# Patient Record
Sex: Female | Born: 1965 | Race: Black or African American | Hispanic: No | Smoking: Former smoker
Health system: Southern US, Community
[De-identification: ages and names within clinical notes are randomized; demographics above are authoritative.]

## PROBLEM LIST (undated history)

## (undated) DIAGNOSIS — T7840XA Allergy, unspecified, initial encounter: Secondary | ICD-10-CM

## (undated) DIAGNOSIS — D219 Benign neoplasm of connective and other soft tissue, unspecified: Secondary | ICD-10-CM

## (undated) DIAGNOSIS — F329 Major depressive disorder, single episode, unspecified: Secondary | ICD-10-CM

## (undated) DIAGNOSIS — R87619 Unspecified abnormal cytological findings in specimens from cervix uteri: Secondary | ICD-10-CM

## (undated) DIAGNOSIS — G43909 Migraine, unspecified, not intractable, without status migrainosus: Secondary | ICD-10-CM

## (undated) DIAGNOSIS — R569 Unspecified convulsions: Secondary | ICD-10-CM

## (undated) DIAGNOSIS — R011 Cardiac murmur, unspecified: Secondary | ICD-10-CM

## (undated) DIAGNOSIS — F32A Depression, unspecified: Secondary | ICD-10-CM

## (undated) DIAGNOSIS — IMO0002 Reserved for concepts with insufficient information to code with codable children: Secondary | ICD-10-CM

## (undated) DIAGNOSIS — F410 Panic disorder [episodic paroxysmal anxiety] without agoraphobia: Secondary | ICD-10-CM

## (undated) HISTORY — DX: Unspecified abnormal cytological findings in specimens from cervix uteri: R87.619

## (undated) HISTORY — DX: Cardiac murmur, unspecified: R01.1

## (undated) HISTORY — PX: APPENDECTOMY: SHX54

## (undated) HISTORY — DX: Panic disorder (episodic paroxysmal anxiety): F41.0

## (undated) HISTORY — DX: Reserved for concepts with insufficient information to code with codable children: IMO0002

## (undated) HISTORY — DX: Benign neoplasm of connective and other soft tissue, unspecified: D21.9

## (undated) HISTORY — PX: BREAST BIOPSY: SHX20

## (undated) HISTORY — DX: Major depressive disorder, single episode, unspecified: F32.9

## (undated) HISTORY — DX: Unspecified convulsions: R56.9

## (undated) HISTORY — DX: Allergy, unspecified, initial encounter: T78.40XA

## (undated) HISTORY — DX: Depression, unspecified: F32.A

## (undated) HISTORY — PX: BREAST CYST ASPIRATION: SHX578

---

## 1997-05-16 ENCOUNTER — Ambulatory Visit (HOSPITAL_BASED_OUTPATIENT_CLINIC_OR_DEPARTMENT_OTHER): Admission: RE | Admit: 1997-05-16 | Discharge: 1997-05-16 | Payer: Self-pay | Admitting: Ophthalmology

## 1999-02-20 ENCOUNTER — Encounter (INDEPENDENT_AMBULATORY_CARE_PROVIDER_SITE_OTHER): Payer: Self-pay | Admitting: Specialist

## 1999-02-20 ENCOUNTER — Other Ambulatory Visit: Admission: RE | Admit: 1999-02-20 | Discharge: 1999-02-20 | Payer: Self-pay | Admitting: Obstetrics and Gynecology

## 2000-07-07 ENCOUNTER — Other Ambulatory Visit: Admission: RE | Admit: 2000-07-07 | Discharge: 2000-07-07 | Payer: Self-pay | Admitting: Obstetrics and Gynecology

## 2002-06-04 ENCOUNTER — Other Ambulatory Visit: Admission: RE | Admit: 2002-06-04 | Discharge: 2002-06-04 | Payer: Self-pay | Admitting: Obstetrics and Gynecology

## 2003-10-18 ENCOUNTER — Other Ambulatory Visit: Admission: RE | Admit: 2003-10-18 | Discharge: 2003-10-18 | Payer: Self-pay | Admitting: Obstetrics and Gynecology

## 2003-10-21 ENCOUNTER — Encounter: Admission: RE | Admit: 2003-10-21 | Discharge: 2003-10-21 | Payer: Self-pay | Admitting: Obstetrics and Gynecology

## 2004-02-26 ENCOUNTER — Encounter: Admission: RE | Admit: 2004-02-26 | Discharge: 2004-02-26 | Payer: Self-pay | Admitting: Internal Medicine

## 2004-10-23 ENCOUNTER — Observation Stay (HOSPITAL_COMMUNITY): Admission: EM | Admit: 2004-10-23 | Discharge: 2004-10-24 | Payer: Self-pay | Admitting: Emergency Medicine

## 2004-10-23 ENCOUNTER — Ambulatory Visit: Payer: Self-pay | Admitting: Infectious Diseases

## 2004-11-06 ENCOUNTER — Other Ambulatory Visit: Admission: RE | Admit: 2004-11-06 | Discharge: 2004-11-06 | Payer: Self-pay | Admitting: Obstetrics and Gynecology

## 2005-01-12 ENCOUNTER — Encounter: Admission: RE | Admit: 2005-01-12 | Discharge: 2005-01-12 | Payer: Self-pay | Admitting: Obstetrics and Gynecology

## 2005-05-25 ENCOUNTER — Emergency Department (HOSPITAL_COMMUNITY): Admission: EM | Admit: 2005-05-25 | Discharge: 2005-05-25 | Payer: Self-pay | Admitting: Emergency Medicine

## 2005-08-24 ENCOUNTER — Emergency Department (HOSPITAL_COMMUNITY): Admission: EM | Admit: 2005-08-24 | Discharge: 2005-08-24 | Payer: Self-pay | Admitting: Emergency Medicine

## 2006-01-19 ENCOUNTER — Encounter: Admission: RE | Admit: 2006-01-19 | Discharge: 2006-01-19 | Payer: Self-pay | Admitting: Obstetrics and Gynecology

## 2006-06-20 ENCOUNTER — Emergency Department (HOSPITAL_COMMUNITY): Admission: EM | Admit: 2006-06-20 | Discharge: 2006-06-20 | Payer: Self-pay | Admitting: Emergency Medicine

## 2006-06-22 ENCOUNTER — Ambulatory Visit: Payer: Self-pay | Admitting: Family Medicine

## 2006-06-23 ENCOUNTER — Ambulatory Visit: Payer: Self-pay | Admitting: *Deleted

## 2006-08-10 ENCOUNTER — Ambulatory Visit: Payer: Self-pay | Admitting: Internal Medicine

## 2006-08-15 ENCOUNTER — Ambulatory Visit: Payer: Self-pay | Admitting: Internal Medicine

## 2006-08-23 ENCOUNTER — Encounter: Admission: RE | Admit: 2006-08-23 | Discharge: 2006-08-23 | Payer: Self-pay | Admitting: Obstetrics and Gynecology

## 2006-09-13 IMAGING — CR DG CHEST 2V
2 series · 2 of 2 positions shown · non-contrast
Comparison: none

CLINICAL DATA: Pain and tightness left shoulder with numbness. 
CHEST X-RAY:
Two views of the chest were obtained with nipple markers present.   nodular opacity wasquestioned on prior outside film at a lung base .No definite lung nodule is seen.  No active infiltrate or effusion is seen.  The heart is within normal limits in size.

[view not recorded (1 of 2)]
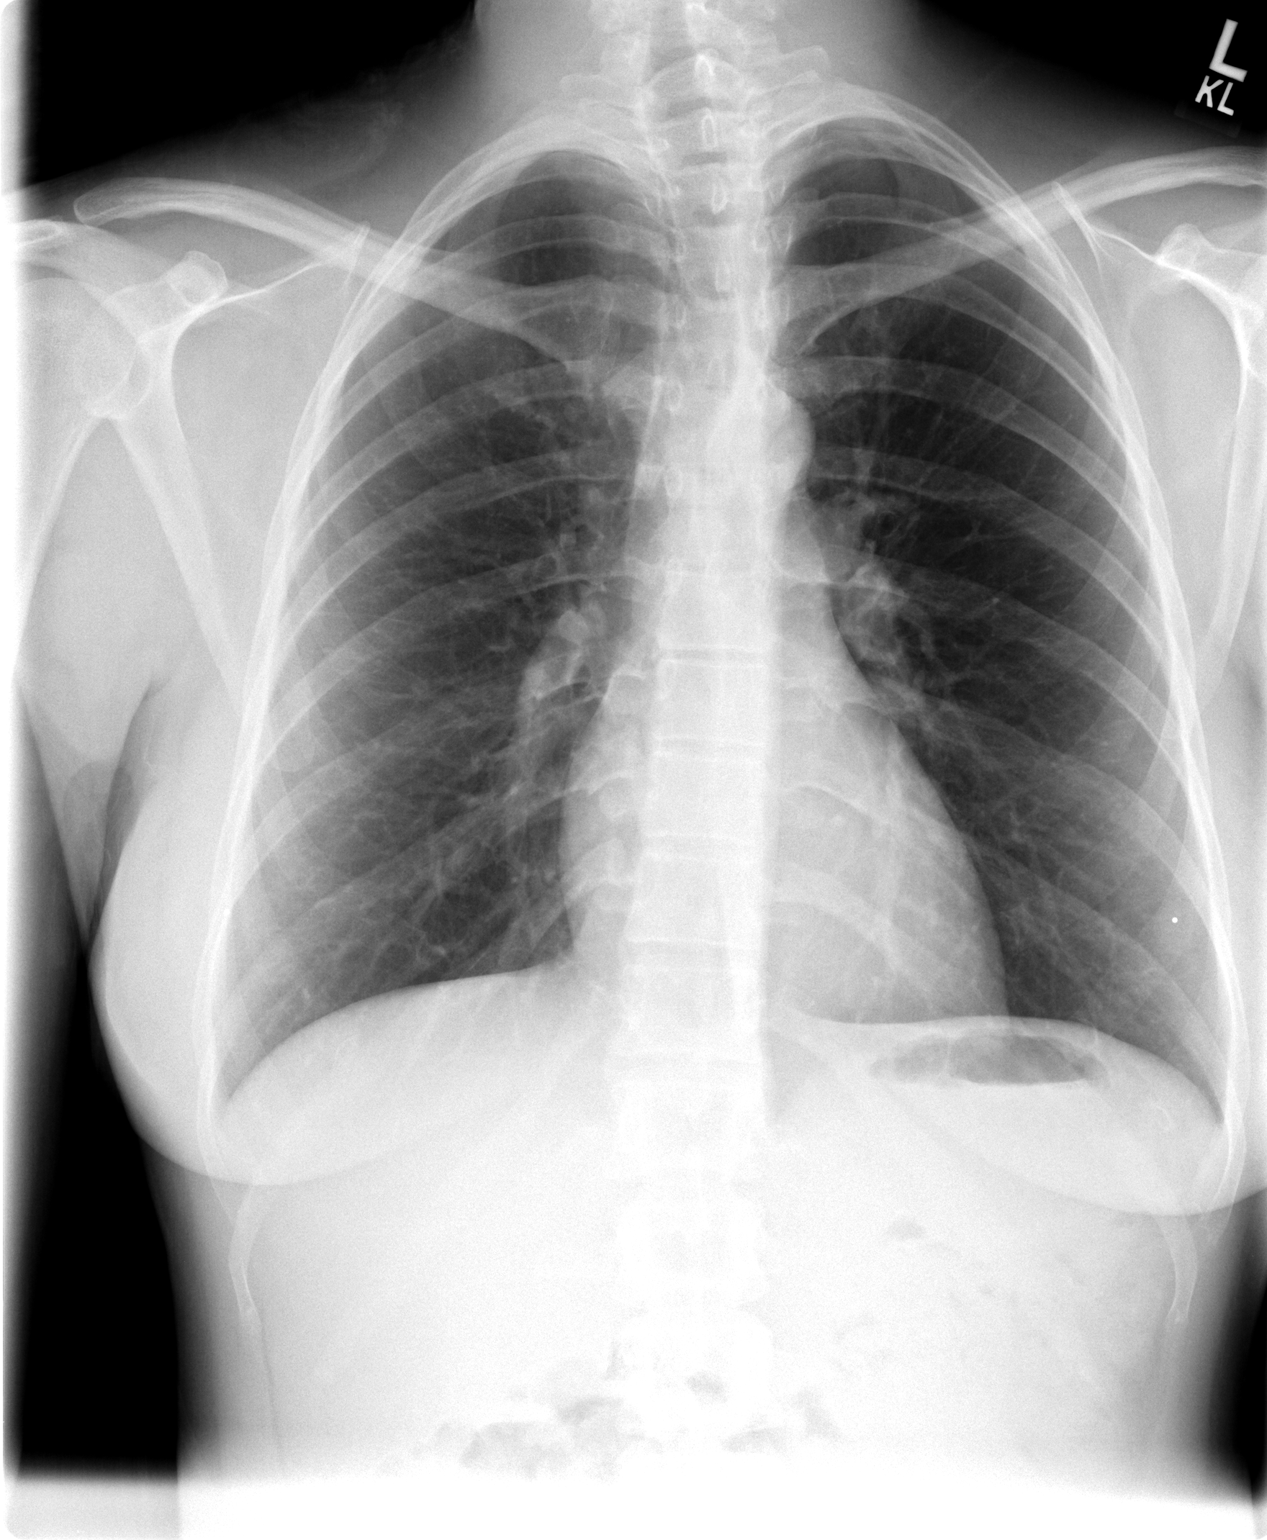

[view not recorded (2 of 2)]
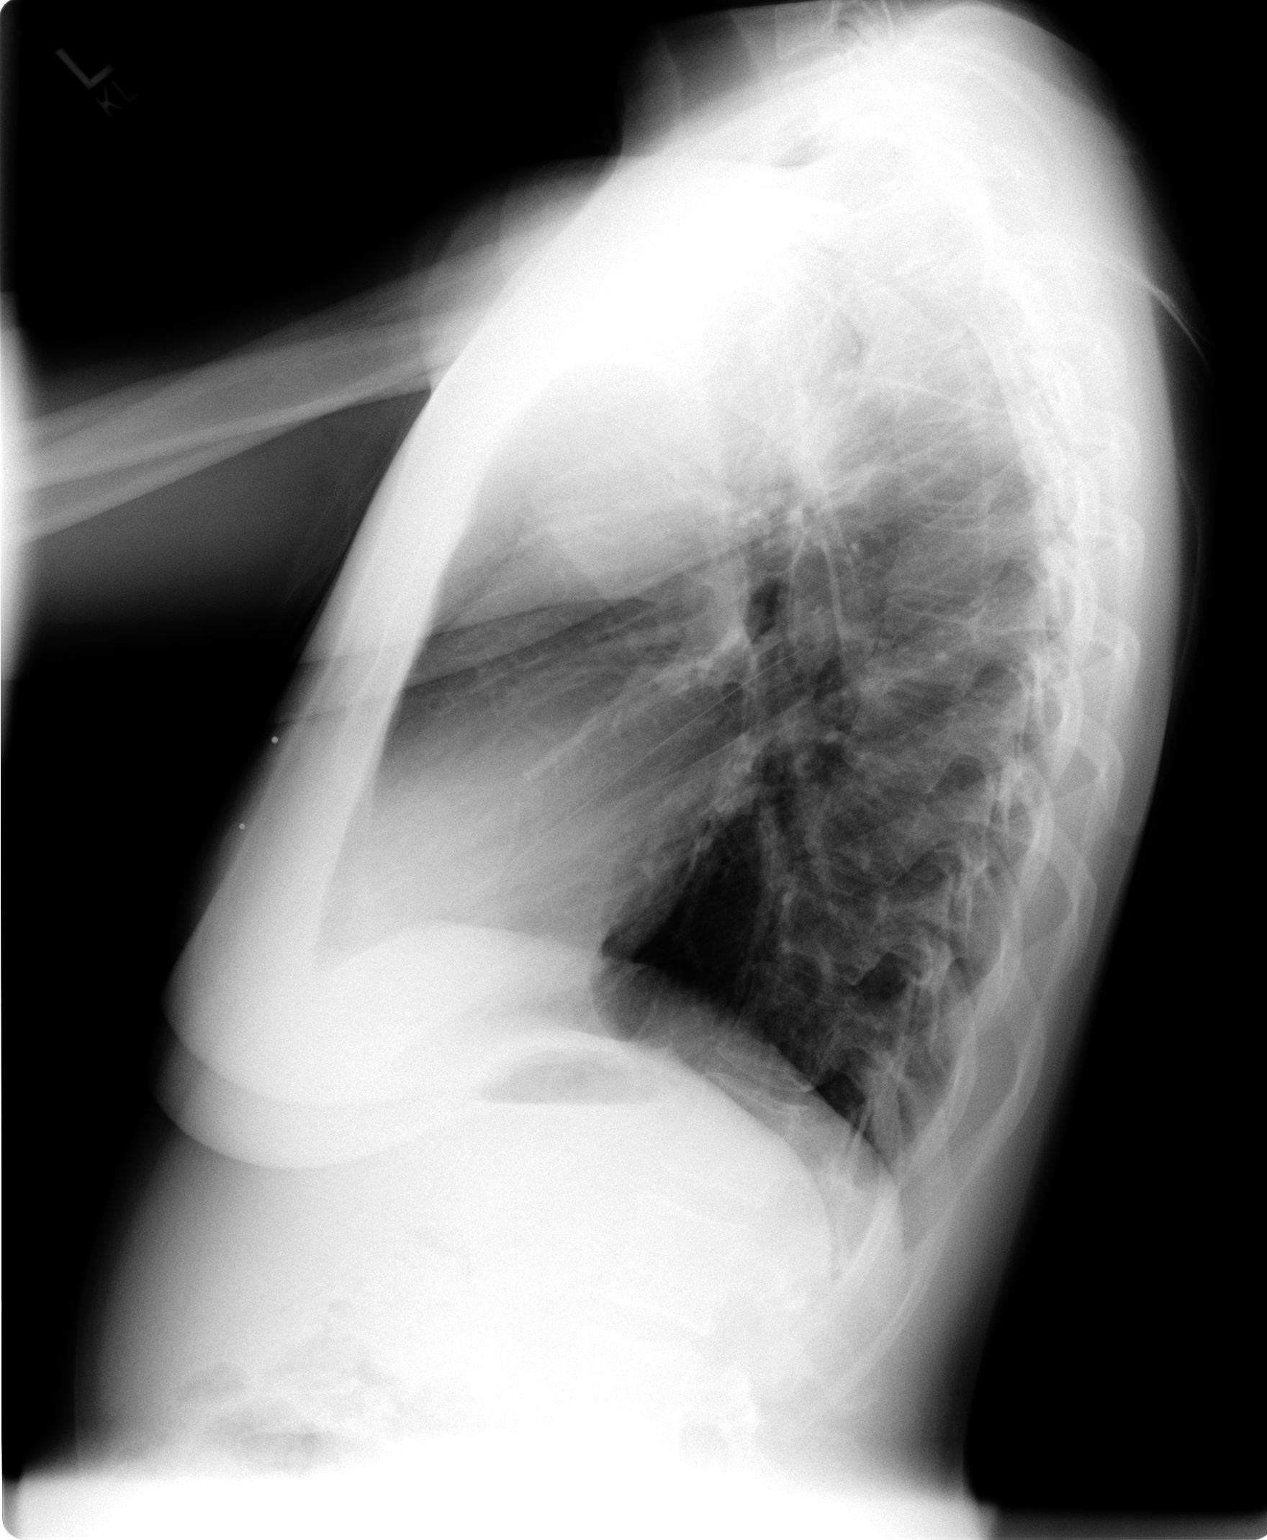

[2 of 2 positions shown; findings below may reference images not displayed]

IMPRESSION: No active lung disease.  .

## 2007-12-11 IMAGING — CT CT HEAD W/O CM
1 series · 16 of 30 positions shown, 20 images · non-contrast
Comparison: CT of the neck from 10/23/04.

CLINICAL DATA: Seizure, memory loss. 
 HEAD CT WITHOUT CONTRAST - 05/25/05:
TECHNIQUE: Contiguous axial images were obtained from the base of the skull through the vertex according to standard protocol without contrast.

[Series 2: head_seq 4.5 h45s st · axial · 0.43mm/px · z∈[-205,-79]mm · 16 of 32 slices shown, 20 images]
[im 2/32  brain]
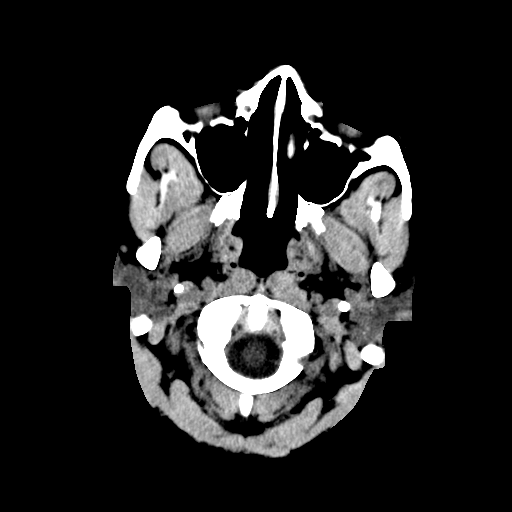
[im 2/32  bone]
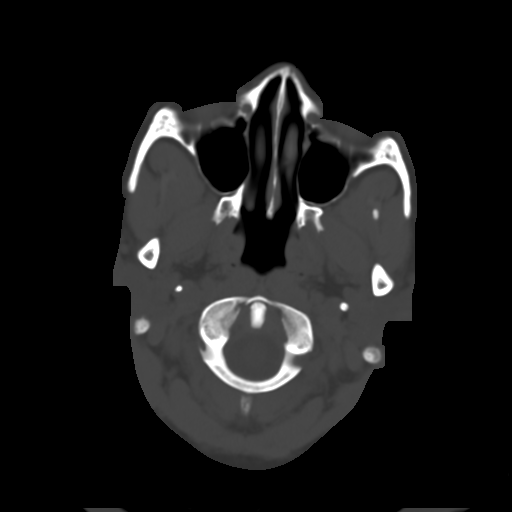
[im 4/32  brain]
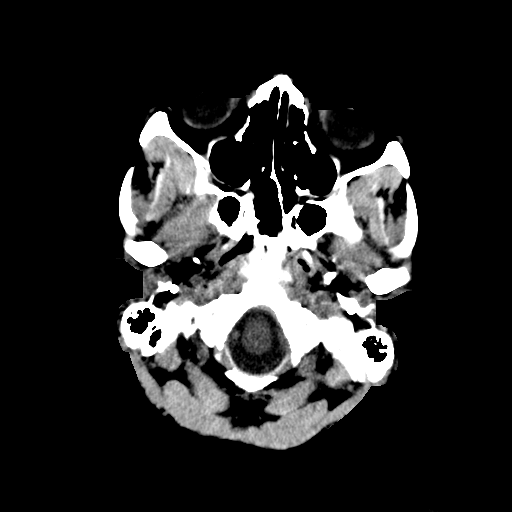
[im 6/32  brain]
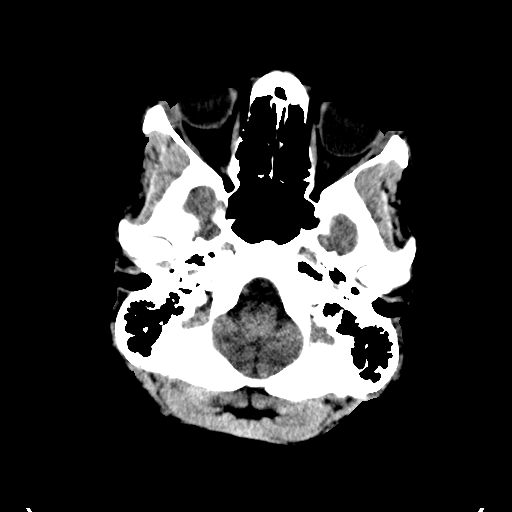
[im 8/32  brain]
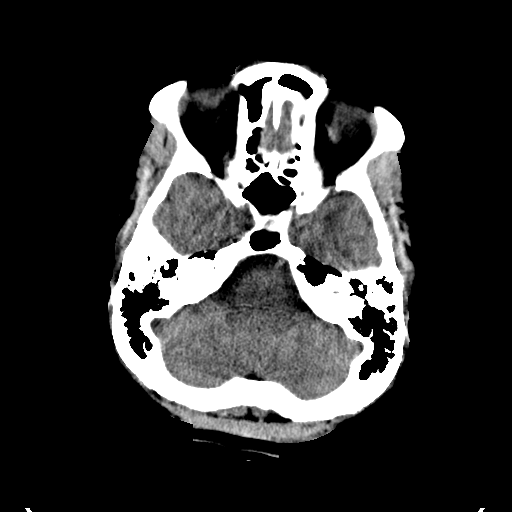
[im 9/32  brain]
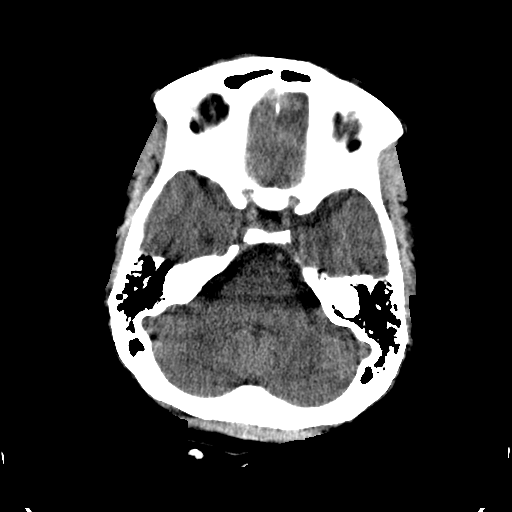
[im 9/32  bone]
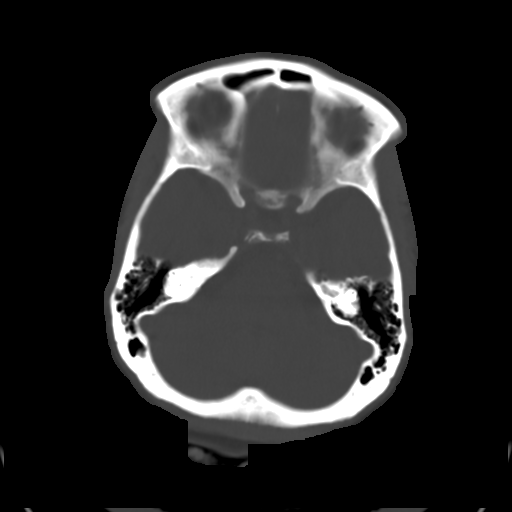
[im 11/32  brain]
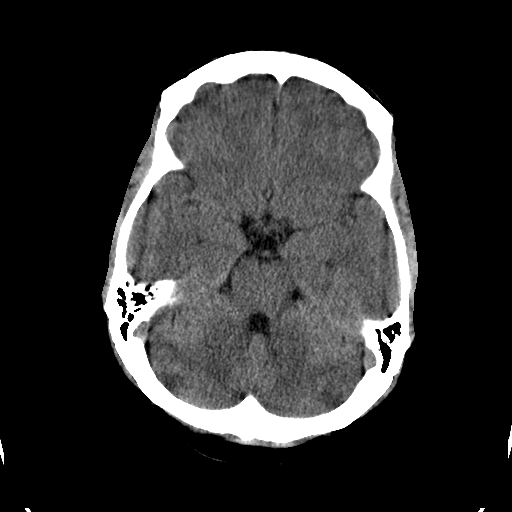
[im 13/32  brain]
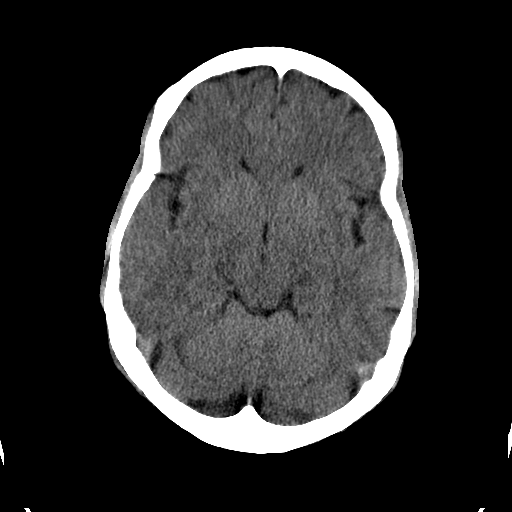
[im 15/32  brain]
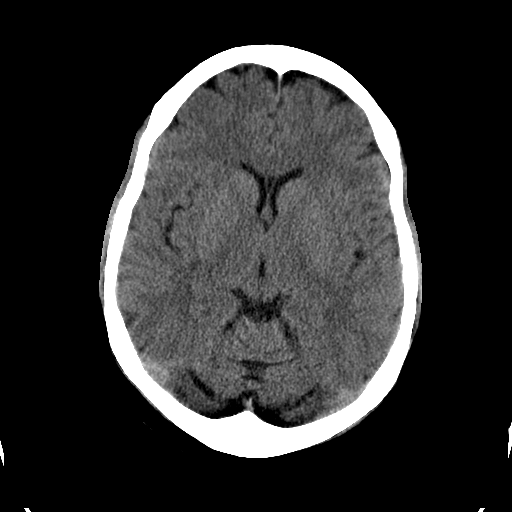
[im 17/32  brain]
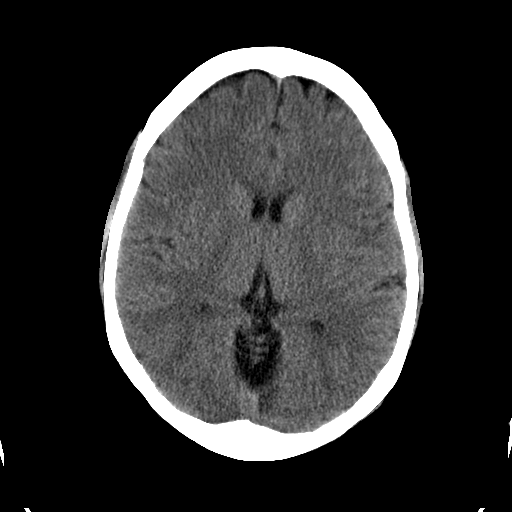
[im 17/32  bone]
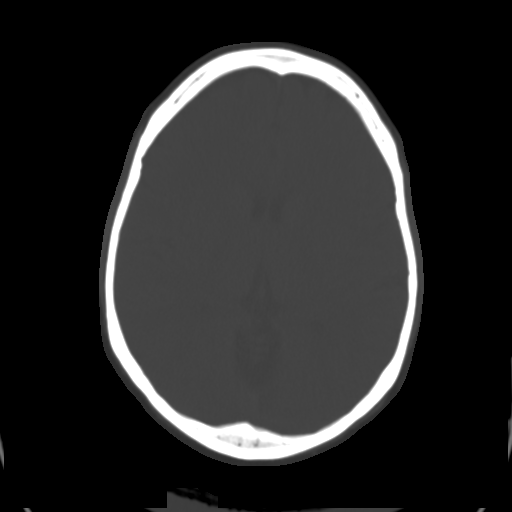
[im 19/32  brain]
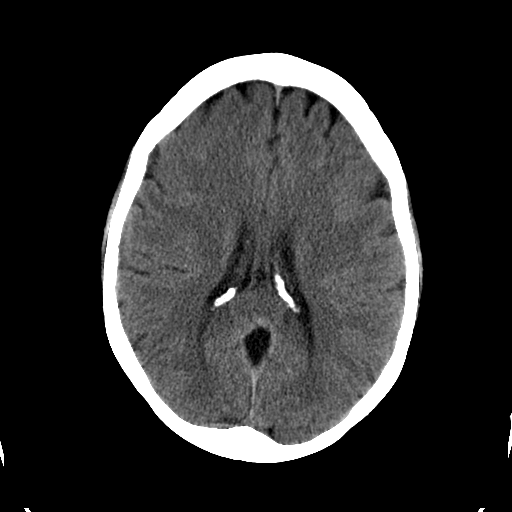
[im 21/32  brain]
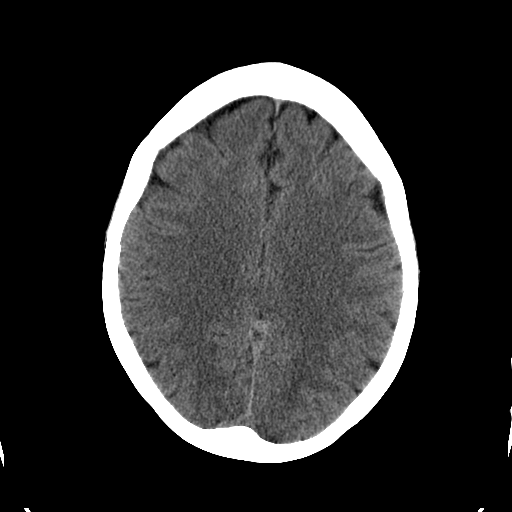
[im 23/32  brain]
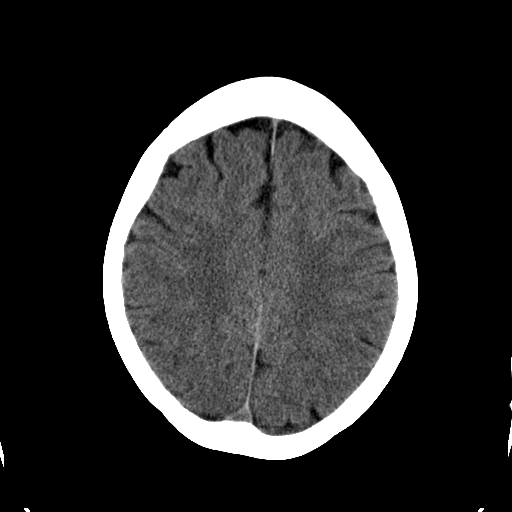
[im 24/32  brain]
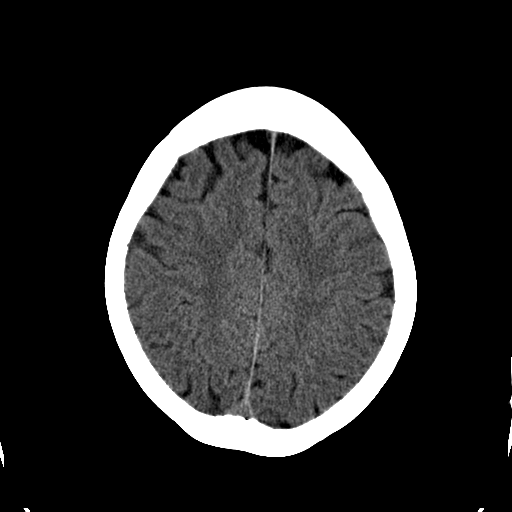
[im 24/32  bone]
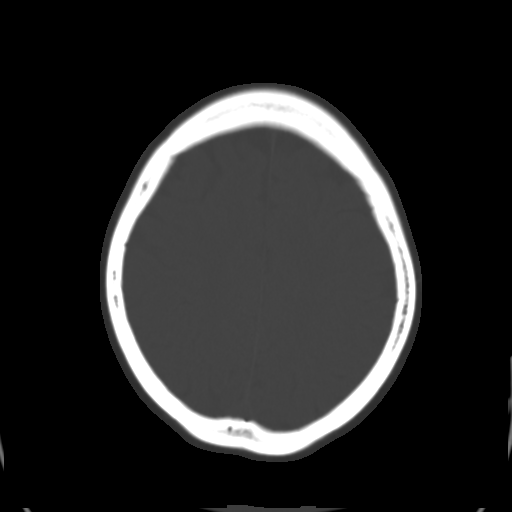
[im 26/32  brain]
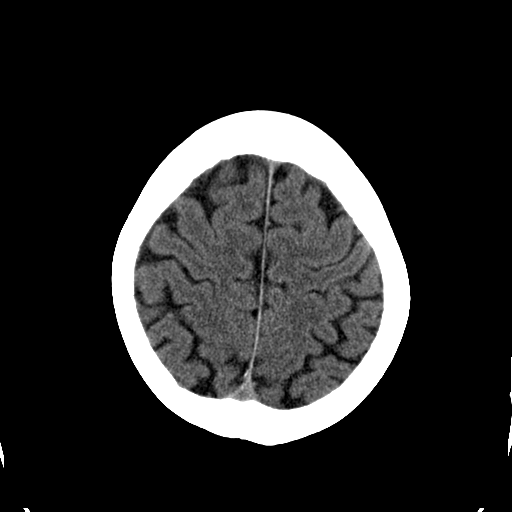
[im 28/32  brain]
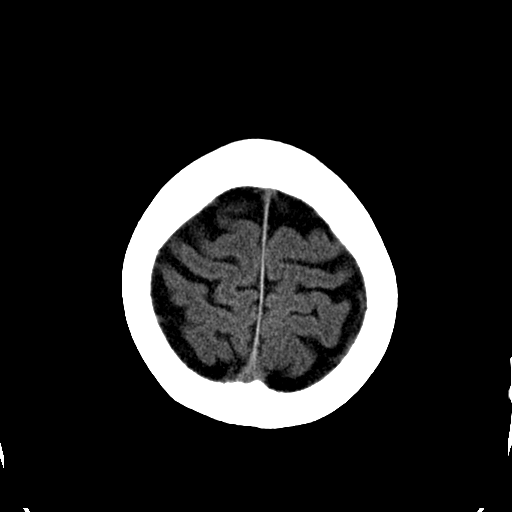
[im 30/32  brain]
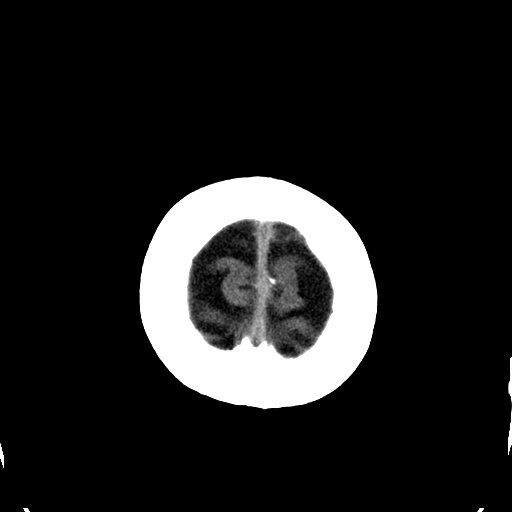

[16 of 30 positions shown; findings below may reference images not displayed]

FINDINGS: Intracranial structures appear unremarkable without visible mass or intracranial hemorrhage.  No acute intracranial findings.  There is an elongated AP diameter of the left globe, with the left globe measuring 2.7 cm in maximum AP dimension whereas the right globe measures 2.2 cm.  This is consistent with left buphophthalmos.
IMPRESSION: Left buphophthalmos.  No acute intracranial findings.

## 2009-02-02 ENCOUNTER — Emergency Department (HOSPITAL_COMMUNITY): Admission: EM | Admit: 2009-02-02 | Discharge: 2009-02-02 | Payer: Self-pay | Admitting: Emergency Medicine

## 2010-03-01 ENCOUNTER — Encounter: Payer: Self-pay | Admitting: Obstetrics and Gynecology

## 2010-05-11 LAB — CBC
HCT: 37.7 % (ref 36.0–46.0)
Hemoglobin: 12.9 g/dL (ref 12.0–15.0)
MCHC: 34.1 g/dL (ref 30.0–36.0)
MCV: 93.7 fL (ref 78.0–100.0)
Platelets: 299 10*3/uL (ref 150–400)
RBC: 4.02 MIL/uL (ref 3.87–5.11)
RDW: 13.9 % (ref 11.5–15.5)
WBC: 5.2 10*3/uL (ref 4.0–10.5)

## 2010-05-11 LAB — POCT I-STAT, CHEM 8
BUN: 12 mg/dL (ref 6–23)
Calcium, Ion: 1.2 mmol/L (ref 1.12–1.32)
Chloride: 105 mEq/L (ref 96–112)
Creatinine, Ser: 1.1 mg/dL (ref 0.4–1.2)
Glucose, Bld: 89 mg/dL (ref 70–99)
HCT: 40 % (ref 36.0–46.0)
Hemoglobin: 13.6 g/dL (ref 12.0–15.0)
Potassium: 4.1 mEq/L (ref 3.5–5.1)
Sodium: 138 mEq/L (ref 135–145)
TCO2: 28 mmol/L (ref 0–100)

## 2010-05-11 LAB — DIFFERENTIAL
Basophils Absolute: 0 10*3/uL (ref 0.0–0.1)
Basophils Relative: 1 % (ref 0–1)
Eosinophils Absolute: 0.4 10*3/uL (ref 0.0–0.7)
Eosinophils Relative: 8 % — ABNORMAL HIGH (ref 0–5)
Lymphocytes Relative: 33 % (ref 12–46)
Lymphs Abs: 1.7 10*3/uL (ref 0.7–4.0)
Monocytes Absolute: 0.6 10*3/uL (ref 0.1–1.0)
Monocytes Relative: 12 % (ref 3–12)
Neutro Abs: 2.4 10*3/uL (ref 1.7–7.7)
Neutrophils Relative %: 46 % (ref 43–77)

## 2010-05-11 LAB — D-DIMER, QUANTITATIVE: D-Dimer, Quant: 0.59 ug/mL-FEU — ABNORMAL HIGH (ref 0.00–0.48)

## 2010-05-11 LAB — POCT CARDIAC MARKERS
CKMB, poc: <1 ng/mL — ABNORMAL LOW (ref 1.0–8.0)
Myoglobin, poc: 60.3 ng/mL (ref 12–200)
Troponin i, poc: <0.05 ng/mL (ref 0.00–0.09)

## 2010-06-26 NOTE — Discharge Summary (Signed)
Claudia Elliott, Claudia Elliott                ACCOUNT NO.:  [id number]   MEDICAL RECORD NO.:  [id number]          PATIENT TYPE:  INP   LOCATION:  3020                         FACILITY:  [facility]   PHYSICIAN:  Theone Stanley, MD   DATE OF BIRTH:  [birth year]   DATE OF ADMISSION:  10/23/2004  DATE OF DISCHARGE:  10/24/2004                                 DISCHARGE SUMMARY   ADMITTING DIAGNOSES:  1.  Glossitis.  2.  Odynophagia.  3.  Pharyngitis.   DISCHARGE DIAGNOSES:  1.  Glossitis, unknown etiology.  2.  Pharyngitis.  3.  Odynophagia.   CONSULTATIONS:  Dr.  Ninetta Lights with Infectious Disease.   PROCEDURES DIAGNOSTIC TESTS:  Patient had a CT scan of the face and neck  which showed mild prominence of the right hemitongue without discrete masses  or evidence of abscess.  Slight prominent of left glossotonsillar sulcus may  reflect a palatine tonsil inflammation with minimal stranding in the left  parapharyngeal space, no pathological lymph nodes identified.   HOSPITAL COURSE:  Claudia Elliott is a very pleasant 45 year old African-American  female who presented to the ER secondary to glossitis, odynophagia and  pharyngitis.  According to the patient, she ate sushi the day before, went  to sleep and woke up with her problems.  At first, it was felt that this  might be an allergic reaction; however, her presentation, including  pharyngitis and odynophagia, was not typical of an allergic reaction.  Because of this, infectious disease was contacted.  There was a question of  scromboid; however, it was felt that this was unusual.  Also, a CT scan of  the neck was performed which did not show any abscess.  The patient was  started on Benadryl and Pepcid.  Her swelling markedly improved the next  day; however, she continued to have slight sore throat.  Patient remained  stable and she had no evidence of breathing difficulties.  Her white count  had come down to 11 and she remained afebrile.  An HIV test was  performed  and this will be followed up with Dr. Ninetta Lights. It was felt that she could be  discharged with followup with infectious disease.   DISCHARGE MEDICATIONS:  1.  Pepcid 20 mg one p.o. b.i.d. for an additional week.  2.  Benadryl 25 one p.o. q.8 hours x3 days.  3.  Magic Mouthwash 5 mL swish and swallow q.i.d. p.r.n. and she is to      resume all of her home medications.   Patient is to followup with Dr. Ninetta Lights.  The patient is to followup with  Dr. Marny Lowenstein as needed.      Theone Stanley, MD  Electronically Signed     AEJ/MEDQ  D:  10/24/2004  T:  10/25/2004  Job:  [id number]   cc:   Sharlet Salina, M.D.  [address]  [place]  [place] [address]  Fax: [phone]   Lacretia Leigh. Ninetta Lights, M.D.  [address]  [place]  [place] [address]  Fax: [phone]

## 2011-02-09 HISTORY — PX: BREAST BIOPSY: SHX20

## 2011-05-15 ENCOUNTER — Emergency Department (HOSPITAL_COMMUNITY)
Admission: EM | Admit: 2011-05-15 | Discharge: 2011-05-15 | Disposition: A | Discharge status: Home / Self Care | Payer: Self-pay | Attending: Emergency Medicine | Admitting: Emergency Medicine

## 2011-05-15 ENCOUNTER — Encounter (HOSPITAL_COMMUNITY): Payer: Self-pay | Admitting: Emergency Medicine

## 2011-05-15 DIAGNOSIS — R404 Transient alteration of awareness: Secondary | ICD-10-CM | POA: Insufficient documentation

## 2011-05-15 DIAGNOSIS — Z9119 Patient's noncompliance with other medical treatment and regimen: Secondary | ICD-10-CM

## 2011-05-15 DIAGNOSIS — R569 Unspecified convulsions: Secondary | ICD-10-CM

## 2011-05-15 DIAGNOSIS — Z91199 Patient's noncompliance with other medical treatment and regimen due to unspecified reason: Secondary | ICD-10-CM | POA: Insufficient documentation

## 2011-05-15 DIAGNOSIS — G40909 Epilepsy, unspecified, not intractable, without status epilepticus: Secondary | ICD-10-CM | POA: Insufficient documentation

## 2011-05-15 LAB — POCT I-STAT, CHEM 8
BUN: 5 mg/dL — ABNORMAL LOW (ref 6–23)
Calcium, Ion: 1.22 mmol/L (ref 1.12–1.32)
Chloride: 105 mEq/L (ref 96–112)
Creatinine, Ser: 0.8 mg/dL (ref 0.50–1.10)
Glucose, Bld: 112 mg/dL — ABNORMAL HIGH (ref 70–99)
HCT: 41 % (ref 36.0–46.0)
Hemoglobin: 13.9 g/dL (ref 12.0–15.0)
Potassium: 3.7 mEq/L (ref 3.5–5.1)
Sodium: 141 mEq/L (ref 135–145)
TCO2: 25 mmol/L (ref 0–100)

## 2011-05-15 LAB — DIFFERENTIAL
Basophils Relative: 2 % — ABNORMAL HIGH (ref 0–1)
Eosinophils Absolute: 0.2 10*3/uL (ref 0.0–0.7)
Eosinophils Relative: 4 % (ref 0–5)
Lymphocytes Relative: 26 % (ref 12–46)
Lymphs Abs: 1.2 10*3/uL (ref 0.7–4.0)
Monocytes Absolute: 0.4 10*3/uL (ref 0.1–1.0)
Monocytes Relative: 9 % (ref 3–12)
Neutro Abs: 2.8 10*3/uL (ref 1.7–7.7)

## 2011-05-15 LAB — URINALYSIS, ROUTINE W REFLEX MICROSCOPIC
Bilirubin Urine: NEGATIVE
Glucose, UA: NEGATIVE mg/dL
Hgb urine dipstick: NEGATIVE
Ketones, ur: NEGATIVE mg/dL
Leukocytes, UA: NEGATIVE
Nitrite: NEGATIVE
Protein, ur: NEGATIVE mg/dL
Specific Gravity, Urine: 1.017 (ref 1.005–1.030)
Urobilinogen, UA: 1 mg/dL (ref 0.0–1.0)
pH: 7 (ref 5.0–8.0)

## 2011-05-15 LAB — CBC
HCT: 38.5 % (ref 36.0–46.0)
Hemoglobin: 13.2 g/dL (ref 12.0–15.0)
MCH: 30.3 pg (ref 26.0–34.0)
MCV: 88.3 fL (ref 78.0–100.0)
Platelets: 390 10*3/uL (ref 150–400)
RBC: 4.36 MIL/uL (ref 3.87–5.11)
WBC: 4.7 10*3/uL (ref 4.0–10.5)

## 2011-05-15 MED ORDER — LEVETIRACETAM 750 MG PO TABS
750.0000 mg | ORAL_TABLET | Freq: Once | ORAL | Status: AC
  Administered 2011-05-15: 750 mg via ORAL
  Filled 2011-05-15: qty 1

## 2011-05-15 NOTE — ED Notes (Signed)
Pt brought by the EMS, pt, per family, had a seizure episode for about 11 min, pt at this time able to communicate and answer the questions, CBG 147, no PIV by EMS, noted dr.lood on the tongue. Pt taking Keppra for seizures, not taking as Rx,

## 2011-05-15 NOTE — ED Notes (Signed)
Pt presents to ED after having a seizure at home. Pt's son states seizure lasted "11 minutes". Pt states she has only been taking 250mg  despite being rx 1000mg  dose. Pt states she was feeling nauseous and later vomited while with EMS. Pt currently denies any discomfort.

## 2011-05-15 NOTE — ED Provider Notes (Addendum)
History     CSN: [id number]  Arrival date & time 05/15/11  0217   First MD Initiated Contact with Patient 05/15/11 [phone]      Chief Complaint  Patient presents with  . Seizures  . postictal     (Consider location/radiation/quality/duration/timing/severity/associated sxs/prior treatment) HPI Comments: Patient has a known seizure disorder.  She specifically thousand milligrams of To a day.  Due to financial constraints has only been taking 250 mg a day.  This is the first breakthrough, seizure.  She's had in quite a while.  She went to bed fine, woke up with her family, standing around her.  She is now back to her baseline.  She states that her seizures only occur at night while she is sleeping in her bed.  They do not occur during the day while she was napping.  She has had numerous sleep studies and the physicians have been unable to ascertain why she has nocturnal sleep induced seizures.   Patient is a 46 y.o. female presenting with seizures. The history is provided by the patient.  Seizures  This is a recurrent problem. The current episode started 1 to 2 hours ago. The problem has been resolved. Characteristics include rhythmic jerking, loss of consciousness and bit tongue.    History reviewed. No pertinent past medical history.  History reviewed. No pertinent past surgical history.  History reviewed. No pertinent family history.  History  Substance Use Topics  . Smoking status: Not on file  . Smokeless tobacco: Not on file  . Alcohol Use: Not on file    OB History    Grav Para Term Preterm Abortions TAB SAB Ect Mult Living                  Review of Systems  Neurological: Positive for seizures and loss of consciousness.    Allergies  Review of patient's allergies indicates no known allergies.  Home Medications   Current Outpatient Rx  Name Route Sig Dispense Refill  . LEVETIRACETAM 500 MG PO TABS Oral Take 250 mg by mouth daily.    Marland Kitchen PRESCRIPTION MEDICATION  Vaginal Place 500 mg vaginally. Boric Acid for yeast infections vaginal route    . RANITIDINE HCL 300 MG PO TABS Oral Take 300 mg by mouth at bedtime.      BP 135/80  Pulse 83  Temp(Src) 98.1 F (36.7 C) (Oral)  Resp 14  SpO2 100%  Physical Exam  Constitutional: She is oriented to person, place, and time. She appears well-developed and well-nourished.  HENT:  Head: Normocephalic.  Eyes: Pupils are equal, round, and reactive to light.  Neck: Normal range of motion.  Cardiovascular: Normal rate.   Pulmonary/Chest: Effort normal.  Musculoskeletal: Normal range of motion.  Neurological: She is alert and oriented to person, place, and time.  Skin: Skin is warm.    ED Course  Procedures (including critical care time)  Labs Reviewed  DIFFERENTIAL - Abnormal; Notable for the following:    Basophils Relative 2 (*)    All other components within normal limits  POCT I-STAT, CHEM 8 - Abnormal; Notable for the following:    BUN 5 (*)    Glucose, Bld 112 (*)    All other components within normal limits  CBC  URINALYSIS, ROUTINE W REFLEX MICROSCOPIC   No results found.   1. Medically noncompliant   2. Seizures     Patient is at her baseline mental status Patient states she has Route him does not  need a new prescription, but the cost has been prohibitive with taking thousand milligrams daily.  Costing over $300 a month, so she has been taking 250 mg to save money  MDM  Subtherapeutic Keppra levels as the most likely source of her seizures, plus increased stress.  Will supplement her 250 mg with an additional 750 mg to a total of 1000 mg of Keppra.  We've discussed this at length and patient feels that financially she can increase to perhaps 500 mg a day.        Arman Filter, NP 05/15/11 0516  Arman Filter, NP 05/15/11 1610  Arman Filter, NP 07/01/11 2014

## 2011-05-15 NOTE — ED Notes (Signed)
[id number]<AV> Expected date:<BR> Expected time:<BR> Means of arrival:<BR> Comments:<BR> Post ictal

## 2011-05-15 NOTE — ED Provider Notes (Signed)
Medical screening examination/treatment/procedure(s) were performed by non-physician practitioner and as supervising physician I was immediately available for consultation/collaboration.   Kaavya Puskarich, MD 05/15/11 2112 

## 2011-05-15 NOTE — Discharge Instructions (Signed)
Epilepsy People with epilepsy have times when they shake and jerk uncontrollably (seizures). This happens when there is a sudden change in brain function. Epilepsy may have many possible causes. Anything that disturbs the normal pattern of brain cell activity can lead to seizures. HOME CARE   Listen to your doctor about driving and safety during normal activities.   Only take medicine as told by your doctor.   Take blood tests as told by your doctor.   Tell the people you live and work with that you have seizures. Make sure they know how to help you. They should:   Cushion your head and body.   Turn you on your side.   Not restrain you.   Not place anything inside your mouth.   Call for local emergency medical help if there is any question about what has happened.   Write down when your seizures happen and what you remember about each seizure. Write down anything you think may have caused the seizure to happen (trigger).   Keep all follow-up visits with your doctor. This is very important.  GET HELP RIGHT AWAY IF:   You get an infection or start to feel sick. You may have more seizures when you are sick.   You are having seizures more often.   Your seizure pattern is changing.   A seizure does not stop after a few seconds or minutes.   A seizure causes you to have trouble breathing.   A seizure gives you a very bad headache.   A seizure makes you unable to speak or use a part of your body.  MAKE SURE YOU:   Understand these instructions.   Will watch your condition.   Will get help right away if you are not doing well or get worse.  Document Released: 11/22/2008 Document Revised: 01/14/2011 Document Reviewed: 11/22/2008 [facility] Patient Information 2012 [place], [place]. As discussed tried taking 500 mg of Keppra to see if this prevents breakthrough seizures

## 2011-05-20 ENCOUNTER — Other Ambulatory Visit (HOSPITAL_COMMUNITY): Payer: Self-pay | Admitting: Obstetrics and Gynecology

## 2011-06-15 ENCOUNTER — Ambulatory Visit (INDEPENDENT_AMBULATORY_CARE_PROVIDER_SITE_OTHER): Payer: Self-pay | Admitting: *Deleted

## 2011-06-15 ENCOUNTER — Other Ambulatory Visit: Payer: Self-pay | Admitting: Obstetrics and Gynecology

## 2011-06-15 VITALS — BP 131/90 | HR 63 | Temp 98.3°F | Ht 66.5 in | Wt 149.9 lb

## 2011-06-15 DIAGNOSIS — N6452 Nipple discharge: Secondary | ICD-10-CM

## 2011-06-15 DIAGNOSIS — N6459 Other signs and symptoms in breast: Secondary | ICD-10-CM

## 2011-06-15 DIAGNOSIS — Z01419 Encounter for gynecological examination (general) (routine) without abnormal findings: Secondary | ICD-10-CM

## 2011-06-15 NOTE — Patient Instructions (Signed)
Taught patient how to perform BSE. Patient referred to the [facility] for bilateral diagnostic mammogram. Appointment scheduled for Friday, Jun 18, 2011 at 0950.  Patient aware of appointment and will be there. Let patient know that follow up for today's Pap smear will depend on the result due to her history of abnormal Pap smears. Let patient know will follow up with her within the next couple weeks with results. Patient verbalized understanding.

## 2011-06-15 NOTE — Progress Notes (Signed)
Complaints of bilateral nipple discharge for past 8 years.  Pap Smear:    Pap smear completed today. Per patient last Pap smear was May 2011 and normal. Per patient has a history of 2-4 abnormal Pap smears that required repeat Pap smears. No Pap smear results in EPIC.  Physical exam: Breasts Breasts symmetrical. No skin abnormalities bilateral breasts. No nipple retraction bilateral breasts. Olive green colored bilateral nipple discharge per patient has had 8 years. Sample of discharge sent to cytology. No lymphadenopathy. No lumps palpated right breast. Palpated two lumps in left breast at 7 o'clock 2 cm from the nipple and at 4 o'clock 3 cm from the areola. No complaints of pain or tenderness on exam. Patient referred to the [facility] for bilateral diagnostic mammogram. Appointment scheduled for Friday, Jun 18, 2011 at 0950.         Pelvic/Bimanual   Ext Genitalia No lesions, no swelling and bloody discharge associated with menstruation observed on external genitalia.         Vagina Vagina pink and normal texture. No lesions and bloody discharge associated with menstruation observed in vagina.          Cervix Cervix is present. Cervix pink and of normal texture. Bloody discharge associated with menstruation observed on cervix.     Uterus Uterus is present and palpable. Uterus in normal position and normal size.        Adnexae Bilateral ovaries present and palpable. No tenderness on palpation.          Rectovaginal No rectal exam completed today since patient had no rectal complaints. No skin abnormalities observed on exam.

## 2011-06-18 ENCOUNTER — Other Ambulatory Visit: Payer: Self-pay

## 2011-06-22 ENCOUNTER — Other Ambulatory Visit: Payer: Self-pay | Admitting: Obstetrics and Gynecology

## 2011-06-22 ENCOUNTER — Ambulatory Visit
Admission: RE | Admit: 2011-06-22 | Discharge: 2011-06-22 | Disposition: A | Discharge status: Home / Self Care | Payer: No Typology Code available for payment source | Source: Ambulatory Visit | Attending: Obstetrics and Gynecology | Admitting: Obstetrics and Gynecology

## 2011-06-22 DIAGNOSIS — N6452 Nipple discharge: Secondary | ICD-10-CM

## 2011-06-24 ENCOUNTER — Encounter: Payer: Self-pay | Admitting: Obstetrics and Gynecology

## 2011-06-28 ENCOUNTER — Ambulatory Visit
Admission: RE | Admit: 2011-06-28 | Discharge: 2011-06-28 | Disposition: A | Discharge status: Home / Self Care | Payer: No Typology Code available for payment source | Source: Ambulatory Visit | Attending: Obstetrics and Gynecology | Admitting: Obstetrics and Gynecology

## 2011-06-28 ENCOUNTER — Other Ambulatory Visit: Payer: Self-pay | Admitting: Obstetrics and Gynecology

## 2011-06-28 DIAGNOSIS — N6452 Nipple discharge: Secondary | ICD-10-CM

## 2011-07-04 NOTE — ED Provider Notes (Signed)
Medical screening examination/treatment/procedure(s) were performed by non-physician practitioner and as supervising physician I was immediately available for consultation/collaboration.   Gwyneth Sprout, MD 07/04/11 [phone]

## 2011-07-09 ENCOUNTER — Telehealth: Payer: Self-pay | Admitting: *Deleted

## 2011-07-09 NOTE — Telephone Encounter (Signed)
Patient returned call to office and advised patient of normal pap smear results and benign mammogram. Patient states breast center advised her to follow up in one year.

## 2011-07-09 NOTE — Telephone Encounter (Signed)
Telephoned patient at home # left message to return call to [phone]

## 2011-09-28 ENCOUNTER — Emergency Department (HOSPITAL_COMMUNITY)
Admission: EM | Admit: 2011-09-28 | Discharge: 2011-09-28 | Disposition: A | Discharge status: Home / Self Care | Payer: Self-pay | Attending: Emergency Medicine | Admitting: Emergency Medicine

## 2011-09-28 ENCOUNTER — Emergency Department (HOSPITAL_COMMUNITY): Payer: Self-pay

## 2011-09-28 ENCOUNTER — Encounter (HOSPITAL_COMMUNITY): Payer: Self-pay | Admitting: *Deleted

## 2011-09-28 DIAGNOSIS — R569 Unspecified convulsions: Secondary | ICD-10-CM | POA: Insufficient documentation

## 2011-09-28 DIAGNOSIS — I10 Essential (primary) hypertension: Secondary | ICD-10-CM | POA: Insufficient documentation

## 2011-09-28 DIAGNOSIS — W06XXXA Fall from bed, initial encounter: Secondary | ICD-10-CM | POA: Insufficient documentation

## 2011-09-28 DIAGNOSIS — S0003XA Contusion of scalp, initial encounter: Secondary | ICD-10-CM | POA: Insufficient documentation

## 2011-09-28 DIAGNOSIS — R51 Headache: Secondary | ICD-10-CM | POA: Insufficient documentation

## 2011-09-28 DIAGNOSIS — Z79899 Other long term (current) drug therapy: Secondary | ICD-10-CM | POA: Insufficient documentation

## 2011-09-28 DIAGNOSIS — S0083XA Contusion of other part of head, initial encounter: Secondary | ICD-10-CM

## 2011-09-28 MED ORDER — SODIUM CHLORIDE 0.9 % IV SOLN
1000.0000 mg | Freq: Once | INTRAVENOUS | Status: AC
  Administered 2011-09-28: 1000 mg via INTRAVENOUS
  Filled 2011-09-28: qty 10

## 2011-09-28 MED ORDER — HYDROCODONE-ACETAMINOPHEN 5-325 MG PO TABS: 1.0000 | ORAL_TABLET | ORAL | Status: AC | PRN

## 2011-09-28 MED ORDER — OXYCODONE-ACETAMINOPHEN 5-325 MG PO TABS
1.0000 | ORAL_TABLET | Freq: Once | ORAL | Status: AC
  Administered 2011-09-28: 1 via ORAL
  Filled 2011-09-28: qty 1

## 2011-09-28 NOTE — ED Notes (Signed)
Per EMS:  Family st's pt had 2 seizures, both were far apart, both while she was asleep.  Pt was alert and oriented between the 2 seizures.  Pt rolled off the bed but didn't fall to the floor.  Very small lac present under R eye, family st's she must have hit her face on the dresser while seizing.  Pt is alert and oriented now.  Pt is on keppra and has been taking it as directed.  Resp even and unlabored.

## 2011-09-28 NOTE — ED Notes (Signed)
[id number]<WG> Expected date:<BR> Expected time:<BR> Means of arrival:<BR> Comments:<BR> Seizure x 2

## 2011-09-28 NOTE — ED Notes (Signed)
Claudia Elliott (Husband):  [phone]

## 2011-09-28 NOTE — ED Provider Notes (Signed)
History     CSN: [id number]  Arrival date & time 09/28/11  0312   First MD Initiated Contact with Patient 09/28/11 [phone]      Chief Complaint  Patient presents with  . Seizures    HPI Patient reports having 2 seizures this evening.  They both were while she was in bed.  The last one resulted in her falling out of bed and developing a contusion of her right paravertebral area.  She has no laceration at this time.  She reports mild headache.  She denies malocclusion or trismus.  She's had no fevers or chills.  She denies neck stiffness.  She has a history of seizures for which he takes Keppra 500 mg twice a day.  She reports she's been compliant with her medications.  She has no complaints at this time.  She denies weakness in her upper lower extremities.   Past Medical History  Diagnosis Date  . Seizures   . Abnormal Pap smear     repeat pap  . Heart murmur   . Hypertension   . Anemia     Past Surgical History  Procedure Date  . Appendectomy     Family History  Problem Relation Age of Onset  . Diabetes Maternal Grandmother   . Hypertension Maternal Grandmother   . Hypertension Mother     History  Substance Use Topics  . Smoking status: Never Smoker   . Smokeless tobacco: Never Used  . Alcohol Use: 1.2 oz/week    2 Glasses of wine per week    OB History    Grav Para Term Preterm Abortions TAB SAB Ect Mult Living   4 1 1  3 3    1       Review of Systems  All other systems reviewed and are negative.    Allergies  Review of patient's allergies indicates no known allergies.  Home Medications   Current Outpatient Rx  Name Route Sig Dispense Refill  . LEVETIRACETAM 500 MG PO TABS Oral Take 1,000 mg by mouth daily.     Marland Kitchen RANITIDINE HCL 300 MG PO TABS Oral Take 300 mg by mouth at bedtime.      BP 126/75  Pulse 86  Temp 98.7 F (37.1 C) (Oral)  Resp 16  SpO2 100%  LMP 09/20/2011  Physical Exam  Nursing note and vitals reviewed. Constitutional: She is  oriented to person, place, and time. She appears well-developed and well-nourished. No distress.  HENT:  Head: Normocephalic and atraumatic.       Periorbital swelling on right. EOM intact  Eyes: EOM are normal. Pupils are equal, round, and reactive to light.  Neck: Normal range of motion.  Cardiovascular: Normal rate, regular rhythm and normal heart sounds.   Pulmonary/Chest: Effort normal and breath sounds normal.  Abdominal: Soft. She exhibits no distension. There is no tenderness.  Musculoskeletal: Normal range of motion.  Neurological: She is alert and oriented to person, place, and time.       5/5 strength in major muscle groups of  bilateral upper and lower extremities. Speech normal. No facial asymetry.   Skin: Skin is warm and dry.  Psychiatric: She has a normal mood and affect. Judgment normal.    ED Course  Procedures (including critical care time)  Labs Reviewed - No data to display Ct Head Wo Contrast  09/28/2011  *RADIOLOGY REPORT*  Clinical Data: Two seizures and fall.  Laceration under the right eye.  CT HEAD WITHOUT CONTRAST CT  MAXILLOFACIAL WITHOUT CONTRAST  Technique:  Multidetector CT imaging of the head and maxillofacial structures were performed using the standard protocol without intravenous contrast. Multiplanar CT image reconstructions of the maxillofacial structures were also generated.  Comparison:   None.  CT HEAD  Findings: The ventricles and sulci are symmetrical without significant effacement, displacement, or dilatation. No mass effect or midline shift. No abnormal extra-axial fluid collections. The grey-white matter junction is distinct. Basal cisterns are not effaced. No acute intracranial hemorrhage. No depressed skull fractures.  Visualized mastoid air cells are not opacified.  No significant change since previous study.  IMPRESSION:  No acute intracranial abnormalities.  CT MAXILLOFACIAL  Findings:   Asymmetry in the globes with elongated the left globe,  stable since previous study.  Globes and extraocular muscles are otherwise intact and symmetrical.  There is retention cyst in the right maxillary antrum. Opacification of a left-sided ethmoid air cell.  No acute air-fluid levels in the paranasal sinuses.  Small right periorbital soft tissue swelling/hematoma.  No retrobulbar involvement.  The orbital rims, nasal bones, nasal septum, nasal spine, maxillary antral walls, pterygoid plates, zygomatic arches, mandibles, and temporomandibular joints appear intact and symmetrical.  No displaced fractures are appreciated.  IMPRESSION: No displaced orbital or facial fractures identified.  Mild inflammatory changes in the paranasal sinuses.  Chronic asymmetry of the left globe as discussed.   Original Report Authenticated By: Marlon Pel, M.D.    Ct Maxillofacial Wo Cm  09/28/2011  *RADIOLOGY REPORT*  Clinical Data: Two seizures and fall.  Laceration under the right eye.  CT HEAD WITHOUT CONTRAST CT MAXILLOFACIAL WITHOUT CONTRAST  Technique:  Multidetector CT imaging of the head and maxillofacial structures were performed using the standard protocol without intravenous contrast. Multiplanar CT image reconstructions of the maxillofacial structures were also generated.  Comparison:   None.  CT HEAD  Findings: The ventricles and sulci are symmetrical without significant effacement, displacement, or dilatation. No mass effect or midline shift. No abnormal extra-axial fluid collections. The grey-white matter junction is distinct. Basal cisterns are not effaced. No acute intracranial hemorrhage. No depressed skull fractures.  Visualized mastoid air cells are not opacified.  No significant change since previous study.  IMPRESSION:  No acute intracranial abnormalities.  CT MAXILLOFACIAL  Findings:   Asymmetry in the globes with elongated the left globe, stable since previous study.  Globes and extraocular muscles are otherwise intact and symmetrical.  There is retention  cyst in the right maxillary antrum. Opacification of a left-sided ethmoid air cell.  No acute air-fluid levels in the paranasal sinuses.  Small right periorbital soft tissue swelling/hematoma.  No retrobulbar involvement.  The orbital rims, nasal bones, nasal septum, nasal spine, maxillary antral walls, pterygoid plates, zygomatic arches, mandibles, and temporomandibular joints appear intact and symmetrical.  No displaced fractures are appreciated.  IMPRESSION: No displaced orbital or facial fractures identified.  Mild inflammatory changes in the paranasal sinuses.  Chronic asymmetry of the left globe as discussed.   Original Report Authenticated By: Marlon Pel, M.D.     I personally reviewed the imaging tests through PACS system  I reviewed available ER/hospitalization records thought the EMR   1. Seizure   2. Contusion of face       MDM  I suspect this a breakthrough seizure.  Patient loaded with IV Keppra. Dc home with neurology follow up        Lyanne Co, MD 09/28/11 [phone]

## 2012-05-24 ENCOUNTER — Telehealth: Payer: Self-pay

## 2012-05-24 MED ORDER — LEVETIRACETAM 500 MG PO TABS: 500.0000 mg | ORAL_TABLET | Freq: Two times a day (BID) | ORAL | Status: DC

## 2012-05-24 NOTE — Telephone Encounter (Signed)
Pt requesting follow up appt for med management. Sched appt for 06/02/12, requesting to have Keppra refilled until then.

## 2012-06-02 ENCOUNTER — Ambulatory Visit: Payer: Self-pay | Admitting: Diagnostic Neuroimaging

## 2012-06-28 ENCOUNTER — Emergency Department (HOSPITAL_COMMUNITY)
Admission: EM | Admit: 2012-06-28 | Discharge: 2012-06-28 | Disposition: A | Discharge status: Home / Self Care | Payer: No Typology Code available for payment source | Attending: Emergency Medicine | Admitting: Emergency Medicine

## 2012-06-28 ENCOUNTER — Emergency Department (HOSPITAL_COMMUNITY): Payer: No Typology Code available for payment source

## 2012-06-28 ENCOUNTER — Encounter (HOSPITAL_COMMUNITY): Payer: Self-pay | Admitting: Emergency Medicine

## 2012-06-28 DIAGNOSIS — S139XXA Sprain of joints and ligaments of unspecified parts of neck, initial encounter: Secondary | ICD-10-CM | POA: Insufficient documentation

## 2012-06-28 DIAGNOSIS — S0990XA Unspecified injury of head, initial encounter: Secondary | ICD-10-CM

## 2012-06-28 DIAGNOSIS — I10 Essential (primary) hypertension: Secondary | ICD-10-CM | POA: Insufficient documentation

## 2012-06-28 DIAGNOSIS — Y92410 Unspecified street and highway as the place of occurrence of the external cause: Secondary | ICD-10-CM | POA: Insufficient documentation

## 2012-06-28 DIAGNOSIS — G40909 Epilepsy, unspecified, not intractable, without status epilepticus: Secondary | ICD-10-CM | POA: Insufficient documentation

## 2012-06-28 DIAGNOSIS — S161XXA Strain of muscle, fascia and tendon at neck level, initial encounter: Secondary | ICD-10-CM

## 2012-06-28 DIAGNOSIS — R011 Cardiac murmur, unspecified: Secondary | ICD-10-CM | POA: Insufficient documentation

## 2012-06-28 DIAGNOSIS — Z862 Personal history of diseases of the blood and blood-forming organs and certain disorders involving the immune mechanism: Secondary | ICD-10-CM | POA: Insufficient documentation

## 2012-06-28 DIAGNOSIS — Y9389 Activity, other specified: Secondary | ICD-10-CM | POA: Insufficient documentation

## 2012-06-28 DIAGNOSIS — S298XXA Other specified injuries of thorax, initial encounter: Secondary | ICD-10-CM | POA: Insufficient documentation

## 2012-06-28 LAB — CBC
MCH: 30.3 pg (ref 26.0–34.0)
MCHC: 34.4 g/dL (ref 30.0–36.0)
Platelets: 359 10*3/uL (ref 150–400)

## 2012-06-28 LAB — POCT I-STAT TROPONIN I: Troponin i, poc: 0 ng/mL (ref 0.00–0.08)

## 2012-06-28 LAB — BASIC METABOLIC PANEL
Calcium: 9.3 mg/dL (ref 8.4–10.5)
GFR calc non Af Amer: 84 mL/min — ABNORMAL LOW (ref >90)
Glucose, Bld: 83 mg/dL (ref 70–99)
Sodium: 137 mEq/L (ref 135–145)

## 2012-06-28 MED ORDER — DIPHENHYDRAMINE HCL 25 MG PO CAPS
25.0000 mg | ORAL_CAPSULE | Freq: Once | ORAL | Status: AC
  Administered 2012-06-28: 25 mg via ORAL
  Filled 2012-06-28: qty 1

## 2012-06-28 MED ORDER — HYDROCODONE-ACETAMINOPHEN 5-325 MG PO TABS: 2.0000 | ORAL_TABLET | ORAL | Status: DC | PRN

## 2012-06-28 MED ORDER — OXYCODONE-ACETAMINOPHEN 5-325 MG PO TABS
2.0000 | ORAL_TABLET | Freq: Once | ORAL | Status: AC
  Administered 2012-06-28: 2 via ORAL
  Filled 2012-06-28: qty 2

## 2012-06-28 MED ORDER — NAPROXEN 500 MG PO TABS: 500.0000 mg | ORAL_TABLET | Freq: Two times a day (BID) | ORAL | Status: DC

## 2012-06-28 NOTE — ED Notes (Signed)
Pt. Has had two attempt for labs no blood return.called lab tech to stick.

## 2012-06-28 NOTE — ED Notes (Signed)
[place] collar applied to pt
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
## 2012-06-28 NOTE — ED Notes (Signed)
PT NEEDS LABS DRAWN AND BROUGHT BACK TO HAVE LABS DRAWN
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
## 2012-06-28 NOTE — ED Notes (Signed)
Pt discharged.Vital signs stable and GCS 15 

## 2012-07-09 ENCOUNTER — Emergency Department (HOSPITAL_COMMUNITY)
Admission: EM | Admit: 2012-07-09 | Discharge: 2012-07-09 | Disposition: A | Discharge status: Home / Self Care | Payer: No Typology Code available for payment source | Attending: Emergency Medicine | Admitting: Emergency Medicine

## 2012-07-09 ENCOUNTER — Encounter (HOSPITAL_COMMUNITY): Payer: Self-pay | Admitting: *Deleted

## 2012-07-09 DIAGNOSIS — Z8679 Personal history of other diseases of the circulatory system: Secondary | ICD-10-CM | POA: Insufficient documentation

## 2012-07-09 DIAGNOSIS — M549 Dorsalgia, unspecified: Secondary | ICD-10-CM

## 2012-07-09 DIAGNOSIS — Z79899 Other long term (current) drug therapy: Secondary | ICD-10-CM | POA: Insufficient documentation

## 2012-07-09 DIAGNOSIS — I10 Essential (primary) hypertension: Secondary | ICD-10-CM | POA: Insufficient documentation

## 2012-07-09 DIAGNOSIS — M25571 Pain in right ankle and joints of right foot: Secondary | ICD-10-CM

## 2012-07-09 DIAGNOSIS — Z8669 Personal history of other diseases of the nervous system and sense organs: Secondary | ICD-10-CM | POA: Insufficient documentation

## 2012-07-09 DIAGNOSIS — Z862 Personal history of diseases of the blood and blood-forming organs and certain disorders involving the immune mechanism: Secondary | ICD-10-CM | POA: Insufficient documentation

## 2012-07-09 DIAGNOSIS — G8921 Chronic pain due to trauma: Secondary | ICD-10-CM | POA: Insufficient documentation

## 2012-07-09 MED ORDER — HYDROCODONE-ACETAMINOPHEN 5-325 MG PO TABS: 1.0000 | ORAL_TABLET | ORAL | Status: DC | PRN

## 2012-07-09 MED ORDER — NAPROXEN 500 MG PO TABS: 500.0000 mg | ORAL_TABLET | Freq: Two times a day (BID) | ORAL | Status: DC
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
## 2012-07-09 NOTE — ED Notes (Signed)
Pt discharged to home with family. NAD.  
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
## 2012-07-18 ENCOUNTER — Other Ambulatory Visit: Payer: Self-pay | Admitting: Diagnostic Neuroimaging

## 2012-07-19 ENCOUNTER — Telehealth: Payer: Self-pay | Admitting: Diagnostic Neuroimaging

## 2012-07-19 MED ORDER — LEVETIRACETAM 500 MG PO TABS: 500.0000 mg | ORAL_TABLET | Freq: Two times a day (BID) | ORAL | Status: DC

## 2012-07-19 NOTE — Telephone Encounter (Signed)
Refill sent. Setup follow up appt for august/sept with Larita Fife or me.   Please let patient know plan. -VRP

## 2012-07-19 NOTE — Telephone Encounter (Signed)
Called and talked to patient and she states she missed her because she just started a new job. And she will have insurance in August. Patient stated she missed her apt because she did not want to miss work. Patient has four tablets left of Levetiracetam 500 mg bid. Patient states will Dr.Penumalli give me a refill or can I wait on insurance. Please advise.

## 2012-07-20 NOTE — Telephone Encounter (Signed)
Spoke to patient. Sched OV w/ Larita Fife. Pt was very grateful.

## 2012-09-19 ENCOUNTER — Encounter: Payer: Self-pay | Admitting: Nurse Practitioner

## 2012-09-19 ENCOUNTER — Ambulatory Visit (INDEPENDENT_AMBULATORY_CARE_PROVIDER_SITE_OTHER): Payer: BC Managed Care – PPO | Admitting: Nurse Practitioner

## 2012-09-19 VITALS — BP 143/91 | HR 66 | Ht 67.0 in | Wt 147.0 lb

## 2012-09-19 DIAGNOSIS — G40109 Localization-related (focal) (partial) symptomatic epilepsy and epileptic syndromes with simple partial seizures, not intractable, without status epilepticus: Secondary | ICD-10-CM | POA: Insufficient documentation

## 2012-09-19 DIAGNOSIS — G40309 Generalized idiopathic epilepsy and epileptic syndromes, not intractable, without status epilepticus: Secondary | ICD-10-CM

## 2012-09-19 MED ORDER — LEVETIRACETAM ER 500 MG PO TB24: 500.0000 mg | ORAL_TABLET | Freq: Every day | ORAL | Status: DC
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
## 2012-09-19 NOTE — Patient Instructions (Signed)
Keppra ER 500mg  Once daily.  Follow up in 6 months.

## 2012-09-21 NOTE — Progress Notes (Signed)
I reviewed note and agree with plan.   Jemmie Rhinehart R. Zeba Luby, MD 09/21/2012, 11:26 AM Certified in Neurology, Neurophysiology and Neuroimaging  [facility] [address], [address] , [place] [address] [phone]  

## 2012-11-02 ENCOUNTER — Other Ambulatory Visit: Payer: Self-pay

## 2012-11-02 MED ORDER — LEVETIRACETAM ER 500 MG PO TB24: 500.0000 mg | ORAL_TABLET | Freq: Every day | ORAL | Status: DC

## 2012-11-24 ENCOUNTER — Telehealth: Payer: Self-pay | Admitting: Diagnostic Neuroimaging

## 2012-11-24 NOTE — Telephone Encounter (Signed)
I called pt and she had a seizure last night, she has nocturnal seizures.  She has headache, tongue swollen, throat hurts, confused, and she is tired.  Son was there when she woke up, but no one observed seizure.  She has been taking her medication (keppra 500mg  po daily), is sleeping ok, not ill, is stress though.  I only see this message, she stated she called this am.  I told her that we would not be able to see her today.  I would touch base with L Lam, NP and go from there.

## 2012-11-24 NOTE — Telephone Encounter (Signed)
I called pt and relayed that she is recovering from her seizure now, Dr. Vickey Huger consulted.  See MD on Monday.   Made appt on Monday 11-27-12 at 0900.  Pt verbalized understanding.

## 2012-11-27 ENCOUNTER — Ambulatory Visit (INDEPENDENT_AMBULATORY_CARE_PROVIDER_SITE_OTHER): Payer: Medicaid Other | Admitting: Diagnostic Neuroimaging

## 2012-11-27 ENCOUNTER — Encounter (INDEPENDENT_AMBULATORY_CARE_PROVIDER_SITE_OTHER): Payer: Self-pay

## 2012-11-27 ENCOUNTER — Encounter: Payer: Self-pay | Admitting: Diagnostic Neuroimaging

## 2012-11-27 VITALS — BP 121/90 | HR 84 | Temp 98.6°F | Ht 67.0 in | Wt 140.0 lb

## 2012-11-27 DIAGNOSIS — G40309 Generalized idiopathic epilepsy and epileptic syndromes, not intractable, without status epilepticus: Secondary | ICD-10-CM

## 2012-11-27 MED ORDER — LEVETIRACETAM ER 500 MG PO TB24: 1000.0000 mg | ORAL_TABLET | Freq: Every day | ORAL | Status: DC
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
## 2012-11-27 NOTE — Patient Instructions (Signed)
Increase levetiracetam to 1000mg  at bedtime.

## 2012-12-09 HISTORY — PX: BREAST CYST ASPIRATION: SHX578

## 2012-12-12 ENCOUNTER — Other Ambulatory Visit: Payer: Self-pay | Admitting: Obstetrics & Gynecology

## 2012-12-12 DIAGNOSIS — N6311 Unspecified lump in the right breast, upper outer quadrant: Secondary | ICD-10-CM

## 2012-12-13 ENCOUNTER — Other Ambulatory Visit: Payer: Self-pay | Admitting: Obstetrics & Gynecology

## 2012-12-13 ENCOUNTER — Ambulatory Visit
Admission: RE | Admit: 2012-12-13 | Discharge: 2012-12-13 | Disposition: A | Discharge status: Home / Self Care | Payer: Medicaid Other | Source: Ambulatory Visit | Attending: Obstetrics & Gynecology | Admitting: Obstetrics & Gynecology

## 2012-12-13 DIAGNOSIS — N6311 Unspecified lump in the right breast, upper outer quadrant: Secondary | ICD-10-CM

## 2012-12-14 ENCOUNTER — Ambulatory Visit
Admission: RE | Admit: 2012-12-14 | Discharge: 2012-12-14 | Disposition: A | Discharge status: Home / Self Care | Payer: Medicaid Other | Source: Ambulatory Visit | Attending: Obstetrics & Gynecology | Admitting: Obstetrics & Gynecology

## 2012-12-14 DIAGNOSIS — N6311 Unspecified lump in the right breast, upper outer quadrant: Secondary | ICD-10-CM

## 2012-12-27 ENCOUNTER — Other Ambulatory Visit: Payer: No Typology Code available for payment source

## 2013-01-09 ENCOUNTER — Ambulatory Visit (INDEPENDENT_AMBULATORY_CARE_PROVIDER_SITE_OTHER): Payer: Self-pay | Admitting: Surgery

## 2013-01-10 ENCOUNTER — Telehealth (INDEPENDENT_AMBULATORY_CARE_PROVIDER_SITE_OTHER): Payer: Self-pay

## 2013-01-10 NOTE — Telephone Encounter (Signed)
LMOM for pt and for Sue Lush at Dr [facility] office that pt failed to keep appt 01-09-13.

## 2013-02-12 ENCOUNTER — Encounter (INDEPENDENT_AMBULATORY_CARE_PROVIDER_SITE_OTHER): Payer: Self-pay | Admitting: Surgery

## 2013-02-12 ENCOUNTER — Ambulatory Visit (INDEPENDENT_AMBULATORY_CARE_PROVIDER_SITE_OTHER): Payer: Medicaid Other | Admitting: Surgery

## 2013-02-12 ENCOUNTER — Encounter (INDEPENDENT_AMBULATORY_CARE_PROVIDER_SITE_OTHER): Payer: Self-pay

## 2013-02-12 VITALS — BP 134/82 | HR 68 | Temp 97.2°F | Resp 16 | Ht 66.0 in | Wt 136.4 lb

## 2013-02-12 DIAGNOSIS — N6011 Diffuse cystic mastopathy of right breast: Secondary | ICD-10-CM

## 2013-02-12 DIAGNOSIS — N6019 Diffuse cystic mastopathy of unspecified breast: Secondary | ICD-10-CM

## 2013-02-12 NOTE — Progress Notes (Signed)
General Surgery [facility]  Chief Complaint  Patient presents with  . Breast Mass    Evaluate breast mass - referral from Dr. Manon Hilding, [facility]    HISTORY: Patient is a 48 year old female referred for evaluation of recurring right breast cyst. Patient has a history of underlying fibrocystic change of the breast. She has had a cyst in the upper outer quadrant of right breast aspirated on 2 occasions. Her most recent aspiration was in early November 2014. No other breast biopsies have been performed.  Patient does note nipple discharge. This is a chronic problem. There is no family history of breast carcinoma.  Most recent breast ultrasound and mammogram show a 1.7 cm cyst in the upper outer quadrant of the right breast with a small approximately 1 cm fibroadenoma adjacent.  Past Medical History  Diagnosis Date  . Seizures   . Abnormal Pap smear     repeat pap  . Heart murmur   . Hypertension   . Anemia     Current Outpatient Prescriptions  Medication Sig Dispense Refill  . AMPICILLIN PO Take by mouth.      . levETIRAcetam (KEPPRA XR) 500 MG 24 hr tablet Take 2 tablets (1,000 mg total) by mouth at bedtime.  60 tablet  12  . methocarbamol (ROBAXIN) 500 MG tablet Take 1 tablet by mouth 2 (two) times daily.       No current facility-administered medications for this visit.    Allergies  Allergen Reactions  . Percocet [Oxycodone-Acetaminophen] Itching    Family History  Problem Relation Age of Onset  . Diabetes Maternal Grandmother   . Hypertension Maternal Grandmother   . Hypertension Mother     History   Social History  . Marital Status: Divorced    Spouse Name: N/A    Number of Children: 1  . Years of Education: MA   Occupational History  . [employer] History Main Topics  . Smoking status: Never Smoker   . Smokeless tobacco: Never Used  . Alcohol Use: 1.2 oz/week    2 Glasses of wine  per week  . Drug Use: No  . Sexual Activity: Not Currently    Birth Control/ Protection: None   Other Topics Concern  . None   Social History Narrative   Patient lives at home with son.   Caffeine Use: 1 cup of tea daily    REVIEW OF SYSTEMS - PERTINENT POSITIVES ONLY: Chronic intermittent nipple discharge. History of right breast cyst, not currently palpable  EXAM: Filed Vitals:   02/12/13 1437  BP: 134/82  Pulse: 68  Temp: 97.2 F (36.2 C)  Resp: 16    GENERAL: well-developed, well-nourished, no acute distress HEENT: normocephalic; pupils equal and reactive; sclerae clear; dentition good; mucous membranes moist NECK:  symmetric on extension; no palpable anterior or posterior cervical lymphadenopathy; no supraclavicular masses; no tenderness CHEST: clear to auscultation bilaterally without rales, rhonchi, or wheezes CARDIAC: regular rate and rhythm without significant murmur; peripheral pulses are full BREAST: Right nipple areolar complex is normal; breast parenchyma is diffusely nodular with increased density in the upper outer quadrant; there is mild tenderness to palpation in the upper outer quadrant of the right; there are no dominant nor discrete masses palpable; there is no right axillary adenopathy; left breast shows normal nipple areolar complex; breast parenchyma is diffusely nodular with increased density in the upper-outer quadrant, less so than is present on the right;  no dominant or discrete masses are palpable in the left breast; left axilla is free of adenopathy EXT:  non-tender without edema; no deformity NEURO: no gross focal deficits; no sign of tremor   LABORATORY RESULTS: See [facility] (CHL-Epic) for most recent results  RADIOLOGY RESULTS: See [facility] (CHL-Epic) for most recent results  IMPRESSION: #1 probable underlying fibrocystic change of the breast #2 history of recurrent right breast cyst, recently aspirated  PLAN: The patient  and I reviewed the above findings at length. We discussed her physical exam. We discussed the patient going self-examination.  At this point the dominant cyst in the upper outer quadrant of the right breast is not clearly palpable. It was aspirated less than 2 months ago. If it recurs, this will be the third recurrence and I would favor surgical excision. Given the fact that the patient has entered menopause, it may not recur.  Patient is asked to perform monthly self examinations and if she develops recurrence of the cyst to contact our office for surgical assessment. Otherwise, I plan to see her back for physical examination in 6 months.  Earnstine Regal, MD, [facility]  Primary Care Physician: No PCP Per Patient

## 2013-02-12 NOTE — Patient Instructions (Signed)

## 2013-02-27 ENCOUNTER — Ambulatory Visit: Payer: BC Managed Care – PPO | Admitting: Nurse Practitioner

## 2013-02-27 ENCOUNTER — Telehealth: Payer: Self-pay | Admitting: Nurse Practitioner

## 2013-02-27 NOTE — Telephone Encounter (Signed)
Patient called to reschedule less than 12 hour notice.

## 2013-03-02 ENCOUNTER — Encounter: Payer: Self-pay | Admitting: Nurse Practitioner

## 2013-03-02 ENCOUNTER — Ambulatory Visit (INDEPENDENT_AMBULATORY_CARE_PROVIDER_SITE_OTHER): Payer: Medicaid Other | Admitting: Nurse Practitioner

## 2013-03-02 VITALS — BP 130/83 | HR 74 | Ht 66.0 in | Wt 135.0 lb

## 2013-03-02 DIAGNOSIS — G40309 Generalized idiopathic epilepsy and epileptic syndromes, not intractable, without status epilepticus: Secondary | ICD-10-CM

## 2013-03-02 DIAGNOSIS — G4700 Insomnia, unspecified: Secondary | ICD-10-CM

## 2013-03-02 MED ORDER — TRAZODONE HCL 50 MG PO TABS: 50.0000 mg | ORAL_TABLET | Freq: Every day | ORAL | Status: DC

## 2013-03-02 NOTE — Patient Instructions (Signed)
Start Trazadone 50 mg -- Take 1/2 tablet at night for 2 weeks, after that time you may increase to the whole tablet if you are not having too much daytime drowsiness.  Continue Keppra XR at same dose.  Follow up in 6 months, sooner as needed.
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
## 2013-03-22 ENCOUNTER — Ambulatory Visit: Payer: BC Managed Care – PPO | Admitting: Nurse Practitioner

## 2013-05-07 ENCOUNTER — Ambulatory Visit: Payer: Medicaid Other | Admitting: Obstetrics & Gynecology

## 2013-05-07 ENCOUNTER — Ambulatory Visit (INDEPENDENT_AMBULATORY_CARE_PROVIDER_SITE_OTHER): Payer: Medicaid Other | Admitting: Obstetrics & Gynecology

## 2013-06-03 ENCOUNTER — Encounter (HOSPITAL_COMMUNITY): Payer: Self-pay | Admitting: Emergency Medicine

## 2013-06-03 ENCOUNTER — Emergency Department (HOSPITAL_COMMUNITY): Payer: Medicaid Other

## 2013-06-03 ENCOUNTER — Emergency Department (HOSPITAL_COMMUNITY)
Admission: EM | Admit: 2013-06-03 | Discharge: 2013-06-03 | Disposition: A | Discharge status: Home / Self Care | Payer: Medicaid Other | Attending: Emergency Medicine | Admitting: Emergency Medicine

## 2013-06-03 DIAGNOSIS — R079 Chest pain, unspecified: Secondary | ICD-10-CM

## 2013-06-03 DIAGNOSIS — Z862 Personal history of diseases of the blood and blood-forming organs and certain disorders involving the immune mechanism: Secondary | ICD-10-CM | POA: Insufficient documentation

## 2013-06-03 DIAGNOSIS — Z79899 Other long term (current) drug therapy: Secondary | ICD-10-CM | POA: Insufficient documentation

## 2013-06-03 DIAGNOSIS — G40909 Epilepsy, unspecified, not intractable, without status epilepticus: Secondary | ICD-10-CM | POA: Insufficient documentation

## 2013-06-03 DIAGNOSIS — R011 Cardiac murmur, unspecified: Secondary | ICD-10-CM | POA: Insufficient documentation

## 2013-06-03 DIAGNOSIS — R0789 Other chest pain: Secondary | ICD-10-CM | POA: Insufficient documentation

## 2013-06-03 DIAGNOSIS — I10 Essential (primary) hypertension: Secondary | ICD-10-CM | POA: Insufficient documentation

## 2013-06-03 LAB — BASIC METABOLIC PANEL
BUN: 7 mg/dL (ref 6–23)
CHLORIDE: 103 meq/L (ref 96–112)
CO2: 26 mEq/L (ref 19–32)
Calcium: 9.1 mg/dL (ref 8.4–10.5)
Creatinine, Ser: 0.84 mg/dL (ref 0.50–1.10)
GFR calc non Af Amer: 80 mL/min — ABNORMAL LOW (ref >90)
Glucose, Bld: 86 mg/dL (ref 70–99)
POTASSIUM: 4.1 meq/L (ref 3.7–5.3)
Sodium: 140 mEq/L (ref 137–147)

## 2013-06-03 LAB — CBC
HEMATOCRIT: 45.1 % (ref 36.0–46.0)
Hemoglobin: 15.6 g/dL — ABNORMAL HIGH (ref 12.0–15.0)
MCH: 30.2 pg (ref 26.0–34.0)
MCHC: 34.6 g/dL (ref 30.0–36.0)
MCV: 87.2 fL (ref 78.0–100.0)
Platelets: 314 10*3/uL (ref 150–400)
RBC: 5.17 MIL/uL — AB (ref 3.87–5.11)
RDW: 15.8 % — AB (ref 11.5–15.5)
WBC: 3.2 10*3/uL — AB (ref 4.0–10.5)

## 2013-06-03 LAB — I-STAT TROPONIN, ED: TROPONIN I, POC: 0 ng/mL (ref 0.00–0.08)

## 2013-06-03 MED ORDER — KETOROLAC TROMETHAMINE 30 MG/ML IJ SOLN
30.0000 mg | Freq: Once | INTRAMUSCULAR | Status: AC
  Administered 2013-06-03: 30 mg via INTRAVENOUS
  Filled 2013-06-03: qty 1

## 2013-06-03 NOTE — ED Notes (Signed)
Pt c/o pressure in her chest radiating into her back that woke her from sleep this am. States "its hard to take a deep breath." she reports recent increased stress. She is A&OX4, breathing easily

## 2013-06-03 NOTE — ED Provider Notes (Signed)
CSN: [id number]     Arrival date & time 06/03/13  1008 History   First MD Initiated Contact with Patient 06/03/13 1027     Chief Complaint  Patient presents with  . Chest Pain     (Consider location/radiation/quality/duration/timing/severity/associated sxs/prior Treatment) HPI Comments: Patient is a 48 year old female with no prior cardiac history and no cardiac risk factors. She presents today with complaints of tightness in her chest that woke her from sleep at approximately 6 AM. She denies shortness of breath but states the pain is worse when she takes a deep breath. She denies any nausea, diaphoresis, or radiation to the arm or jaw. She is reactive and denies any recent exertional symptoms.  Patient is a 48 y.o. female presenting with chest pain. The history is provided by the patient.  Chest Pain Pain location:  Substernal area Pain quality: tightness   Pain radiates to the back: yes   Pain severity:  Moderate Onset quality:  Sudden Duration:  5 hours Timing:  Constant Progression:  Unchanged Chronicity:  New Context: breathing   Relieved by:  Nothing Worsened by:  Nothing tried Ineffective treatments:  None tried Associated symptoms: no abdominal pain, no fatigue, no fever and no shortness of breath     Past Medical History  Diagnosis Date  . Seizures   . Abnormal Pap smear     repeat pap  . Heart murmur   . Hypertension   . Anemia    Past Surgical History  Procedure Laterality Date  . Appendectomy     Family History  Problem Relation Age of Onset  . Diabetes Maternal Grandmother   . Hypertension Maternal Grandmother   . Hypertension Mother    History  Substance Use Topics  . Smoking status: Never Smoker   . Smokeless tobacco: Never Used  . Alcohol Use: 1.2 oz/week    2 Glasses of wine per week   OB History   Grav Para Term Preterm Abortions TAB SAB Ect Mult Living   4 1 1  3 3    1      Review of Systems  Constitutional: Negative for fever and  fatigue.  Respiratory: Negative for shortness of breath.   Cardiovascular: Positive for chest pain.  Gastrointestinal: Negative for abdominal pain.  All other systems reviewed and are negative.     Allergies  Percocet  Home Medications   Prior to Admission medications   Medication Sig Start Date End Date Taking? Authorizing Provider  ALPRAZolam Duanne Moron) 0.5 MG tablet Take 0.5 mg by mouth at bedtime as needed for anxiety.    Historical Provider, MD  AMPICILLIN PO Take by mouth.    Historical Provider, MD  levETIRAcetam (KEPPRA XR) 500 MG 24 hr tablet Take 2 tablets (1,000 mg total) by mouth at bedtime. 11/27/12   Penni Bombard, MD  methocarbamol (ROBAXIN) 500 MG tablet Take 1 tablet by mouth 2 (two) times daily. 1/2 to 1 by mouth twice a day. 11/22/12   Historical Provider, MD  traZODone (DESYREL) 50 MG tablet Take 1 tablet (50 mg total) by mouth at bedtime. 03/02/13   Philmore Pali, NP   BP 150/91  Pulse 62  Temp(Src) 99 F (37.2 C) (Oral)  Resp 16  Ht 5\' 7"  (1.702 m)  Wt 140 lb (63.504 kg)  BMI 21.92 kg/m2  SpO2 99% Physical Exam  Nursing note and vitals reviewed. Constitutional: She is oriented to person, place, and time. She appears well-developed and well-nourished. No distress.  HENT:  Head: Normocephalic and atraumatic.  Neck: Normal range of motion. Neck supple.  Cardiovascular: Normal rate and regular rhythm.  Exam reveals no gallop and no friction rub.   No murmur heard. Pulmonary/Chest: Effort normal and breath sounds normal. No respiratory distress. She has no wheezes.  Abdominal: Soft. Bowel sounds are normal. She exhibits no distension. There is no tenderness.  Musculoskeletal: Normal range of motion.  Neurological: She is alert and oriented to person, place, and time.  Skin: Skin is warm and dry. She is not diaphoretic.    ED Course  Procedures (including critical care time) Labs Review Labs Reviewed  [place], ED     Imaging Review No results found.   EKG Interpretation   Date/Time:  Sunday June 03 2013 10:14:42 EDT Ventricular Rate:  71 PR Interval:  190 QRS Duration: 84 QT Interval:  414 QTC Calculation: 449 R Axis:   77 Text Interpretation:  Normal sinus rhythm Possible Left atrial enlargement  Low voltage QRS Septal infarct , age undetermined Abnormal ECG No change  from 06/28/12 Confirmed by DELOS  MD, Naria Abbey ([id number]) on 06/03/2013 10:38:38  AM      MDM   Final diagnoses:  None    Patient is a 48 year old female with no prior cardiac history. She presents today with sharp pains in the center of her chest that woke her from sleep. She expresses to me she has been under a excessive amounts of stress and believes that this may be anxiety related. I have found nothing and workup to indicate anything cardiac. Her EKG is normal, troponin is negative, and EKG is unremarkable. She was given IV Toradol and is feeling much better. At this point I feels that she is appropriate for discharge. Almyra Free advised to take ibuprofen and return when necessary if her symptoms substantially worsen or change.    Veryl Speak, MD 06/03/13 [phone]

## 2013-06-03 NOTE — ED Notes (Signed)
Pt is in a gown and on a monitor

## 2013-06-03 NOTE — Discharge Instructions (Signed)
Ibuprofen 600 mg every 6 hours as needed for pain.  Return to the emergency department if you develop worsening pain, difficulty breathing, or your symptoms change in nature.   Chest Pain (Nonspecific) It is often hard to give a specific diagnosis for the cause of chest pain. There is always a chance that your pain could be related to something serious, such as a heart attack or a blood clot in the lungs. You need to follow up with your caregiver for further evaluation. CAUSES   Heartburn.  Pneumonia or bronchitis.  Anxiety or stress.  Inflammation around your heart (pericarditis) or lung (pleuritis or pleurisy).  A blood clot in the lung.  A collapsed lung (pneumothorax). It can develop suddenly on its own (spontaneous pneumothorax) or from injury (trauma) to the chest.  Shingles infection (herpes zoster virus). The chest wall is composed of bones, muscles, and cartilage. Any of these can be the source of the pain.  The bones can be bruised by injury.  The muscles or cartilage can be strained by coughing or overwork.  The cartilage can be affected by inflammation and become sore (costochondritis). DIAGNOSIS  Lab tests or other studies, such as X-rays, electrocardiography, stress testing, or cardiac imaging, may be needed to find the cause of your pain.  TREATMENT   Treatment depends on what may be causing your chest pain. Treatment may include:  Acid blockers for heartburn.  Anti-inflammatory medicine.  Pain medicine for inflammatory conditions.  Antibiotics if an infection is present.  You may be advised to change lifestyle habits. This includes stopping smoking and avoiding alcohol, caffeine, and chocolate.  You may be advised to keep your head raised (elevated) when sleeping. This reduces the chance of acid going backward from your stomach into your esophagus.  Most of the time, nonspecific chest pain will improve within 2 to 3 days with rest and mild pain  medicine. HOME CARE INSTRUCTIONS   If antibiotics were prescribed, take your antibiotics as directed. Finish them even if you start to feel better.  For the next few days, avoid physical activities that bring on chest pain. Continue physical activities as directed.  Do not smoke.  Avoid drinking alcohol.  Only take over-the-counter or prescription medicine for pain, discomfort, or fever as directed by your caregiver.  Follow your caregiver's suggestions for further testing if your chest pain does not go away.  Keep any follow-up appointments you made. If you do not go to an appointment, you could develop lasting (chronic) problems with pain. If there is any problem keeping an appointment, you must call to reschedule. SEEK MEDICAL CARE IF:   You think you are having problems from the medicine you are taking. Read your medicine instructions carefully.  Your chest pain does not go away, even after treatment.  You develop a rash with blisters on your chest. SEEK IMMEDIATE MEDICAL CARE IF:   You have increased chest pain or pain that spreads to your arm, neck, jaw, back, or abdomen.  You develop shortness of breath, an increasing cough, or you are coughing up blood.  You have severe back or abdominal pain, feel nauseous, or vomit.  You develop severe weakness, fainting, or chills.  You have a fever. THIS IS AN EMERGENCY. Do not wait to see if the pain will go away. Get medical help at once. Call your local emergency services (911 in [place] Do not drive yourself to the hospital. MAKE SURE YOU:   Understand these instructions.  Will watch  your condition.  Will get help right away if you are not doing well or get worse. Document Released: 11/04/2004 Document Revised: 04/19/2011 Document Reviewed: 08/31/2007 [facility] Patient Information 2014 [place].

## 2013-06-11 ENCOUNTER — Encounter: Payer: Self-pay | Admitting: Obstetrics & Gynecology

## 2013-06-11 ENCOUNTER — Telehealth (INDEPENDENT_AMBULATORY_CARE_PROVIDER_SITE_OTHER): Payer: Self-pay

## 2013-06-11 ENCOUNTER — Ambulatory Visit (INDEPENDENT_AMBULATORY_CARE_PROVIDER_SITE_OTHER): Payer: Medicaid Other | Admitting: Obstetrics & Gynecology

## 2013-06-11 VITALS — BP 134/91 | HR 63 | Temp 97.9°F | Ht 67.0 in | Wt 137.0 lb

## 2013-06-11 DIAGNOSIS — N951 Menopausal and female climacteric states: Secondary | ICD-10-CM

## 2013-06-11 LAB — POCT URINE PREGNANCY: Preg Test, Ur: NEGATIVE

## 2013-06-11 MED ORDER — LO LOESTRIN FE 1 MG-10 MCG / 10 MCG PO TABS: 1.0000 | ORAL_TABLET | Freq: Every day | ORAL | Status: DC

## 2013-06-11 NOTE — Telephone Encounter (Signed)
Pt walked in office today to make appt with Dr Harlow Asa to follow up her painful breast cyst. I reviewed todays ov note with her GYN in epic with pt. Pt advised not mass or cyst noted today on exam. Pt states it is just sore and she walked to make appt. Pt advised once we have medicaid auth in system I can make appt for her to see Dr Harlow Asa when he returns to office in 3-4 weeks. Pt states she is ok with this. Pt asked front desk to call her once she has auth in system and set up appt. I have asked Doris to call pt with first avail ov once she has auth in system.

## 2013-06-11 NOTE — Progress Notes (Signed)
Subjective:    Claudia Elliott is a 48 y.o. female who presents to discuss hormone replacement therapy. Patient is requesting hormone replacement therapy due to hot flashes and moodiness. The patient is not taking hormone replacement therapy. The patient is not currently sexually active. GYN screening history: last pap: was normal. The patient currently has symptoms of hot flashes for the past year. Symptoms in the past had included: moodiness.   Current hormone therapy:  none  Previous hormone therapy:  none   Menstrual History: OB History   Grav Para Term Preterm Abortions TAB SAB Ect Mult Living   4 1 1  3 3    1      Patient's last menstrual period was 04/09/2013.     Past Medical History  Diagnosis Date  . Seizures   . Abnormal Pap smear     repeat pap  . Heart murmur   . Hypertension   . Anemia     Past Surgical History  Procedure Laterality Date  . Appendectomy      Current outpatient prescriptions:levETIRAcetam (KEPPRA XR) 500 MG 24 hr tablet, Take 500 mg by mouth at bedtime., Disp: , Rfl: ;  LORazepam (ATIVAN) 0.5 MG tablet, Take 0.5 mg by mouth daily., Disp: , Rfl: ;  traZODone (DESYREL) 50 MG tablet, Take 1 tablet (50 mg total) by mouth at bedtime., Disp: 30 tablet, Rfl: 5;  LO LOESTRIN FE 1 MG-10 MCG / 10 MCG tablet, Take 1 tablet by mouth daily., Disp: 1 Package, Rfl: 11 Allergies  Allergen Reactions  . Percocet [Oxycodone-Acetaminophen] Itching    History  Substance Use Topics  . Smoking status: Former Research scientist (life sciences)  . Smokeless tobacco: Never Used  . Alcohol Use: 1.2 oz/week    2 Glasses of wine per week    Family History  Problem Relation Age of Onset  . Diabetes Maternal Grandmother   . Hypertension Maternal Grandmother   . Hypertension Mother     Review of Systems Constitutional: positive for night sweats Genitourinary:positive for hot flashes, negative for sex problems, urinary incontinence Integument/breast: positive for breast lump, breast  tenderness; negative for nipple discharge and skin lesion(s) Behavioral/Psych: positive for depression Endocrine: positive for temperature intolerance    Objective:     BP 134/91  Pulse 63  Temp(Src) 97.9 F (36.6 C)  Ht 5\' 7"  (1.702 m)  Wt 62.143 kg (137 lb)  BMI 21.45 kg/m2  LMP 04/09/2013 General:   alert  Skin:   no rash or abnormalities  Lungs:   clear to auscultation bilaterally  Heart:   regular rate and rhythm, S1, S2 normal, no murmur, click, rub or gallop  Breasts:   left breast normal without suspicious masses, skin or nipple changes or axillary nodes  Abdomen:  normal findings: no organomegaly, soft, non-tender and no hernia         Physical Exam  Pulmonary/Chest:      .   Assessment:    Hormone replacement therapy in 48 y.o. woman.    Plan:    Patient requests HRT. Risks and benefits of HRT, including recent evidence on HRT effects on breast cancer and heart disease, were discussed. Will begin patient on Oral contraceptive pills: Lo Loestrin. Meds ordered this encounter  Medications  . LO LOESTRIN FE 1 MG-10 MCG / 10 MCG tablet    Sig: Take 1 tablet by mouth daily.    Dispense:  1 Package    Refill:  11   Orders Placed This Encounter  Procedures  .  POCT urine pregnancy    Encouraged follow up with General Surgery Return in 3 mths

## 2013-06-12 NOTE — Patient Instructions (Signed)
Perimenopause  Perimenopause is the time when your body begins to move into the menopause (no menstrual period for 12 straight months). It is a natural process. Perimenopause can begin 2 8 years before the menopause and usually lasts for 1 year after the menopause. During this time, your ovaries may or may not produce an egg. The ovaries vary in their production of estrogen and progesterone hormones each month. This can cause irregular menstrual periods, difficulty getting pregnant, vaginal bleeding between periods, and uncomfortable symptoms.  CAUSES  · Irregular production of the ovarian hormones, estrogen and progesterone, and not ovulating every month.  · Other causes include:  · Tumor of the pituitary gland in the brain.  · Medical disease that affects the ovaries.  · Radiation treatment.  · Chemotherapy.  · Unknown causes.  · Heavy smoking and excessive alcohol intake can bring on perimenopause sooner.  SIGNS AND SYMPTOMS   · Hot flashes.  · Night sweats.  · Irregular menstrual periods.  · Decreased sex drive.  · Vaginal dryness.  · Headaches.  · Mood swings.  · Depression.  · Memory problems.  · Irritability.  · Tiredness.  · Weight gain.  · Trouble getting pregnant.  · The beginning of losing bone cells (osteoporosis).  · The beginning of hardening of the arteries (atherosclerosis).  DIAGNOSIS   Your health care provider will make a diagnosis by analyzing your age, menstrual history, and symptoms. He or she will do a physical exam and note any changes in your body, especially your female organs. Female hormone tests may or may not be helpful depending on the amount of female hormones you produce and when you produce them. However, other hormone tests may be helpful to rule out other problems.  TREATMENT   In some cases, no treatment is needed. The decision on whether treatment is necessary during the perimenopause should be made by you and your health care provider based on how the symptoms are affecting you  and your lifestyle. Various treatments are available, such as:  · Treating individual symptoms with a specific medicine for that symptom.  · Herbal medicines that can help specific symptoms.  · Counseling.  · Group therapy.  HOME CARE INSTRUCTIONS   · Keep track of your menstrual periods (when they occur, how heavy they are, how long between periods, and how long they last) as well as your symptoms and when they started.  · Only take over-the-counter or prescription medicines as directed by your health care provider.  · Sleep and rest.  · Exercise.  · Eat a diet that contains calcium (good for your bones) and soy (acts like the estrogen hormone).  · Do not smoke.  · Avoid alcoholic beverages.  · Take vitamin supplements as recommended by your health care provider. Taking vitamin E may help in certain cases.  · Take calcium and vitamin D supplements to help prevent bone loss.  · Group therapy is sometimes helpful.  · Acupuncture may help in some cases.  SEEK MEDICAL CARE IF:   · You have questions about any symptoms you are having.  · You need a referral to a specialist (gynecologist, psychiatrist, or psychologist).  SEEK IMMEDIATE MEDICAL CARE IF:   · You have vaginal bleeding.  · Your period lasts longer than 8 days.  · Your periods are recurring sooner than 21 days.  · You have bleeding after intercourse.  · You have severe depression.  · You have pain when you urinate.  · 

## 2013-07-06 ENCOUNTER — Telehealth: Payer: Self-pay | Admitting: *Deleted

## 2013-07-06 NOTE — Telephone Encounter (Signed)
Patient states she is perimenopausal and has been skipping cycles for some time. She was given OCP at her last visit. She has been taking them daily and reports she has started heavy bleeding with severe cramoing. Patient is feeling bad and wants to know what she should do. She is not on a withdrawal week. Spoke to patient - will message Dr Delsa Sale to see what her recommendation is.

## 2013-07-06 NOTE — Telephone Encounter (Signed)
Spoke to Claudia Elliott regarding patient before leaving- had not heard back from doctor- called patient and advised per Claudia Elliott- patient is in cycle/WD week of pills and could expect to have heavy cycle if she has been skipping menses. Ibuprofen 600mg /6 hr for 48 hr and heat to help with discomfort.

## 2013-07-16 ENCOUNTER — Encounter (INDEPENDENT_AMBULATORY_CARE_PROVIDER_SITE_OTHER): Payer: Self-pay | Admitting: Surgery

## 2013-07-16 ENCOUNTER — Ambulatory Visit (INDEPENDENT_AMBULATORY_CARE_PROVIDER_SITE_OTHER): Payer: Medicaid Other | Admitting: Surgery

## 2013-07-16 ENCOUNTER — Other Ambulatory Visit (INDEPENDENT_AMBULATORY_CARE_PROVIDER_SITE_OTHER): Payer: Self-pay | Admitting: Surgery

## 2013-07-16 ENCOUNTER — Telehealth (INDEPENDENT_AMBULATORY_CARE_PROVIDER_SITE_OTHER): Payer: Self-pay | Admitting: *Deleted

## 2013-07-16 VITALS — BP 130/85 | HR 65 | Temp 98.0°F | Resp 16 | Ht 66.0 in | Wt 140.2 lb

## 2013-07-16 DIAGNOSIS — N6019 Diffuse cystic mastopathy of unspecified breast: Secondary | ICD-10-CM

## 2013-07-16 DIAGNOSIS — N6011 Diffuse cystic mastopathy of right breast: Secondary | ICD-10-CM

## 2013-07-16 NOTE — Progress Notes (Signed)
General Surgery [facility]  Chief Complaint  Patient presents with  . Follow-up    fibrocystic change, right breast cyst    HISTORY: Patient is a 48 year old female followed for fibrocystic breast disease and breast pain. She was last evaluated in January 2015.  Patient presents today with persistent discomfort in the upper outer quadrant of the right breast. She has not had any further diagnostic studies performed in the interim.  PERTINENT REVIEW OF SYSTEMS: Persistent right upper quadrant breast pain. Denies pain on the left. Denies bloody nipple discharge. Denies any new press masses.  EXAM: HEENT: normocephalic; pupils equal and reactive; sclerae clear; dentition good; mucous membranes moist NECK:  No palpable mass in the thyroid bed; symmetric on extension; solitary palpable posterior cervical lymph node left neck, 1 cm, soft, mobile, nontender; no supraclavicular masses; no tenderness CHEST: clear to auscultation bilaterally without rales, rhonchi, or wheezes CARDIAC: regular rate and rhythm without significant murmur; peripheral pulses are full BREAST: Normal nipple areolar complex on the right; breast parenchyma is diffusely nodular without discrete or dominant mass; increased breast density in the upper outer quadrant with moderate tenderness; no axillary adenopathy on the right. Left breast with normal nipple areolar complex; diffusely nodular breast parenchyma without discrete or dominant mass; slight increased density in the upper-outer quadrant but not to do degrees seen on the right; left axilla is free of adenopathy. EXT:  non-tender without edema; no deformity NEURO: no gross focal deficits; no sign of tremor   IMPRESSION: #1 fibrocystic change of the breast, greater on the right than on the left #2 history of right breast cyst  PLAN: The patient and I discussed the above findings. I would like to obtain an ultrasound of the right breast to  evaluate for recurrence of her cyst. If there is a significant cystic mass, we may consider surgical excision for definitive diagnosis, prevention of recurrence, and symptomatic relief. I have however cautioned the patient that simply removing the cyst may not relieve her breast pain.  We discussed measures to take in order to decrease symptoms of fibrocystic change. She understands.  We will contact her with the results of her ultrasound and then make a decision regarding surgical intervention.  Earnstine Regal, MD, [facility] Office: [phone]  Visit Diagnoses: 1. Cyst of breast, right, diffuse fibrocystic

## 2013-07-16 NOTE — Patient Instructions (Signed)
Fibrocystic Breast Changes Fibrocystic breast changes occur when breast ducts become blocked, causing painful, fluid-filled lumps (cysts) to form in the breast. This is a common condition that is noncancerous (benign). It occurs when women go through hormonal changes during their menstrual cycle. Fibrocystic breast changes can affect one or both breasts. CAUSES  The exact cause of fibrocystic breast changes is not known, but it may be related to the female hormones, estrogen and progesterone. Family traits that get passed from parent to child (genetics) may also be a factor in some cases. SIGNS AND SYMPTOMS   Tenderness, mild discomfort, or pain.   Swelling.   Ropelike feeling when touching the breast.   Lumpy breast, one or both sides.   Changes in breast size, especially before (larger) and after (smaller) the menstrual period.   Green or dark brown nipple discharge (not blood).  Symptoms are usually worse before menstrual periods start and get better toward the end of the menstrual period.  DIAGNOSIS  To make a diagnosis, your health care provider will ask you questions and perform a physical exam of your breasts. The health care provider may recommend other tests that can examine inside your breasts, such as:  A breast X-ray (mammogram).   Ultrasonography.  An MRI.  If something more than fibrocystic breast changes is suspected, your health care provider may take a breast tissue sample (breast biopsy) to examine. TREATMENT  Often, treatment is not needed. Your health care provider may recommend over-the-counter pain relievers to help lessen pain or discomfort caused by the fibrocystic breast changes. You may also be asked to change your diet to limit or stop eating foods or drinking beverages that contain caffeine. Foods and beverages that contain caffeine include chocolate, soda, coffee, and tea. Reducing sugar and fat in your diet may also help. Your health care provider  may also recommend:  Fine needle aspiration to remove fluid from a cyst that is causing pain.   Surgery to remove a large, persistent, and tender cyst. HOME CARE INSTRUCTIONS   Examine your breasts after every menstrual period. If you do not have menstrual periods, check your breasts the first day of every month. Feel for changes, such as more tenderness, a new growth, a change in breast size, or a change in a lump that has always been there.   Only take over-the-counter or prescription medicine as directed by your health care provider.   Wear a well-fitted support or sports bra, especially when exercising.   Decrease or avoid caffeine, fat, and sugar in your diet as directed by your health care provider.  SEEK MEDICAL CARE IF:   You have fluid leaking (discharge) from your nipples, especially bloody discharge.   You have new lumps or bumps in the breast.   Your breast or breasts become enlarged, red, and painful.   You have areas of your breast that pucker in.   Your nipples appear flat or indented.  Document Released: 11/11/2005 Document Revised: 09/27/2012 Document Reviewed: 07/16/2012 [facility] Patient Information 2014 [place].

## 2013-07-16 NOTE — Telephone Encounter (Signed)
LM for pt regarding her [place] appt.  Please advise pt, anyone over 50 years of age, also requires a new mammogram anytime there is a problem with the breast.  They both are scheduled 07-24-13 arrive at 8:30 at [facility].  Advise pt please do not use deodorant, perfume, powders, or lotions the morning of her test.  Thanks!  Anderson Malta

## 2013-07-24 ENCOUNTER — Other Ambulatory Visit: Payer: BC Managed Care – PPO

## 2013-07-25 ENCOUNTER — Telehealth (INDEPENDENT_AMBULATORY_CARE_PROVIDER_SITE_OTHER): Payer: Self-pay | Admitting: *Deleted

## 2013-07-25 NOTE — Telephone Encounter (Signed)
PT had left a voicemail on my phone.  I returned pt's call and left her a message to return my call if she still needed assistance.  Anderson Malta

## 2013-07-27 ENCOUNTER — Ambulatory Visit
Admission: RE | Admit: 2013-07-27 | Discharge: 2013-07-27 | Disposition: A | Discharge status: Home / Self Care | Payer: Medicaid Other | Source: Ambulatory Visit | Attending: Surgery | Admitting: Surgery

## 2013-07-27 ENCOUNTER — Other Ambulatory Visit (INDEPENDENT_AMBULATORY_CARE_PROVIDER_SITE_OTHER): Payer: Self-pay | Admitting: Surgery

## 2013-07-27 DIAGNOSIS — N6011 Diffuse cystic mastopathy of right breast: Secondary | ICD-10-CM

## 2013-09-05 ENCOUNTER — Other Ambulatory Visit: Payer: Self-pay | Admitting: Neurology

## 2013-09-07 ENCOUNTER — Telehealth: Payer: Self-pay | Admitting: Internal Medicine

## 2013-09-07 NOTE — Telephone Encounter (Signed)
Called pton 07/31@ 1:25pm  and LVM about [facility] appt.  [place]

## 2013-09-10 ENCOUNTER — Ambulatory Visit: Payer: Medicaid Other | Admitting: Obstetrics & Gynecology

## 2013-09-12 ENCOUNTER — Ambulatory Visit: Payer: Medicaid Other | Admitting: Obstetrics & Gynecology

## 2013-09-20 ENCOUNTER — Telehealth: Payer: Self-pay | Admitting: Diagnostic Neuroimaging

## 2013-09-20 NOTE — Telephone Encounter (Signed)
Patient calling to state that she had a new seizure 2 nights ago and wants to be seen by Je[facility] Hawking, not by Dr. Leta Baptist, please return call and advise.

## 2013-10-10 ENCOUNTER — Other Ambulatory Visit: Payer: Self-pay | Admitting: Nurse Practitioner

## 2013-10-10 ENCOUNTER — Other Ambulatory Visit: Payer: Self-pay | Admitting: Neurology

## 2013-10-10 ENCOUNTER — Telehealth: Payer: Self-pay | Admitting: Nurse Practitioner

## 2013-10-10 NOTE — Telephone Encounter (Signed)
Rx has already been sent.  I spoke with the patient.  She is aware.

## 2013-10-10 NOTE — Telephone Encounter (Signed)
Patient stated refill have run out for levETIRAcetam (KEPPRA XR) 500 MG 24 hr tablet.  Please return call anytime and leave detailed message on voice mail if not available.

## 2013-10-28 ENCOUNTER — Encounter (HOSPITAL_COMMUNITY): Payer: Self-pay | Admitting: Emergency Medicine

## 2013-10-28 ENCOUNTER — Emergency Department (HOSPITAL_COMMUNITY)
Admission: EM | Admit: 2013-10-28 | Discharge: 2013-10-28 | Disposition: A | Discharge status: Home / Self Care | Payer: Medicaid Other | Attending: Emergency Medicine | Admitting: Emergency Medicine

## 2013-10-28 DIAGNOSIS — Z79899 Other long term (current) drug therapy: Secondary | ICD-10-CM | POA: Diagnosis not present

## 2013-10-28 DIAGNOSIS — I10 Essential (primary) hypertension: Secondary | ICD-10-CM | POA: Diagnosis not present

## 2013-10-28 DIAGNOSIS — R011 Cardiac murmur, unspecified: Secondary | ICD-10-CM | POA: Insufficient documentation

## 2013-10-28 DIAGNOSIS — Z87891 Personal history of nicotine dependence: Secondary | ICD-10-CM | POA: Diagnosis not present

## 2013-10-28 DIAGNOSIS — G40909 Epilepsy, unspecified, not intractable, without status epilepticus: Secondary | ICD-10-CM | POA: Insufficient documentation

## 2013-10-28 DIAGNOSIS — R569 Unspecified convulsions: Secondary | ICD-10-CM | POA: Diagnosis present

## 2013-10-28 DIAGNOSIS — Z862 Personal history of diseases of the blood and blood-forming organs and certain disorders involving the immune mechanism: Secondary | ICD-10-CM | POA: Insufficient documentation

## 2013-10-28 LAB — COMPREHENSIVE METABOLIC PANEL
ALT: 11 U/L (ref 0–35)
AST: 33 U/L (ref 0–37)
Albumin: 3.7 g/dL (ref 3.5–5.2)
Alkaline Phosphatase: 23 U/L — ABNORMAL LOW (ref 39–117)
Anion gap: 14 (ref 5–15)
BILIRUBIN TOTAL: 0.3 mg/dL (ref 0.3–1.2)
BUN: 9 mg/dL (ref 6–23)
CHLORIDE: 101 meq/L (ref 96–112)
CO2: 21 meq/L (ref 19–32)
Calcium: 8.9 mg/dL (ref 8.4–10.5)
Creatinine, Ser: 0.96 mg/dL (ref 0.50–1.10)
GFR calc Af Amer: 79 mL/min — ABNORMAL LOW (ref >90)
GFR calc non Af Amer: 68 mL/min — ABNORMAL LOW (ref >90)
Glucose, Bld: 108 mg/dL — ABNORMAL HIGH (ref 70–99)
Potassium: 4.3 mEq/L (ref 3.7–5.3)
SODIUM: 136 meq/L — AB (ref 137–147)
Total Protein: 7.4 g/dL (ref 6.0–8.3)

## 2013-10-28 LAB — CBC WITH DIFFERENTIAL/PLATELET
BASOS ABS: 0 10*3/uL (ref 0.0–0.1)
Basophils Relative: 1 % (ref 0–1)
Eosinophils Absolute: 0.1 10*3/uL (ref 0.0–0.7)
Eosinophils Relative: 2 % (ref 0–5)
HEMATOCRIT: 38.7 % (ref 36.0–46.0)
Hemoglobin: 13.3 g/dL (ref 12.0–15.0)
LYMPHS PCT: 20 % (ref 12–46)
Lymphs Abs: 1 10*3/uL (ref 0.7–4.0)
MCH: 31.1 pg (ref 26.0–34.0)
MCHC: 34.4 g/dL (ref 30.0–36.0)
MCV: 90.4 fL (ref 78.0–100.0)
Monocytes Absolute: 0.5 10*3/uL (ref 0.1–1.0)
Monocytes Relative: 11 % (ref 3–12)
Neutro Abs: 3.3 10*3/uL (ref 1.7–7.7)
Neutrophils Relative %: 66 % (ref 43–77)
PLATELETS: 400 10*3/uL (ref 150–400)
RBC: 4.28 MIL/uL (ref 3.87–5.11)
RDW: 12.9 % (ref 11.5–15.5)
WBC: 4.9 10*3/uL (ref 4.0–10.5)

## 2013-10-28 NOTE — Discharge Instructions (Signed)

## 2013-10-28 NOTE — ED Notes (Signed)
Bed: BT66 Expected date: 10/28/13 Expected time: 4:43 PM Means of arrival: Ambulance Comments: seizure

## 2013-10-28 NOTE — ED Provider Notes (Signed)
CSN: [id number]     Arrival date & time 10/28/13  1652 History   First MD Initiated Contact with Patient 10/28/13 1659     Chief Complaint  Patient presents with  . Seizures     (Consider location/radiation/quality/duration/timing/severity/associated sxs/prior Treatment) Patient is a 48 y.o. female presenting with seizures.  Seizures  Claudia Elliott is a 48 y.o. female with a history of recurrent seizures on Keppra therapy who comes in today for evaluation of seizure. Patient reports she was watching a football game around 4:00 and would take a nap on the couch and then woke up and was told she had a seizure. Seizure lasted about 4 min. She was concerned and came to the emergency department because typically her seizures occur at night and she has never had one during the day. She has been on Keppra therapy for many years without complication. She reports her seizures are generally tonic-clonic without aura. She does report biting her tongue but no loss of bowel or bladder function. She did not hit her head. She typically has confusion following her seizure for about 10 minutes and then she returns to baseline. She reports no confusion at this time. She denies any fevers, recent infections, changes in medications.    Past Medical History  Diagnosis Date  . Seizures   . Abnormal Pap smear     repeat pap  . Heart murmur   . Hypertension   . Anemia    Past Surgical History  Procedure Laterality Date  . Appendectomy     Family History  Problem Relation Age of Onset  . Diabetes Maternal Grandmother   . Hypertension Maternal Grandmother   . Hypertension Mother    History  Substance Use Topics  . Smoking status: Former Research scientist (life sciences)  . Smokeless tobacco: Never Used  . Alcohol Use: 1.2 oz/week    2 Glasses of wine per week   OB History   Grav Para Term Preterm Abortions TAB SAB Ect Mult Living   4 1 1  3 3    1      Review of Systems  Constitutional: Negative for fever.   Respiratory: Negative for shortness of breath.   Cardiovascular: Negative for chest pain.  Skin: Negative for rash.  Neurological: Positive for seizures.      Allergies  Percocet  Home Medications   Prior to Admission medications   Medication Sig Start Date End Date Taking? Authorizing Provider  levETIRAcetam (KEPPRA XR) 500 MG 24 hr tablet Take 2 tablets (1,000 mg total) by mouth at bedtime. 10/10/13  Yes Philmore Pali, NP  LO LOESTRIN FE 1 MG-10 MCG / 10 MCG tablet Take 1 tablet by mouth daily. 06/11/13  Yes Lahoma Crocker, MD  traZODone (DESYREL) 50 MG tablet Take 50 mg by mouth at bedtime.   Yes Historical Provider, MD   BP 124/93  Pulse 75  Temp(Src) 99 F (37.2 C) (Oral)  Resp 15  SpO2 100% Physical Exam  Nursing note and vitals reviewed. Constitutional:  Awake, alert, nontoxic appearance with baseline speech for patient.  HENT:  Head: Atraumatic.  Mouth/Throat: No oropharyngeal exudate.  Patient does have hematoma and local swelling to lateral right side of tongue without laceration.  Eyes: EOM are normal. Pupils are equal, round, and reactive to light. Right eye exhibits no discharge. Left eye exhibits no discharge.  Neck: Neck supple.  Cardiovascular: Normal rate and regular rhythm.   No murmur heard. Pulmonary/Chest: Effort normal and breath sounds normal. No stridor.  No respiratory distress. She has no wheezes. She has no rales. She exhibits no tenderness.  Abdominal: Soft. Bowel sounds are normal. She exhibits no mass. There is no tenderness. There is no rebound.  Musculoskeletal: She exhibits no tenderness.  Baseline ROM, moves extremities with no obvious new focal weakness.  Lymphadenopathy:    She has no cervical adenopathy.  Neurological:  Awake, alert, cooperative and aware of situation; motor strength bilaterally; sensation normal to light touch bilaterally; peripheral visual fields full to confrontation; no facial asymmetry; tongue midline; major cranial  nerves appear intact; baseline gait without new ataxia.  Skin: No rash noted.  Psychiatric: She has a normal mood and affect.    ED Course  Procedures (including critical care time) Labs Review Labs Reviewed  COMPREHENSIVE METABOLIC PANEL - Abnormal; Notable for the following:    Sodium 136 (*)    Glucose, Bld 108 (*)    Alkaline Phosphatase 23 (*)    GFR calc non Af Amer 68 (*)    GFR calc Af Amer 79 (*)    All other components within normal limits  CBC WITH DIFFERENTIAL    Imaging Review No results found.   EKG Interpretation None      Date: 10/28/2013  Rate: 80 bpm  Rhythm: normal sinus rhythm  QRS Axis: normal  Intervals: normal  ST/T Wave abnormalities: normal  Conduction Disutrbances:none  Narrative Interpretation: Sinus  Old EKG Reviewed: unchanged   MDM  Vitals stable - WNL -afebrile Pt resting comfortably in ED. patient back to baseline per her family. She feels well and is ready to go home and finish the football game. PE not concerning for status epilepticus, no post seizure sequelae apparent at this time, no concern for CNS infx. Patient reports having good followup with her neurologist and will schedule an appointment for further evaluation and management.  The patient is in good condition and is stable for discharge at this time. Discussed f/u with PCP and return precautions, pt very amenable to plan.   Prior to patient discharge, I discussed and reviewed this case with Dr.Wofford    Final diagnoses:  Seizure disorder       Verl Dicker, PA-C 10/29/13 [place], PA-C 10/29/13 1344

## 2013-10-28 NOTE — ED Notes (Signed)
She was heard by her family to "cry out" and they went into her room to find her having a tonic-clonic seizure.  She has had seizures for some time; and is on Keppra for same.  She states this deviates a bit from her norm of generally having "seizures usually at night".  She is oriented x 4 with clear speech.  She is easily able to follow commands with all extremities.

## 2013-10-28 NOTE — ED Provider Notes (Signed)
Medical screening examination/treatment/procedure(s) were conducted as a shared visit with non-physician practitioner(s) and myself.  I personally evaluated the patient during the encounter.   EKG Interpretation None      48 year old female with a history of epilepsy who presents after a seizure. Her friend states that the seizure lasted about 4 minutes, started with a guttural cry, and generalized tonic-clonic activity, and ended with a post ictal period marked with confusion.  Patient now back to baseline.  On exam, well appearing, nontoxic, not distressed, normal respiratory effort, normal perfusion, alert, oriented. Patient appears stable for discharge home with followup with her neurologist. No signs or symptoms concerning for CNS infection.  Clinical Impression: 1. Seizure disorder       Arbie Cookey, MD 10/28/13 1901

## 2013-10-31 NOTE — ED Provider Notes (Signed)
Medical screening examination/treatment/procedure(s) were conducted as a shared visit with non-physician practitioner(s) and myself.  I personally evaluated the patient during the encounter.   EKG Interpretation   Date/Time:  Sunday October 28 2013 17:11:39 EDT Ventricular Rate:  80 PR Interval:  201 QRS Duration: 87 QT Interval:  379 QTC Calculation: 437 R Axis:   86 Text Interpretation:  Sinus rhythm Consider left atrial enlargement ED  PHYSICIAN INTERPRETATION AVAILABLE IN [facility] Confirmed by TEST,  Record ([id number]) on 10/30/2013 7:22:55 AM        Houston Siren III, MD 10/31/13 [phone]

## 2013-11-02 ENCOUNTER — Encounter: Payer: Self-pay | Admitting: Nurse Practitioner

## 2013-11-02 ENCOUNTER — Ambulatory Visit (INDEPENDENT_AMBULATORY_CARE_PROVIDER_SITE_OTHER): Payer: Medicaid Other | Admitting: Nurse Practitioner

## 2013-11-02 VITALS — BP 129/86 | HR 74 | Ht 66.0 in | Wt 139.4 lb

## 2013-11-02 DIAGNOSIS — F3289 Other specified depressive episodes: Secondary | ICD-10-CM

## 2013-11-02 DIAGNOSIS — F32A Depression, unspecified: Secondary | ICD-10-CM

## 2013-11-02 DIAGNOSIS — G40309 Generalized idiopathic epilepsy and epileptic syndromes, not intractable, without status epilepticus: Secondary | ICD-10-CM

## 2013-11-02 DIAGNOSIS — R413 Other amnesia: Secondary | ICD-10-CM

## 2013-11-02 DIAGNOSIS — F329 Major depressive disorder, single episode, unspecified: Secondary | ICD-10-CM

## 2013-11-02 MED ORDER — SERTRALINE HCL 25 MG PO TABS: 25.0000 mg | ORAL_TABLET | Freq: Every day | ORAL | Status: DC

## 2013-11-02 NOTE — Patient Instructions (Signed)
We will order a MRI of the brain,  An EEG, and a referral for neuropsychology evaluation.  Someone will call you to schedule these appointments.   1. Increase LEV ER to 1000 mg daily at bedtime. 2. Continue Trazodone 50 mg at hs for depression and insomnia.   3. Start Sertraline 25 mg nightly for depression. 4. Follow up in 2 months, sooner as needed.   Managing Seizure Triggers: Tips for Lifestyle Modification Adapted from the [place], [facility], [place], [place] and the journal "Clinical Nursing Practice in Epilepsy", Spring 1994.  Developing plans to modify your lifestyle is an important part of seizure preparedness. It's a way that you, as a person with seizures or a parent of a child with seizures, can take charge and play an active role in your epilepsy care. The following tips are examples of what people can do to manage triggers. Some of these tips may require a change in behavior, others may be ways to adjust your environment or schedule so not everything happens at once. Before choosing tips to try, make sure you've assessed your situation and talked to your doctor and other health care professionals for their suggestions too. Please note that research on the effectiveness of many of these techniques is limited. Many of these tips are common sense suggestions or are from health care professionals and people with epilepsy as to what they have seen and tried.  Noises: People who think they are affected by noises should be sure to talk to their doctor about whether they have a form of 'reflex epilepsy' or if general noise or distraction may be a trigger in another way. People with true reflex epilepsy may respond to specific seizure medicines and should talk to their doctor. Try using earplugs or earphones, especially in noisy or crowded places. Try listening to relaxing music or sounds, or try distracting yourself by singing or  focusing on another activity.  Bright, flashing or fluorescent lights: Use polarized or tinted glasses. Use natural lighting when indoors. Focus on distant objects when riding in a car to avoid flickering lights or patterns. Avoid discos, strobe lights or flashing bulbs on holiday decorations. Use computer monitor with minimal contrast glare or use a screen filter. Consult with your doctor about other specific recommendations for computer use.  Sleep: Try to regulate sleeping habits so you have a consistent schedule and get enough sleep. Keep a log or diary of your sleep patterns, seizures and general well-being. Ask a partner or companion to record his or her observations too. Consider the following ideas to improve sleep.  . Discuss your medicine schedule with your doctor or nurse. Changing times or doses at night may help sleep. . Limit caffeine and try to avoid it after noon time or mid?afternoon at the latest. . Avoid alcohol and nicotine prior to sleep. . Limit working or studying late at night. Stop work at least one hour before bedtime to allow time to relax. . Exercise in the early evening if possible. . Take warm showers or have someone give you a back rub before bedtime to decrease muscle tension. . Try relaxation exercises before bedtime. . Limit naps and don't nap in the early evening. . If anxious or worried, talk to someone or write down your feelings before going to sleep. Put this away and deal with these worries or concerns in the morning! . If you can't fall asleep within 15 minutes get up and do something else  for 15 minutes. Then go back to bed and try again. Don't toss and turn in bed all night.  Exercise: Regular exercise is good for everyone. Pace your exercise to avoid getting too tired or hyperventilation. Avoid exercising in the middle of the day during hot weather. Ask your doctor about any specific exercises you may need to  avoid.  Hyperventilation: Try relaxation or slow breathing exercises when anxious or if you begin to hyperventilate. Pace your activity and avoid sports that may trigger hyperventilation.  Diet: Regulate meal times and patterns around sleep, activity, and medication schedules. Usually taking medicines after food or around meals makes it easier to remember them and may lessen any stomach distress from side effects of medicines. Have a well-balanced diet and eat at consistent times to avoid long periods without food. If your appetite is poor, try small frequent meals instead of skipping meals. Avoid foods and drinks that may aggravate seizures. Not everyone is sensitive to foods, but if you are, talk to your doctor about how to modify your diet. If you are following a diet specifically for your epilepsy, be sure to follow the advice of your doctor and nutritionist.  Alcohol/Drugs: Avoid recreational drugs and talk to your doctor about use of alcohol. Avoid alcohol completely if you're going through high-risk times or have recently had surgery. If you choose to drink alcohol, use 'moderation', drink slowly, and have only one or two glasses at a time. Consider carefully what you drink, avoiding 'hard liquor' or mixed drinks that may have high alcohol content. If alcohol and drugs are a problem for you, talk to your doctor and get professional help.  Hormonal changes: Both men and women may notice a cyclical pattern to their seizures. Record seizures on a calendar and track them in relation to any changes in hormones. Women who are having menstrual cycles should track their cycle days. Women who have stopped having their menses should track other symptoms or changes, while women who are pregnant should track their pregnancy too. The use of hormonal medicines, such as contraceptives or birth control pills as well as hormonal replacement therapy, may affect seizures in some women, so record the dates and  doses of these medicines.  NOTE: some seizure medicines may interfere with the effectiveness of hormonal contraceptives making unexpected or unplanned pregnancy more likely. Be sure to talk to your doctor about all contraceptive use.When seizures cluster around menses or hormone changes, women should try to modify their lifestyle so other triggers don't occur during this high-risk time. Some women may use 'as needed' medicines to help treat seizures associated with menses. Note the use of these on your calendars and seizure preparedness plan.  Illness, fever, trauma: Notify your doctor if you become ill, have a fever, injure yourself seriously, or need other medicines such as antibiotics, painkillers, or cold medicines. Some people may notice that certain medicines can trigger seizures or interfere with seizure medicines. Fevers, other illnesses and injuries may also make you more susceptible and you'll need to monitor your seizures carefully. Try to limit other triggers during these times and talk to your doctor about what medicines you can use.  Stress, anxiety, depression: Emotional stress is a common trigger for some people, and stress can be a cause and symptom of mood problems such as anxiety and depression. Track your stress level and mood in relation to your seizures on your diary. During stressful times, consider ways to modify your lifestyle and manage stress better. Marland Kitchen  Try counseling to help cope with seizures or other problems. . Consider support groups for epilepsy, or groups for stress management, therapy, and other support. . Write down feelings in a diary on a regular basis. It helps you get feelings out, rather than hold them in, and can help you see the issues more clearly. . Use 'time-out' periods. Just like kids may need a time-out when they are overwhelmed or acting out, so too do adults. Giving yourself a time-out allows you to take a step back from the stressor or  situation and think about how best to address it. . Learn relaxation exercises, deep breathing, yoga, or other strategies that help with stress and general well-being. . Tell your doctor and nurse how you feel. The effects of stress can be harmful to your seizures, and your life. When mood changes last longer than expected, you may need help from a mental health professional too. If you feel emotionally unsafe, call your doctor or go to an emergency room to be evaluated.
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
## 2013-11-07 ENCOUNTER — Ambulatory Visit (INDEPENDENT_AMBULATORY_CARE_PROVIDER_SITE_OTHER): Payer: Medicaid Other | Admitting: Obstetrics & Gynecology

## 2013-11-07 VITALS — BP 124/80 | HR 77 | Temp 98.6°F | Wt 140.0 lb

## 2013-11-07 DIAGNOSIS — N899 Noninflammatory disorder of vagina, unspecified: Secondary | ICD-10-CM

## 2013-11-07 DIAGNOSIS — N898 Other specified noninflammatory disorders of vagina: Secondary | ICD-10-CM

## 2013-11-07 DIAGNOSIS — N3000 Acute cystitis without hematuria: Secondary | ICD-10-CM

## 2013-11-07 LAB — POCT URINALYSIS DIPSTICK
Bilirubin, UA: NEGATIVE
GLUCOSE UA: NEGATIVE
Ketones, UA: NEGATIVE
Leukocytes, UA: NEGATIVE
Nitrite, UA: NEGATIVE
Protein, UA: NEGATIVE
RBC UA: NEGATIVE
SPEC GRAV UA: 1.02
Urobilinogen, UA: NEGATIVE
pH, UA: 5

## 2013-11-07 MED ORDER — SULFAMETHOXAZOLE-TMP DS 800-160 MG PO TABS: 1.0000 | ORAL_TABLET | Freq: Two times a day (BID) | ORAL | Status: DC

## 2013-11-07 MED ORDER — FLUCONAZOLE 150 MG PO TABS: 150.0000 mg | ORAL_TABLET | ORAL | Status: DC

## 2013-11-08 ENCOUNTER — Ambulatory Visit (INDEPENDENT_AMBULATORY_CARE_PROVIDER_SITE_OTHER): Payer: Medicaid Other | Admitting: Radiology

## 2013-11-08 DIAGNOSIS — G40309 Generalized idiopathic epilepsy and epileptic syndromes, not intractable, without status epilepticus: Secondary | ICD-10-CM

## 2013-11-08 DIAGNOSIS — R413 Other amnesia: Secondary | ICD-10-CM

## 2013-11-08 LAB — URINE CULTURE
COLONY COUNT: NO GROWTH
Organism ID, Bacteria: NO GROWTH

## 2013-11-09 ENCOUNTER — Encounter: Payer: Self-pay | Admitting: Obstetrics & Gynecology

## 2013-11-09 LAB — POCT WET PREP (WET MOUNT)
Clue Cells Wet Prep Whiff POC: NEGATIVE
KOH Wet Prep POC: NEGATIVE
Trichomonas Wet Prep HPF POC: NEGATIVE

## 2013-11-09 NOTE — Progress Notes (Signed)
Patient ID: Claudia Elliott, female   DOB: Feb 16, 1965, 49 y.o.   MRN: [id number]  C/O vaginal irritation, urinary frequency, dysuria  HPI Claudia Elliott is a 48 y.o. female.  Urinary Tract Infection  This is a new problem. The current episode started in the past 7 days. The problem has been unchanged. There has been no fever. She has tried nothing for the symptoms.  Vaginal Discharge The patient's primary symptoms include a vaginal discharge. Primary symptoms comment: vaginal irritation. This is a new problem. The current episode started in the past 7 days. The problem has been unchanged. The patient is experiencing no pain. The problem affects both sides. She has tried nothing for the symptoms.    Past Medical History  Diagnosis Date  . Seizures   . Abnormal Pap smear     repeat pap  . Heart murmur   . Hypertension   . Anemia     Past Surgical History  Procedure Laterality Date  . Appendectomy      Family History  Problem Relation Age of Onset  . Diabetes Maternal Grandmother   . Hypertension Maternal Grandmother   . Hypertension Mother     Social History History  Substance Use Topics  . Smoking status: Former Research scientist (life sciences)  . Smokeless tobacco: Never Used  . Alcohol Use: 1.2 oz/week    2 Glasses of wine per week    Allergies  Allergen Reactions  . Percocet [Oxycodone-Acetaminophen] Itching    Current Outpatient Prescriptions  Medication Sig Dispense Refill  . levETIRAcetam (KEPPRA XR) 500 MG 24 hr tablet Take 2 tablets (1,000 mg total) by mouth at bedtime.  60 tablet  0  . LO LOESTRIN FE 1 MG-10 MCG / 10 MCG tablet Take 1 tablet by mouth daily.  1 Package  11  . traZODone (DESYREL) 50 MG tablet Take 50 mg by mouth at bedtime.      . fluconazole (DIFLUCAN) 150 MG tablet Take 1 tablet (150 mg total) by mouth every other day.  2 tablet  0  . sertraline (ZOLOFT) 25 MG tablet Take 1 tablet (25 mg total) by mouth daily.  30 tablet  2  . sulfamethoxazole-trimethoprim (BACTRIM  DS) 800-160 MG per tablet Take 1 tablet by mouth 2 (two) times daily. Take 1 tablet by mouth 2 times daily for 3 days.  30 tablet  2   No current facility-administered medications for this visit.    Review of Systems Review of Systems  Genitourinary: Positive for vaginal discharge.   Constitutional: negative for fatigue and weight loss Respiratory: negative for cough and wheezing Cardiovascular: negative for chest pain, fatigue and palpitations Gastrointestinal: negative for abdominal pain and change in bowel habits Integument/breast: negative for nipple discharge Musculoskeletal:negative for myalgias Neurological: negative for gait problems and tremors Behavioral/Psych: negative for abusive relationship, depression Endocrine: negative for temperature intolerance     Blood pressure 124/80, pulse 77, temperature 98.6 F (37 C), weight 63.504 kg (140 lb), last menstrual period 10/19/2013.  Physical Exam Physical Exam General:   alert  Skin:   no rash or abnormalities  Lungs:   clear to auscultation bilaterally  Heart:   regular rate and rhythm, S1, S2 normal, no murmur, click, rub or gallop  Breasts:   normal without suspicious masses, skin or nipple changes or axillary nodes  Abdomen:  normal findings: no organomegaly, soft, non-tender and no hernia  Pelvis:  External genitalia: normal general appearance Urinary system: urethral meatus normal and bladder without fullness, nontender  Vaginal: white vaginal discharge Cervix: normal appearance Adnexa: normal bimanual exam Uterus: anteverted and non-tender, normal size      Data Reviewed None  Assessment    Candida vulvovaginitis UTI    Plan    Orders Placed This Encounter  Procedures  . Urine culture  . POCT urinalysis dipstick  . POCT Wet Prep [facility])   Meds ordered this encounter  Medications  . sulfamethoxazole-trimethoprim (BACTRIM DS) 800-160 MG per tablet    Sig: Take 1 tablet by mouth 2 (two) times  daily. Take 1 tablet by mouth 2 times daily for 3 days.    Dispense:  30 tablet    Refill:  2  . fluconazole (DIFLUCAN) 150 MG tablet    Sig: Take 1 tablet (150 mg total) by mouth every other day.    Dispense:  2 tablet    Refill:  0    Follow up as needed.         JACKSON-MOORE,Helmi Hechavarria A 11/09/2013, 4:40 PM

## 2013-11-09 NOTE — Patient Instructions (Signed)
Urinary Tract Infection Urinary tract infections (UTIs) can develop anywhere along your urinary tract. Your urinary tract is your body's drainage system for removing wastes and extra water. Your urinary tract includes two kidneys, two ureters, a bladder, and a urethra. Your kidneys are a pair of bean-shaped organs. Each kidney is about the size of your fist. They are located below your ribs, one on each side of your spine. CAUSES Infections are caused by microbes, which are microscopic organisms, including fungi, viruses, and bacteria. These organisms are so small that they can only be seen through a microscope. Bacteria are the microbes that most commonly cause UTIs. SYMPTOMS  Symptoms of UTIs may vary by age and gender of the patient and by the location of the infection. Symptoms in young women typically include a frequent and intense urge to urinate and a painful, burning feeling in the bladder or urethra during urination. Older women and men are more likely to be tired, shaky, and weak and have muscle aches and abdominal pain. A fever may mean the infection is in your kidneys. Other symptoms of a kidney infection include pain in your back or sides below the ribs, nausea, and vomiting. DIAGNOSIS To diagnose a UTI, your caregiver will ask you about your symptoms. Your caregiver also will ask to provide a urine sample. The urine sample will be tested for bacteria and white blood cells. White blood cells are made by your body to help fight infection. TREATMENT  Typically, UTIs can be treated with medication. Because most UTIs are caused by a bacterial infection, they usually can be treated with the use of antibiotics. The choice of antibiotic and length of treatment depend on your symptoms and the type of bacteria causing your infection. HOME CARE INSTRUCTIONS  If you were prescribed antibiotics, take them exactly as your caregiver instructs you. Finish the medication even if you feel better after you  have only taken some of the medication.  Drink enough water and fluids to keep your urine clear or pale yellow.  Avoid caffeine, tea, and carbonated beverages. They tend to irritate your bladder.  Empty your bladder often. Avoid holding urine for long periods of time.  Empty your bladder before and after sexual intercourse.  After a bowel movement, women should cleanse from front to back. Use each tissue only once. SEEK MEDICAL CARE IF:   You have back pain.  You develop a fever.  Your symptoms do not begin to resolve within 3 days. SEEK IMMEDIATE MEDICAL CARE IF:   You have severe back pain or lower abdominal pain.  You develop chills.  You have nausea or vomiting.  You have continued burning or discomfort with urination. MAKE SURE YOU:   Understand these instructions.  Will watch your condition.  Will get help right away if you are not doing well or get worse. Document Released: 11/04/2004 Document Revised: 07/27/2011 Document Reviewed: 03/05/2011 [facility] Patient Information 2015 [place], [place]. This information is not intended to replace advice given to you by your health care provider. Make sure you discuss any questions you have with your health care provider. Vaginitis Vaginitis is an inflammation of the vagina. It is most often caused by a change in the normal balance of the bacteria and yeast that live in the vagina. This change in balance causes an overgrowth of certain bacteria or yeast, which causes the inflammation. There are different types of vaginitis, but the most common types are:  Bacterial vaginosis.  Yeast infection (candidiasis).  Trichomoniasis  vaginitis. This is a sexually transmitted infection (STI).  Viral vaginitis.  Atropic vaginitis.  Allergic vaginitis. CAUSES  The cause depends on the type of vaginitis. Vaginitis can be caused by:  Bacteria (bacterial vaginosis).  Yeast (yeast infection).  A parasite (trichomoniasis  vaginitis)  A virus (viral vaginitis).  Low hormone levels (atrophic vaginitis). Low hormone levels can occur during pregnancy, breastfeeding, or after menopause.  Irritants, such as bubble baths, scented tampons, and feminine sprays (allergic vaginitis). Other factors can change the normal balance of the yeast and bacteria that live in the vagina. These include:  Antibiotic medicines.  Poor hygiene.  Diaphragms, vaginal sponges, spermicides, birth control pills, and intrauterine devices (IUD).  Sexual intercourse.  Infection.  Uncontrolled diabetes.  A weakened immune system. SYMPTOMS  Symptoms can vary depending on the cause of the vaginitis. Common symptoms include:  Abnormal vaginal discharge.  The discharge is white, gray, or yellow with bacterial vaginosis.  The discharge is thick, white, and cheesy with a yeast infection.  The discharge is frothy and yellow or greenish with trichomoniasis.  A bad vaginal odor.  The odor is fishy with bacterial vaginosis.  Vaginal itching, pain, or swelling.  Painful intercourse.  Pain or burning when urinating. Sometimes, there are no symptoms. TREATMENT  Treatment will vary depending on the type of infection.   Bacterial vaginosis and trichomoniasis are often treated with antibiotic creams or pills.  Yeast infections are often treated with antifungal medicines, such as vaginal creams or suppositories.  Viral vaginitis has no cure, but symptoms can be treated with medicines that relieve discomfort. Your sexual partner should be treated as well.  Atrophic vaginitis may be treated with an estrogen cream, pill, suppository, or vaginal ring. If vaginal dryness occurs, lubricants and moisturizing creams may help. You may be told to avoid scented soaps, sprays, or douches.  Allergic vaginitis treatment involves quitting the use of the product that is causing the problem. Vaginal creams can be used to treat the symptoms. HOME  CARE INSTRUCTIONS   Take all medicines as directed by your caregiver.  Keep your genital area clean and dry. Avoid soap and only rinse the area with water.  Avoid douching. It can remove the healthy bacteria in the vagina.  Do not use tampons or have sexual intercourse until your vaginitis has been treated. Use sanitary pads while you have vaginitis.  Wipe from front to back. This avoids the spread of bacteria from the rectum to the vagina.  Let air reach your genital area.  Wear cotton underwear to decrease moisture buildup.  Avoid wearing underwear while you sleep until your vaginitis is gone.  Avoid tight pants and underwear or nylons without a cotton panel.  Take off wet clothing (especially bathing suits) as soon as possible.  Use mild, non-scented products. Avoid using irritants, such as:  Scented feminine sprays.  Fabric softeners.  Scented detergents.  Scented tampons.  Scented soaps or bubble baths.  Practice safe sex and use condoms. Condoms may prevent the spread of trichomoniasis and viral vaginitis. SEEK MEDICAL CARE IF:   You have abdominal pain.  You have a fever or persistent symptoms for more than 2-3 days.  You have a fever and your symptoms suddenly get worse. Document Released: 11/22/2006 Document Revised: 10/20/2011 Document Reviewed: 07/08/2011 [facility] Patient Information 2015 [place], [place]. This information is not intended to replace advice given to you by your health care provider. Make sure you discuss any questions you have with your health care provider.

## 2013-11-12 ENCOUNTER — Telehealth: Payer: Self-pay | Admitting: *Deleted

## 2013-11-12 ENCOUNTER — Other Ambulatory Visit: Payer: Self-pay | Admitting: Nurse Practitioner

## 2013-11-12 NOTE — Telephone Encounter (Signed)
Pt called to office requesting Rx for Boric Acid capsules. Return call to pt,  Pt states that she is having severe vaginal irritation with thick white discharge.  Pt states that she was given Diflucan at last visit and has had no relief.  Pt made aware that request would be forwarded to Dr Delsa Sale for review.   Please advise.

## 2013-11-19 NOTE — Progress Notes (Signed)
I reviewed note and agree with plan.   VIKRAM R. PENUMALLI, MD  Certified in Neurology, Neurophysiology and Neuroimaging  [facility] [address], [address] , [place] [address] [phone]   

## 2013-11-21 ENCOUNTER — Ambulatory Visit
Admission: RE | Admit: 2013-11-21 | Discharge: 2013-11-21 | Disposition: A | Discharge status: Home / Self Care | Payer: Medicaid Other | Source: Ambulatory Visit | Attending: Nurse Practitioner | Admitting: Nurse Practitioner

## 2013-11-21 DIAGNOSIS — R413 Other amnesia: Secondary | ICD-10-CM

## 2013-11-21 DIAGNOSIS — G40309 Generalized idiopathic epilepsy and epileptic syndromes, not intractable, without status epilepticus: Secondary | ICD-10-CM

## 2013-11-22 NOTE — Progress Notes (Signed)
Quick Note:  Called to share normal MRI brain results with patient, she verbalized understanding ______

## 2013-11-24 NOTE — Telephone Encounter (Signed)
Needs appointment

## 2013-11-29 NOTE — Procedures (Signed)
   [facility]  EEG (ELECTROENCEPHALOGRAM) REPORT   STUDY DATE: 11/08/13 PATIENT NAME: Claudia Elliott DOB: [birth year] MRN: [id number]  ORDERING CLINICIAN: Andrey Spearman, MD   TECHNOLOGIST: Towana Badger  TECHNIQUE: Electroencephalogram was recorded utilizing standard 10-20 system of lead placement and reformatted into average and bipolar montages.  RECORDING TIME: 30 minutes ACTIVATION: hyperventilation and photic stimulation  CLINICAL INFORMATION: 48 year old female with seizures.  FINDINGS: Background rhythms of 8-9 hertz and 30-40 microvolts. No focal, lateralizing, epileptiform activity or seizures are seen. Patient recorded in the awake state. EKG channel shows 66 beats per minute.  IMPRESSION:  Normal EEG in the awake state.     INTERPRETING PHYSICIAN:  Penni Bombard, MD Certified in Neurology, Neurophysiology and Neuroimaging  [facility] [address] [place], [place] [address] [phone]

## 2013-12-07 NOTE — Telephone Encounter (Signed)
Call placed to pt, no answer.  Left message making her aware that Dr Delsa Sale would like for her to make an appt if she would like for her gyn concerns.  Pt advised to contact office to schedule appt if that's her choice.

## 2013-12-10 ENCOUNTER — Encounter: Payer: Self-pay | Admitting: Obstetrics & Gynecology

## 2013-12-11 NOTE — Progress Notes (Signed)
Quick Note:  Shared results with patient and she verbalized understanding ______

## 2013-12-24 ENCOUNTER — Ambulatory Visit (INDEPENDENT_AMBULATORY_CARE_PROVIDER_SITE_OTHER): Payer: Medicaid Other | Admitting: Nurse Practitioner

## 2013-12-24 ENCOUNTER — Encounter: Payer: Self-pay | Admitting: Nurse Practitioner

## 2013-12-24 VITALS — BP 153/93 | HR 68 | Ht 67.0 in | Wt 137.5 lb

## 2013-12-24 DIAGNOSIS — F329 Major depressive disorder, single episode, unspecified: Secondary | ICD-10-CM

## 2013-12-24 DIAGNOSIS — F32A Depression, unspecified: Secondary | ICD-10-CM

## 2013-12-24 DIAGNOSIS — G40309 Generalized idiopathic epilepsy and epileptic syndromes, not intractable, without status epilepticus: Secondary | ICD-10-CM

## 2013-12-24 MED ORDER — SERTRALINE HCL 50 MG PO TABS: 50.0000 mg | ORAL_TABLET | Freq: Every day | ORAL | Status: DC

## 2013-12-24 MED ORDER — TRAZODONE HCL 50 MG PO TABS: 50.0000 mg | ORAL_TABLET | Freq: Every day | ORAL | Status: DC

## 2013-12-24 MED ORDER — LEVETIRACETAM ER 500 MG PO TB24: 1000.0000 mg | ORAL_TABLET | Freq: Every day | ORAL | Status: DC
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
## 2013-12-24 NOTE — Patient Instructions (Addendum)
Continue Keppra XR at current dose. 1000 mg at bedtime.  Continue Trazodone 50 mg at bedtime.  Increase Sertraline to 50 mg daily.  Follow up in 6 months, sooner as needed.

## 2014-01-02 ENCOUNTER — Ambulatory Visit: Payer: Medicaid Other | Admitting: Nurse Practitioner

## 2014-01-14 NOTE — Progress Notes (Signed)
I reviewed note and agree with plan.   VIKRAM R. PENUMALLI, MD  Certified in Neurology, Neurophysiology and Neuroimaging  [facility] [address], [address] [place], [place] [address] [phone]   

## 2014-02-04 ENCOUNTER — Encounter: Payer: Self-pay | Admitting: *Deleted

## 2014-02-05 ENCOUNTER — Encounter: Payer: Self-pay | Admitting: Obstetrics & Gynecology

## 2014-02-28 DIAGNOSIS — G40309 Generalized idiopathic epilepsy and epileptic syndromes, not intractable, without status epilepticus: Secondary | ICD-10-CM | POA: Diagnosis not present

## 2014-02-28 DIAGNOSIS — R413 Other amnesia: Secondary | ICD-10-CM | POA: Diagnosis not present

## 2014-03-19 DIAGNOSIS — R413 Other amnesia: Secondary | ICD-10-CM

## 2014-03-19 DIAGNOSIS — G40309 Generalized idiopathic epilepsy and epileptic syndromes, not intractable, without status epilepticus: Secondary | ICD-10-CM

## 2014-03-25 ENCOUNTER — Ambulatory Visit: Payer: Medicaid Other | Admitting: Obstetrics

## 2014-06-14 ENCOUNTER — Other Ambulatory Visit: Payer: Self-pay | Admitting: *Deleted

## 2014-06-14 NOTE — Telephone Encounter (Signed)
Fax from pharmacy for refill on Lo LoEstrin.  Pt is due for AEX.  A one time refill will be faxed to pharmacy noting that pt is to contact office for appt.

## 2014-08-01 ENCOUNTER — Encounter: Payer: Self-pay | Admitting: Certified Nurse Midwife

## 2014-08-01 ENCOUNTER — Ambulatory Visit (INDEPENDENT_AMBULATORY_CARE_PROVIDER_SITE_OTHER): Payer: Medicaid Other | Admitting: Certified Nurse Midwife

## 2014-08-01 ENCOUNTER — Other Ambulatory Visit: Payer: Self-pay | Admitting: Certified Nurse Midwife

## 2014-08-01 VITALS — BP 149/99 | HR 68 | Temp 98.7°F | Ht 66.0 in | Wt 141.0 lb

## 2014-08-01 DIAGNOSIS — N951 Menopausal and female climacteric states: Secondary | ICD-10-CM | POA: Diagnosis not present

## 2014-08-01 DIAGNOSIS — Z01419 Encounter for gynecological examination (general) (routine) without abnormal findings: Secondary | ICD-10-CM

## 2014-08-01 DIAGNOSIS — Z0000 Encounter for general adult medical examination without abnormal findings: Secondary | ICD-10-CM | POA: Diagnosis not present

## 2014-08-01 DIAGNOSIS — Z113 Encounter for screening for infections with a predominantly sexual mode of transmission: Secondary | ICD-10-CM

## 2014-08-01 MED ORDER — ESTRADIOL 0.025 MG/24HR TD PTWK: 0.0250 mg | MEDICATED_PATCH | TRANSDERMAL | Status: DC

## 2014-08-01 MED ORDER — PRENATE PIXIE 10-0.6-0.4-200 MG PO CAPS: 1.0000 | ORAL_CAPSULE | Freq: Every day | ORAL | Status: DC

## 2014-08-01 NOTE — Progress Notes (Signed)
Patient ID: Claudia Elliott, female   DOB: [birth year], 49 y.o.   MRN: [id number]    Subjective:        Claudia Elliott is a 49 y.o. female here for a routine exam.  Current complaints: menopausal symptoms.  Having about 30 flashes a day, trouble sleeping is soaking the bed with sweat.  Desires treatment. Has not had a period for about 2 months.   Has increased blood pressure today she states is due to stress.  Loves salty foods and encouraged to cut out salt.  Needs a mammogram.  Hx of epilepsy with about 14 seizures a year mostly at night.  Has a son that is going away to school in August.    Desires STD screening.  Encouraged a dog for help with seizures.   Personal health questionnaire:  Is patient Ashkenazi Jewish, have a family history of breast and/or ovarian cancer: no Is there a family history of uterine cancer diagnosed at age < 24, gastrointestinal cancer, urinary tract cancer, family member who is a Field seismologist syndrome-associated carrier: no Is the patient overweight and hypertensive, family history of diabetes, personal history of gestational diabetes, preeclampsia or PCOS: no Is patient over 90, have PCOS,  family history of premature CHD under age 69, diabetes, smoke, have hypertension or peripheral artery disease:  yes At any time, has a partner hit, kicked or otherwise hurt or frightened you?: no Over the past 2 weeks, have you felt down, depressed or hopeless?: no Over the past 2 weeks, have you felt little interest or pleasure in doing things?:no   Gynecologic History Patient's last menstrual period was 06/13/2014 (approximate). Contraception: abstinence Last Pap: unknown. Results were: normal according to the patient Last mammogram: 07/27/13. Results were: abnormal, had right breast lumpectomy/biopsy  Obstetric History OB History  Gravida Para Term Preterm AB SAB TAB Ectopic Multiple Living  4 1 1  3  3   1     # Outcome Date GA Lbr Len/2nd Weight Sex Delivery Anes PTL Lv  4  Term 12/14/95 [redacted]w[redacted]d  8 lb 9.5 oz (3.898 kg) M Vag-Spont None  Y  3 TAB         N  2 TAB         N  1 TAB         N      Past Medical History  Diagnosis Date  . Seizures   . Abnormal Pap smear     repeat pap  . Heart murmur   . Allergy     Past Surgical History  Procedure Laterality Date  . Appendectomy       Current outpatient prescriptions:  .  levETIRAcetam (KEPPRA XR) 500 MG 24 hr tablet, Take 2 tablets (1,000 mg total) by mouth at bedtime., Disp: 60 tablet, Rfl: 11 .  LO LOESTRIN FE 1 MG-10 MCG / 10 MCG tablet, Take 1 tablet by mouth daily., Disp: 1 Package, Rfl: 11 .  sertraline (ZOLOFT) 50 MG tablet, Take 1 tablet (50 mg total) by mouth daily., Disp: 30 tablet, Rfl: 11 .  traZODone (DESYREL) 50 MG tablet, Take 1 tablet (50 mg total) by mouth at bedtime., Disp: 30 tablet, Rfl: 11 .  estradiol (CLIMARA) 0.025 mg/24hr patch, Place 1 patch (0.025 mg total) onto the skin once a week., Disp: 4 patch, Rfl: 12 .  Prenat-FeAsp-Meth-FA-DHA w/o A (PRENATE PIXIE) 10-0.6-0.4-200 MG CAPS, Take 1 tablet by mouth daily., Disp: 30 capsule, Rfl: 12 Allergies  Allergen Reactions  .  Percocet [Oxycodone-Acetaminophen] Itching    History  Substance Use Topics  . Smoking status: Former Research scientist (life sciences)  . Smokeless tobacco: Never Used  . Alcohol Use: 1.2 oz/week    2 Glasses of wine per week    Family History  Problem Relation Age of Onset  . Diabetes Maternal Grandmother   . Hypertension Maternal Grandmother   . Hypertension Mother       Review of Systems  Constitutional: negative for fatigue and weight loss Respiratory: negative for cough and wheezing Cardiovascular: negative for chest pain, fatigue and palpitations Gastrointestinal: negative for abdominal pain and change in bowel habits Musculoskeletal:negative for myalgias Neurological: negative for gait problems and tremors Behavioral/Psych: negative for abusive relationship, depression Endocrine: + for temperature intolerance, with  hot flashes and night sweats.   Genitourinary:negative for abnormal menstrual periods, genital lesions, sexual problems and vaginal discharge Integument/breast: negative for breast lump, breast tenderness, nipple discharge and skin lesion(s)    Objective:       BP 149/99 mmHg  Pulse 68  Temp(Src) 98.7 F (37.1 C)  Ht 5\' 6"  (1.676 m)  Wt 141 lb (63.957 kg)  BMI 22.77 kg/m2  LMP 06/13/2014 (Approximate) General:   alert  Skin:   no rash or abnormalities  Lungs:   clear to auscultation bilaterally  Heart:   regular rate and rhythm, S1, S2 normal, no murmur, click, rub or gallop  Breasts:   normal without suspicious masses, skin or nipple changes or axillary nodes  Abdomen:  normal findings: no organomegaly, soft, non-tender and no hernia  Pelvis:  External genitalia: normal general appearance Urinary system: urethral meatus normal and bladder without fullness, nontender Vaginal: normal without tenderness, induration or masses Cervix: normal appearance Adnexa: normal bimanual exam Uterus: anteverted and non-tender, normal size   Lab Review Urine pregnancy test Labs reviewed yes Radiologic studies reviewed yes  50% of 30 min visit spent on counseling and coordination of care.   Assessment:    Healthy female exam.   Perimenopausal symptoms   Plan:    Education reviewed: calcium supplements, depression evaluation, low fat, low cholesterol diet, safe sex/STD prevention, self breast exams, skin cancer screening and weight bearing exercise. Hormone replacement therapy: hormone replacement therapy: estrogen patches and follow up appointment recommended. Mammogram ordered. Follow up in: 3 months.   Meds ordered this encounter  Medications  . estradiol (CLIMARA) 0.025 mg/24hr patch    Sig: Place 1 patch (0.025 mg total) onto the skin once a week.    Dispense:  4 patch    Refill:  12  . Prenat-FeAsp-Meth-FA-DHA w/o A (PRENATE PIXIE) 10-0.6-0.4-200 MG CAPS    Sig: Take 1  tablet by mouth daily.    Dispense:  30 capsule    Refill:  12   Orders Placed This Encounter  Procedures  . SureSwab, Vaginosis/Vaginitis Plus  . HIV antibody (with reflex)  . Hepatitis B surface antigen  . RPR  . Hepatitis C antibody  . TSH  . CBC with Differential/Platelet  . Comprehensive metabolic panel  . Cholesterol, total  . Triglycerides  . HDL cholesterol

## 2014-08-02 LAB — CBC WITH DIFFERENTIAL/PLATELET
Basophils Absolute: 0 10*3/uL (ref 0.0–0.1)
Basophils Relative: 1 % (ref 0–1)
Eosinophils Absolute: 0.2 10*3/uL (ref 0.0–0.7)
Eosinophils Relative: 5 % (ref 0–5)
HCT: 43 % (ref 36.0–46.0)
Hemoglobin: 14.5 g/dL (ref 12.0–15.0)
LYMPHS ABS: 1.3 10*3/uL (ref 0.7–4.0)
Lymphocytes Relative: 30 % (ref 12–46)
MCH: 30.3 pg (ref 26.0–34.0)
MCHC: 33.7 g/dL (ref 30.0–36.0)
MCV: 90 fL (ref 78.0–100.0)
MONOS PCT: 9 % (ref 3–12)
MPV: 10.1 fL (ref 8.6–12.4)
Monocytes Absolute: 0.4 10*3/uL (ref 0.1–1.0)
NEUTROS ABS: 2.4 10*3/uL (ref 1.7–7.7)
NEUTROS PCT: 55 % (ref 43–77)
Platelets: 361 10*3/uL (ref 150–400)
RBC: 4.78 MIL/uL (ref 3.87–5.11)
RDW: 13.4 % (ref 11.5–15.5)
WBC: 4.4 10*3/uL (ref 4.0–10.5)

## 2014-08-02 LAB — COMPREHENSIVE METABOLIC PANEL
ALT: 10 U/L (ref 0–35)
AST: 17 U/L (ref 0–37)
Albumin: 4.5 g/dL (ref 3.5–5.2)
Alkaline Phosphatase: 57 U/L (ref 39–117)
BILIRUBIN TOTAL: 0.4 mg/dL (ref 0.2–1.2)
BUN: 9 mg/dL (ref 6–23)
CO2: 27 mEq/L (ref 19–32)
Calcium: 10.1 mg/dL (ref 8.4–10.5)
Chloride: 101 mEq/L (ref 96–112)
Creat: 1.02 mg/dL (ref 0.50–1.10)
Glucose, Bld: 89 mg/dL (ref 70–99)
Potassium: 4.1 mEq/L (ref 3.5–5.3)
Sodium: 136 mEq/L (ref 135–145)
Total Protein: 7.7 g/dL (ref 6.0–8.3)

## 2014-08-02 LAB — TSH: TSH: 1.551 u[IU]/mL (ref 0.350–4.500)

## 2014-08-02 LAB — CHOLESTEROL, TOTAL: Cholesterol: 228 mg/dL — ABNORMAL HIGH (ref 0–200)

## 2014-08-02 LAB — PAP IG AND HPV HIGH-RISK: HPV DNA High Risk: DETECTED — AB

## 2014-08-02 LAB — HEPATITIS B SURFACE ANTIGEN: Hepatitis B Surface Ag: NEGATIVE

## 2014-08-02 LAB — HEPATITIS C ANTIBODY: HCV Ab: NEGATIVE

## 2014-08-02 LAB — RPR

## 2014-08-02 LAB — TRIGLYCERIDES: Triglycerides: 117 mg/dL (ref <150)

## 2014-08-02 LAB — HDL CHOLESTEROL: HDL: 109 mg/dL (ref >=46)

## 2014-08-02 LAB — HIV ANTIBODY (ROUTINE TESTING W REFLEX): HIV: NONREACTIVE

## 2014-08-05 LAB — SURESWAB, VAGINOSIS/VAGINITIS PLUS
Atopobium vaginae: NOT DETECTED Log (cells/mL)
C. PARAPSILOSIS, DNA: NOT DETECTED
C. TROPICALIS, DNA: NOT DETECTED
C. albicans, DNA: NOT DETECTED
C. glabrata, DNA: NOT DETECTED
C. trachomatis RNA, TMA: NOT DETECTED
Gardnerella vaginalis: NOT DETECTED Log (cells/mL)
LACTOBACILLUS SPECIES: NOT DETECTED Log (cells/mL)
MEGASPHAERA SPECIES: NOT DETECTED Log (cells/mL)
N. gonorrhoeae RNA, TMA: NOT DETECTED
T. vaginalis RNA, QL TMA: NOT DETECTED

## 2014-08-06 ENCOUNTER — Other Ambulatory Visit: Payer: Self-pay | Admitting: Certified Nurse Midwife

## 2014-08-06 ENCOUNTER — Telehealth: Payer: Self-pay | Admitting: *Deleted

## 2014-08-06 DIAGNOSIS — N951 Menopausal and female climacteric states: Secondary | ICD-10-CM

## 2014-08-06 MED ORDER — NORETHIN-ETH ESTRAD-FE BIPHAS 1 MG-10 MCG / 10 MCG PO TABS: 1.0000 | ORAL_TABLET | Freq: Every day | ORAL | Status: DC

## 2014-08-06 NOTE — Telephone Encounter (Signed)
Spoke with patient- she is good with the birth control. Reviewed pap result.Colpo scheduled. She is so worried about her result-  I tried to allay her fears, but I think she may need a call from you.

## 2014-08-06 NOTE — Telephone Encounter (Signed)
Patient states she was reading about the Estrogen patch and she saw warnings for seizure disorder patients. She has worked very hard to get her seizures under control and she would rather not risk a new medication at this time. She prefers to stay with the mini pill. LM on her VM- will let her provider know so she can change that for her.

## 2014-08-06 NOTE — Telephone Encounter (Signed)
LoLoestrin was sent to the pharmacy, if that is not the pills she wanted please let me know.  Thank you.

## 2014-08-07 ENCOUNTER — Ambulatory Visit (INDEPENDENT_AMBULATORY_CARE_PROVIDER_SITE_OTHER): Payer: Medicaid Other | Admitting: Diagnostic Neuroimaging

## 2014-08-07 ENCOUNTER — Encounter: Payer: Self-pay | Admitting: Diagnostic Neuroimaging

## 2014-08-07 VITALS — BP 141/91 | HR 71 | Ht 66.0 in | Wt 143.2 lb

## 2014-08-07 DIAGNOSIS — G40309 Generalized idiopathic epilepsy and epileptic syndromes, not intractable, without status epilepticus: Secondary | ICD-10-CM | POA: Diagnosis not present
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
## 2014-08-09 ENCOUNTER — Ambulatory Visit: Payer: Medicaid Other

## 2014-08-14 ENCOUNTER — Telehealth: Payer: Self-pay | Admitting: *Deleted

## 2014-08-14 NOTE — Telephone Encounter (Signed)
Left message on voicemail, no answer when I attempted to reach her.  Stated that if she still had questions to feel free to try to reach me at the clinic.  Thank you.

## 2014-08-14 NOTE — Telephone Encounter (Signed)
Patient returned the call. Attempted to reach the patient and left message for patient to call the office.

## 2014-08-14 NOTE — Telephone Encounter (Signed)
Patient contacted the office stating she needs to reschedule her colposcopy.  Attempted to contact the patient and left message for patient to call the office.

## 2014-08-15 ENCOUNTER — Encounter: Payer: Medicaid Other | Admitting: Obstetrics

## 2014-08-16 ENCOUNTER — Ambulatory Visit: Payer: Medicaid Other

## 2014-08-23 ENCOUNTER — Ambulatory Visit: Payer: Medicaid Other

## 2014-08-25 DIAGNOSIS — G43909 Migraine, unspecified, not intractable, without status migrainosus: Secondary | ICD-10-CM | POA: Insufficient documentation

## 2014-08-25 DIAGNOSIS — G40109 Localization-related (focal) (partial) symptomatic epilepsy and epileptic syndromes with simple partial seizures, not intractable, without status epilepticus: Secondary | ICD-10-CM | POA: Insufficient documentation

## 2014-08-25 DIAGNOSIS — F339 Major depressive disorder, recurrent, unspecified: Secondary | ICD-10-CM | POA: Insufficient documentation

## 2014-08-28 NOTE — Telephone Encounter (Signed)
Consulted with Dr. Jodi Mourning regarding the test results and per ACOG guidelines  Okay to repeat the pap smear in one year. If abnormal next year will schedule colposcopy. Patient advised of recommendations and verbalized understanding.

## 2014-11-01 ENCOUNTER — Ambulatory Visit: Payer: Medicaid Other | Admitting: Certified Nurse Midwife

## 2014-11-20 ENCOUNTER — Ambulatory Visit: Payer: Medicaid Other | Admitting: Certified Nurse Midwife

## 2014-12-10 ENCOUNTER — Other Ambulatory Visit: Payer: Self-pay | Admitting: Certified Nurse Midwife

## 2014-12-11 ENCOUNTER — Ambulatory Visit: Payer: Medicaid Other | Admitting: Certified Nurse Midwife

## 2015-02-12 ENCOUNTER — Ambulatory Visit: Payer: Medicaid Other | Admitting: Diagnostic Neuroimaging

## 2015-04-25 ENCOUNTER — Other Ambulatory Visit: Payer: Self-pay | Admitting: Internal Medicine

## 2015-04-25 DIAGNOSIS — Z1231 Encounter for screening mammogram for malignant neoplasm of breast: Secondary | ICD-10-CM

## 2015-04-29 ENCOUNTER — Other Ambulatory Visit: Payer: Self-pay | Admitting: Internal Medicine

## 2015-04-29 DIAGNOSIS — N644 Mastodynia: Secondary | ICD-10-CM

## 2015-06-23 ENCOUNTER — Other Ambulatory Visit: Payer: Self-pay | Admitting: Internal Medicine

## 2015-06-23 ENCOUNTER — Ambulatory Visit
Admission: RE | Admit: 2015-06-23 | Discharge: 2015-06-23 | Disposition: A | Discharge status: Home / Self Care | Payer: Managed Care, Other (non HMO) | Source: Ambulatory Visit | Attending: Internal Medicine | Admitting: Internal Medicine

## 2015-06-23 DIAGNOSIS — N644 Mastodynia: Secondary | ICD-10-CM

## 2016-03-02 ENCOUNTER — Emergency Department (HOSPITAL_COMMUNITY)
Admission: EM | Admit: 2016-03-02 | Discharge: 2016-03-02 | Disposition: A | Discharge status: Home / Self Care | Payer: Managed Care, Other (non HMO) | Attending: Emergency Medicine | Admitting: Emergency Medicine

## 2016-03-02 ENCOUNTER — Encounter (HOSPITAL_COMMUNITY): Payer: Self-pay | Admitting: Emergency Medicine

## 2016-03-02 ENCOUNTER — Emergency Department (HOSPITAL_COMMUNITY): Payer: Managed Care, Other (non HMO)

## 2016-03-02 DIAGNOSIS — R079 Chest pain, unspecified: Secondary | ICD-10-CM | POA: Diagnosis not present

## 2016-03-02 DIAGNOSIS — Z87891 Personal history of nicotine dependence: Secondary | ICD-10-CM | POA: Insufficient documentation

## 2016-03-02 DIAGNOSIS — F419 Anxiety disorder, unspecified: Secondary | ICD-10-CM | POA: Insufficient documentation

## 2016-03-02 DIAGNOSIS — M791 Myalgia, unspecified site: Secondary | ICD-10-CM

## 2016-03-02 LAB — BASIC METABOLIC PANEL
Anion gap: 12 (ref 5–15)
BUN: 10 mg/dL (ref 6–20)
CHLORIDE: 104 mmol/L (ref 101–111)
CO2: 23 mmol/L (ref 22–32)
Calcium: 9.7 mg/dL (ref 8.9–10.3)
Creatinine, Ser: 1.03 mg/dL — ABNORMAL HIGH (ref 0.44–1.00)
GFR calc non Af Amer: >60 mL/min (ref >60)
Glucose, Bld: 76 mg/dL (ref 65–99)
POTASSIUM: 3.7 mmol/L (ref 3.5–5.1)
SODIUM: 139 mmol/L (ref 135–145)

## 2016-03-02 LAB — I-STAT TROPONIN, ED
Troponin i, poc: 0 ng/mL (ref 0.00–0.08)
Troponin i, poc: 0 ng/mL (ref 0.00–0.08)

## 2016-03-02 LAB — CBC
HEMATOCRIT: 43.8 % (ref 36.0–46.0)
Hemoglobin: 14.8 g/dL (ref 12.0–15.0)
MCH: 29.9 pg (ref 26.0–34.0)
MCHC: 33.8 g/dL (ref 30.0–36.0)
MCV: 88.5 fL (ref 78.0–100.0)
PLATELETS: 318 10*3/uL (ref 150–400)
RBC: 4.95 MIL/uL (ref 3.87–5.11)
RDW: 13 % (ref 11.5–15.5)
WBC: 3.9 10*3/uL — AB (ref 4.0–10.5)

## 2016-03-02 LAB — CK: CK TOTAL: 50 U/L (ref 38–234)

## 2016-03-02 MED ORDER — NAPROXEN 250 MG PO TABS
500.0000 mg | ORAL_TABLET | Freq: Once | ORAL | Status: AC
  Administered 2016-03-02: 500 mg via ORAL
  Filled 2016-03-02: qty 2

## 2016-03-02 MED ORDER — DIAZEPAM 5 MG PO TABS
5.0000 mg | ORAL_TABLET | Freq: Once | ORAL | Status: AC
Start: 2016-03-02 — End: 2016-03-02
  Administered 2016-03-02: 5 mg via ORAL
  Filled 2016-03-02: qty 1

## 2016-03-02 MED ORDER — METHOCARBAMOL 500 MG PO TABS: 500.0000 mg | ORAL_TABLET | Freq: Three times a day (TID) | ORAL | 0 refills | Status: DC | PRN

## 2016-03-02 MED ORDER — METHOCARBAMOL 500 MG PO TABS
1000.0000 mg | ORAL_TABLET | Freq: Once | ORAL | Status: AC
  Administered 2016-03-02: 1000 mg via ORAL
  Filled 2016-03-02: qty 2

## 2016-03-02 NOTE — ED Notes (Signed)
Pt verbalized understanding of d/c instructions and has no further questions. Pt is stable, A&Ox4, VSS. Pt told it is not safe to drive home and will call a taxi. Pt Pt states she understands. Pt is stable and ambulates steadily.

## 2016-03-02 NOTE — ED Triage Notes (Signed)
PT STATES CP SINCE Sunday AND STATES SHE HAS FALLEN SEVERAL TIMES

## 2016-03-02 NOTE — Discharge Instructions (Signed)
Robaxin for muscle pain.  Continue your current medications current dosages. Plan follow up with your primary care physician, psychiatrist, and neurologist as scheduled.

## 2016-03-02 NOTE — ED Provider Notes (Addendum)
[facility] DEPT Provider Note   CSN: [id number] Arrival date & time: 03/02/16  1424   By signing my name below, I, Neta Mends, attest that this documentation has been prepared under the direction and in the presence of Tanna Furry, MD . Electronically Signed: Neta Mends, ED Scribe. 03/02/2016. 6:47 PM.   History   Chief Complaint Chief Complaint  Patient presents with  . Chest Pain    The history is provided by the patient. No language interpreter was used.  HPI Comments:  Claudia Elliott is a 51 y.o. female with PMHx of epilepsy who presents to the Emergency Department complaining of constant chest pain x 2 days. She states that her current pain radiates from chest to her neck down her left arm. Pt also reports arm pain and uncontrollable spasms 2 days ago, and states that she has fallen several times over the past 2 days without head injury or other trauma. Pt complains of associated frequent anxiety and chills. Pt reports that her doctors wanted her to go to the hospital in February to be evaluated for epilepsy. Pt states that she has been taking several medications (including Rexulti, Keppra 1500mg  at night, Zonegran 200mg  at night) for epilepsy, depression, and panic attacks. She has not worked in 6 months. Pt denies other associated symptoms.   Past Medical History:  Diagnosis Date  . Abnormal Pap smear    repeat pap  . Allergy   . Heart murmur   . Seizures ([place])    x 3 , last seizure 06/23/14    Patient Active Problem List   Diagnosis Date Noted  . Depression 11/02/2013  . Cyst of breast, right, diffuse fibrocystic 02/12/2013  . 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 APPENDECTOMY      OB History    Gravida Para Term Preterm AB Living   4 1 1   3 1    SAB TAB Ectopic Multiple Live Births     3     1       Home Medications    Prior to Admission medications   Medication Sig  Start Date End Date Taking? Authorizing Provider  levETIRAcetam (KEPPRA XR) 500 MG 24 hr tablet Take 2 tablets (1,000 mg total) by mouth at bedtime. 12/24/13   Philmore Pali, NP  methocarbamol (ROBAXIN) 500 MG tablet Take 1 tablet (500 mg total) by mouth 3 (three) times daily between meals as needed. 03/02/16   Tanna Furry, MD  Norethindrone-Ethinyl Estradiol-Fe Biphas (LO LOESTRIN FE) 1 MG-10 MCG / 10 MCG tablet Take 1 tablet by mouth daily. 08/06/14   Rachelle A Denney, CNM  Prenat-FeAsp-Meth-FA-DHA w/o A (PRENATE PIXIE) 10-0.6-0.4-200 MG CAPS Take 1 tablet by mouth daily. 08/01/14   Rachelle A Denney, CNM  sertraline (ZOLOFT) 50 MG tablet Take 1 tablet (50 mg total) by mouth daily. 12/24/13   Philmore Pali, NP  traZODone (DESYREL) 50 MG tablet Take 1 tablet (50 mg total) by mouth at bedtime. 12/24/13   Philmore Pali, NP    Family History Family History  Problem Relation Age of Onset  . Diabetes Maternal Grandmother   . Hypertension Maternal Grandmother   . Hypertension Mother     Social History Social History  Substance Use Topics  . Smoking status: Former Research scientist (life sciences)  . Smokeless tobacco: Never Used  . Alcohol use 1.2 oz/week    2 Glasses of wine per week  Allergies   Percocet [oxycodone-acetaminophen]   Review of Systems Review of Systems  Constitutional: Positive for chills. Negative for appetite change, diaphoresis, fatigue and fever.  HENT: Negative for mouth sores, sore throat and trouble swallowing.   Eyes: Negative for visual disturbance.  Respiratory: Negative for cough, chest tightness, shortness of breath and wheezing.   Cardiovascular: Positive for chest pain.  Gastrointestinal: Negative for abdominal distention, abdominal pain, diarrhea, nausea and vomiting.  Endocrine: Negative for polydipsia, polyphagia and polyuria.  Genitourinary: Negative for dysuria, frequency and hematuria.  Musculoskeletal: Positive for myalgias. Negative for gait problem.  Skin: Negative for color  change, pallor and rash.  Neurological: Negative for dizziness, syncope, light-headedness and headaches.  Hematological: Does not bruise/bleed easily.  Psychiatric/Behavioral: Negative for behavioral problems and confusion. The patient is nervous/anxious.   All other systems reviewed and are negative.    Physical Exam Updated Vital Signs BP 139/83   Pulse 75   Temp 97.8 F (36.6 C) (Oral)   Resp 18   Ht 5\' 6"  (1.676 m)   Wt 135 lb (61.2 kg)   LMP 06/13/2014 (Approximate)   SpO2 100%   BMI 21.79 kg/m   Physical Exam  Constitutional: She is oriented to person, place, and time. She appears well-developed and well-nourished. No distress.  Appears anxious and shaky, pleasant and cooperative.   HENT:  Head: Normocephalic.  Eyes: Conjunctivae are normal. Pupils are equal, round, and reactive to light. No scleral icterus.  Neck: Normal range of motion. Neck supple. No thyromegaly present.  Cardiovascular: Normal rate and regular rhythm.  Exam reveals no gallop and no friction rub.   No murmur heard. Pulmonary/Chest: Effort normal and breath sounds normal. No respiratory distress. She has no wheezes. She has no rales.  Abdominal: Soft. Bowel sounds are normal. She exhibits no distension. There is no tenderness. There is no rebound.  Musculoskeletal: Normal range of motion.  Tenderness to right neck, diffusely through the chest, right shoulder, all reproducible with movement.   Neurological: She is alert and oriented to person, place, and time.  Skin: Skin is warm and dry. No rash noted.  Psychiatric: She has a normal mood and affect. Her behavior is normal.     ED Treatments / Results  DIAGNOSTIC STUDIES:  Oxygen Saturation is 99% on RA, normal by my interpretation.    COORDINATION OF CARE:  6:44 PM Discussed treatment plan with pt at bedside and pt agreed to plan.   Labs (all labs ordered are listed, but only abnormal results are displayed) Labs Reviewed  BASIC METABOLIC  PANEL - Abnormal; Notable for the following:       Result Value   Creatinine, Ser 1.03 (*)    All other components within normal limits  CBC - Abnormal; Notable for the following:    WBC 3.9 (*)    All other components within normal limits  CK  I-STAT TROPOININ, ED  I-STAT TROPOININ, ED    EKG  EKG Interpretation  Date/Time:  Tuesday March 02 2016 14:40:53 EST Ventricular Rate:  89 PR Interval:  178 QRS Duration: 92 QT Interval:  412 QTC Calculation: 501 R Axis:   76 Text Interpretation:  Normal sinus rhythm Septal infarct , age undetermined Prolonged QT Abnormal ECG Confirmed by Jeneen Rinks  MD, [place] ([id number]) on 03/02/2016 6:46:29 PM       Radiology Dg Chest 2 View  Result Date: 03/02/2016 CLINICAL DATA:  Chest pain with short of breath for 4 days. EXAM: CHEST  2 VIEW  COMPARISON:  06/03/2013 FINDINGS: Normal heart size. Lungs clear. No pneumothorax. No pleural effusion. IMPRESSION: No active cardiopulmonary disease. Electronically Signed   By: Marybelle Killings M.D.   On: 03/02/2016 15:46    Procedures Procedures (including critical care time)  Medications Ordered in ED Medications  diazepam (VALIUM) tablet 5 mg (5 mg Oral Given 03/02/16 1923)  methocarbamol (ROBAXIN) tablet 1,000 mg (1,000 mg Oral Given 03/02/16 1924)  naproxen (NAPROSYN) tablet 500 mg (500 mg Oral Given 03/02/16 1923)     Initial Impression / Assessment and Plan / ED Course  I have reviewed the triage vital signs and the nursing notes.  Pertinent labs & imaging results that were available during my care of the patient were reviewed by me and considered in my medical decision making (see chart for details).     Patient's pain seems clearly muscular. EKG shows no changes. Initial and follow up troponin normal. CPK normal. Valium. This helped her muscle aches as well as her anxiety. No indication for additional studies at this time. She is appropriate for discharge follow-up with her primary care physician.  We'll give her Robaxin to take as needed for musculoskeletal pain.  Final Clinical Impressions(s) / ED Diagnoses   Final diagnoses:  Chest pain, unspecified type  Muscular pain  Anxiety    New Prescriptions New Prescriptions   METHOCARBAMOL (ROBAXIN) 500 MG TABLET    Take 1 tablet (500 mg total) by mouth 3 (three) times daily between meals as needed.  I personally performed the services described in this documentation, which was scribed in my presence. The recorded information has been reviewed and is accurate.    Tanna Furry, MD 03/02/16 2027    Tanna Furry, MD 03/02/16 2030

## 2016-08-09 ENCOUNTER — Other Ambulatory Visit: Payer: Self-pay | Admitting: Internal Medicine

## 2016-08-09 DIAGNOSIS — N6310 Unspecified lump in the right breast, unspecified quadrant: Secondary | ICD-10-CM

## 2016-08-16 ENCOUNTER — Other Ambulatory Visit: Payer: Managed Care, Other (non HMO)

## 2016-12-29 ENCOUNTER — Encounter (HOSPITAL_COMMUNITY): Payer: Self-pay

## 2017-07-19 HISTORY — PX: KNEE SURGERY: SHX244

## 2017-09-05 ENCOUNTER — Other Ambulatory Visit: Payer: Self-pay | Admitting: Obstetrics & Gynecology

## 2017-09-05 ENCOUNTER — Other Ambulatory Visit (HOSPITAL_COMMUNITY)
Admission: RE | Admit: 2017-09-05 | Discharge: 2017-09-05 | Disposition: A | Discharge status: Home / Self Care | Payer: BLUE CROSS/BLUE SHIELD | Source: Ambulatory Visit | Attending: Obstetrics & Gynecology | Admitting: Obstetrics & Gynecology

## 2017-09-05 ENCOUNTER — Encounter: Payer: Self-pay | Admitting: Obstetrics & Gynecology

## 2017-09-05 ENCOUNTER — Other Ambulatory Visit: Payer: Self-pay

## 2017-09-05 ENCOUNTER — Telehealth: Payer: Self-pay | Admitting: *Deleted

## 2017-09-05 ENCOUNTER — Ambulatory Visit (INDEPENDENT_AMBULATORY_CARE_PROVIDER_SITE_OTHER): Payer: BLUE CROSS/BLUE SHIELD | Admitting: Obstetrics & Gynecology

## 2017-09-05 VITALS — BP 102/70 | HR 76 | Resp 18 | Ht 65.75 in | Wt 160.4 lb

## 2017-09-05 DIAGNOSIS — N6452 Nipple discharge: Secondary | ICD-10-CM

## 2017-09-05 DIAGNOSIS — Z1231 Encounter for screening mammogram for malignant neoplasm of breast: Secondary | ICD-10-CM

## 2017-09-05 DIAGNOSIS — Z0000 Encounter for general adult medical examination without abnormal findings: Secondary | ICD-10-CM

## 2017-09-05 DIAGNOSIS — Z01419 Encounter for gynecological examination (general) (routine) without abnormal findings: Secondary | ICD-10-CM

## 2017-09-05 DIAGNOSIS — N643 Galactorrhea not associated with childbirth: Secondary | ICD-10-CM | POA: Diagnosis not present

## 2017-09-05 DIAGNOSIS — Z124 Encounter for screening for malignant neoplasm of cervix: Secondary | ICD-10-CM

## 2017-09-05 NOTE — Telephone Encounter (Signed)
Called patient informed MMG appt made for 09/09/17 check in 1:50pm appt 2:10pm for Bilateral Diagnostic MMG with poss [place] at [facility] [place].   Patient notified. Verbalized understanding.

## 2017-09-05 NOTE — Progress Notes (Signed)
52 y.o. [id number] DivorcedAfrican AmericanF here for new patient annual exam.  Referral to Dr. Pearson Grippe.    Major issues are migraines and epilepsy.  Was seen by Dr. Jacelyn Grip at [facility].  He is now working from home.  She has a new neurologist that she hasn't seen yet.  Went through menopause in her early 81's.  Was never on HRT.  Feels she has been experiencing significant sweating for the past few years.  Has   Patient's last menstrual period was 06/13/2014 (approximate).          Sexually active: No.  The current method of family planning is abstinence.    Exercising: No.   Smoker:  former  Health Maintenance: Pap:  08/01/14 Neg. HR HPV:+ detected  History of abnormal Pap:  yes MMG:  06/23/15 [place] Right BIRADS2:Benign. F/u 1 year  Colonoscopy:  Never BMD:   Never TDaP:  Current  Pneumonia vaccine(s): n/a Shingrix:   No Hep C testing: 08/01/15 Neg  Screening Labs: PCP   reports that she has quit smoking. She has never used smokeless tobacco. She reports that she drinks about 1.2 oz of alcohol per week. She reports that she does not use drugs.  Past Medical History:  Diagnosis Date  . Abnormal Pap smear    repeat pap  . Allergy   . Depression   . Fibroid   . Heart murmur   . Panic attacks   . Seizures ([place])    x 3 , last seizure 06/23/14    Past Surgical History:  Procedure Laterality Date  . APPENDECTOMY    . KNEE SURGERY Right 07/19/2017    Current Outpatient Medications  Medication Sig Dispense Refill  . busPIRone (BUSPAR) 15 MG tablet Take 1 tablet by mouth daily.  0  . hydrOXYzine (ATARAX/VISTARIL) 10 MG tablet Take 2 tablets by mouth 3 (three) times daily as needed.    . levETIRAcetam (KEPPRA) 1000 MG tablet Take 1 tablet by mouth daily.    Marland Kitchen levETIRAcetam (KEPPRA) 750 MG tablet Take 1 tablet by mouth daily.  0  . LORazepam (ATIVAN) 0.5 MG tablet Take 1 tablet by mouth 2 (two) times daily.    . traZODone (DESYREL) 50 MG tablet Take 1 tablet (50 mg total) by  mouth at bedtime. 30 tablet 11  . tretinoin (RETIN-A) 0.025 % cream daily as needed.    . triamcinolone cream (KENALOG) 0.1 % daily as needed.  0  . zonisamide (ZONEGRAN) 100 MG capsule Take 3 capsules by mouth at bedtime.     No current facility-administered medications for this visit.     Family History  Problem Relation Age of Onset  . Diabetes Maternal Grandmother   . Hypertension Maternal Grandmother   . Hypertension Mother     Review of Systems  All other systems reviewed and are negative.   Exam:   BP 102/70 (BP Location: Right Arm, Patient Position: Sitting, Cuff Size: Large)   Pulse 76   Resp 18   Ht 5' 5.75" (1.67 m)   Wt 160 lb 6.4 oz (72.8 kg)   LMP 06/13/2014 (Approximate)   BMI 26.09 kg/m   Height: 5' 5.75" (167 cm)  Ht Readings from Last 3 Encounters:  09/05/17 5' 5.75" (1.67 m)  03/02/16 5\' 6"  (1.676 m)  08/07/14 5\' 6"  (1.676 m)    General appearance: alert, cooperative and appears stated age Head: Normocephalic, without obvious abnormality, atraumatic Neck: no adenopathy, supple, symmetrical, trachea midline and thyroid normal to  inspection and palpation Lungs: clear to auscultation bilaterally Breasts: normal appearance, no masses or tenderness, clear discharge from right nipple Heart: regular rate and rhythm Abdomen: soft, non-tender; bowel sounds normal; no masses,  no organomegaly Extremities: extremities normal, atraumatic, no cyanosis or edema Skin: Skin color, texture, turgor normal. No rashes or lesions Lymph nodes: Cervical, supraclavicular, and axillary nodes normal. No abnormal inguinal nodes palpated Neurologic: Grossly normal   Pelvic: External genitalia:  no lesions              Urethra:  normal appearing urethra with no masses, tenderness or lesions              Bartholins and Skenes: normal                 Vagina: normal appearing vagina with normal color and discharge, no lesions              Cervix: no lesions              Pap  taken: Yes.   Bimanual Exam:  Uterus:  normal size, contour, position, consistency, mobility, non-tender              Adnexa: normal adnexa and no mass, fullness, tenderness               Rectovaginal: Confirms               Anus:  normal sphincter tone, no lesions  Chaperone was present for exam.  A:  Well Woman with normal exam PMP, no HRT Clear nipple discharge Epilepsy/seizure d/o H/O +HR HPV on pap 2016.  No pap since. Significant hot flashes/night sweats  P:   Mammogram guidelines reviewed.  We will schedule this  pap smear and HR HPV obtained today Prolactin level needs to be obtained Cologuard order placed CMP, CBC, Lipids, TSH, Vit D return annually or prn

## 2017-09-06 LAB — COMPREHENSIVE METABOLIC PANEL
ALBUMIN: 4.7 g/dL (ref 3.5–5.5)
ALT: 11 IU/L (ref 0–32)
AST: 17 IU/L (ref 0–40)
Albumin/Globulin Ratio: 1.9 (ref 1.2–2.2)
Alkaline Phosphatase: 59 IU/L (ref 39–117)
BUN/Creatinine Ratio: 11 (ref 9–23)
BUN: 11 mg/dL (ref 6–24)
Bilirubin Total: 0.5 mg/dL (ref 0.0–1.2)
CO2: 26 mmol/L (ref 20–29)
CREATININE: 0.99 mg/dL (ref 0.57–1.00)
Calcium: 10 mg/dL (ref 8.7–10.2)
Chloride: 100 mmol/L (ref 96–106)
GFR calc Af Amer: 75 mL/min/{1.73_m2} (ref >59)
GFR, EST NON AFRICAN AMERICAN: 65 mL/min/{1.73_m2} (ref >59)
GLOBULIN, TOTAL: 2.5 g/dL (ref 1.5–4.5)
GLUCOSE: 83 mg/dL (ref 65–99)
Potassium: 3.9 mmol/L (ref 3.5–5.2)
Sodium: 140 mmol/L (ref 134–144)
TOTAL PROTEIN: 7.2 g/dL (ref 6.0–8.5)

## 2017-09-06 LAB — LIPID PANEL
CHOL/HDL RATIO: 2.5 ratio (ref 0.0–4.4)
Cholesterol, Total: 209 mg/dL — ABNORMAL HIGH (ref 100–199)
HDL: 85 mg/dL (ref >39)
LDL CALC: 111 mg/dL — AB (ref 0–99)
TRIGLYCERIDES: 65 mg/dL (ref 0–149)
VLDL CHOLESTEROL CAL: 13 mg/dL (ref 5–40)

## 2017-09-06 LAB — CBC
HEMATOCRIT: 47.2 % — AB (ref 34.0–46.6)
HEMOGLOBIN: 15.6 g/dL (ref 11.1–15.9)
MCH: 30.6 pg (ref 26.6–33.0)
MCHC: 33.1 g/dL (ref 31.5–35.7)
MCV: 93 fL (ref 79–97)
Platelets: 387 10*3/uL (ref 150–450)
RBC: 5.09 x10E6/uL (ref 3.77–5.28)
RDW: 13.6 % (ref 12.3–15.4)
WBC: 4 10*3/uL (ref 3.4–10.8)

## 2017-09-06 LAB — VITAMIN D 25 HYDROXY (VIT D DEFICIENCY, FRACTURES): Vit D, 25-Hydroxy: 37.8 ng/mL (ref 30.0–100.0)

## 2017-09-06 LAB — TSH: TSH: 1.91 u[IU]/mL (ref 0.450–4.500)

## 2017-09-06 LAB — PROLACTIN: PROLACTIN: 41 ng/mL — AB (ref 4.8–23.3)

## 2017-09-07 ENCOUNTER — Other Ambulatory Visit: Payer: Self-pay | Admitting: *Deleted

## 2017-09-07 DIAGNOSIS — R7989 Other specified abnormal findings of blood chemistry: Secondary | ICD-10-CM

## 2017-09-07 DIAGNOSIS — E229 Hyperfunction of pituitary gland, unspecified: Principal | ICD-10-CM

## 2017-09-07 DIAGNOSIS — N643 Galactorrhea not associated with childbirth: Secondary | ICD-10-CM

## 2017-09-07 LAB — CYTOLOGY - PAP
DIAGNOSIS: NEGATIVE
HPV (WINDOPATH): NOT DETECTED

## 2017-09-09 ENCOUNTER — Other Ambulatory Visit: Payer: Managed Care, Other (non HMO)

## 2017-09-14 ENCOUNTER — Ambulatory Visit
Admission: RE | Admit: 2017-09-14 | Discharge: 2017-09-14 | Disposition: A | Discharge status: Home / Self Care | Payer: BLUE CROSS/BLUE SHIELD | Source: Ambulatory Visit | Attending: Obstetrics & Gynecology | Admitting: Obstetrics & Gynecology

## 2017-09-14 DIAGNOSIS — N6452 Nipple discharge: Secondary | ICD-10-CM

## 2017-09-16 ENCOUNTER — Ambulatory Visit
Admission: RE | Admit: 2017-09-16 | Discharge: 2017-09-16 | Disposition: A | Discharge status: Home / Self Care | Payer: BLUE CROSS/BLUE SHIELD | Source: Ambulatory Visit | Attending: Obstetrics & Gynecology | Admitting: Obstetrics & Gynecology

## 2017-09-16 DIAGNOSIS — E229 Hyperfunction of pituitary gland, unspecified: Principal | ICD-10-CM

## 2017-09-16 DIAGNOSIS — R7989 Other specified abnormal findings of blood chemistry: Secondary | ICD-10-CM

## 2017-09-16 DIAGNOSIS — N643 Galactorrhea not associated with childbirth: Secondary | ICD-10-CM

## 2017-09-16 MED ORDER — GADOBENATE DIMEGLUMINE 529 MG/ML IV SOLN
7.0000 mL | Freq: Once | INTRAVENOUS | Status: AC | PRN
  Administered 2017-09-16: 7 mL via INTRAVENOUS

## 2017-09-20 ENCOUNTER — Telehealth: Payer: Self-pay | Admitting: Emergency Medicine

## 2017-09-20 NOTE — Telephone Encounter (Signed)
-----   Message from Megan Salon, MD sent at 09/20/2017  3:23 AM EDT ----- Please let pt know her MRI did not show any pituitary lesions/masses/tumors.  The brain MRI was normal.  I will plan to repeat the prolactin next year just to watch the level and make sure it is not changing/increasing.  Thanks.  Out of imaging hold if in it.

## 2017-09-20 NOTE — Telephone Encounter (Signed)
Patient notified of results and verbalized understanding of plan of care.  Will call back prn.  Encounter closed.

## 2017-11-07 ENCOUNTER — Telehealth: Payer: Self-pay | Admitting: Obstetrics & Gynecology

## 2017-11-07 NOTE — Telephone Encounter (Signed)
Last seen 09/05/17. Pain in breast has travelled. There is a knot under her armpit, pain in armpit and shoulder.

## 2017-11-07 NOTE — Telephone Encounter (Signed)
Spoke with patient. Patient reports several small lumps on the side of her right breast, not new lumps. Pain in right axilla with "hard lump". Describes shooting pain "travels around circumference of right arm from axilla". Itching around both nipples, more on right. Clear nipple d/c bilaterally, not new for her. Denies fever/chills or skin changes. Pain has been increasing since knee surgery in June. Last Dx MMG 09/14/17, negative. Patient states she has a hx of right breast biopsy and is concerned. OV scheduled for 10/1 at 2:45pm with Dr. Sabra Heck for further evaluation.   Routing to provider for final review. Patient is agreeable to disposition. Will close encounter.

## 2017-11-08 ENCOUNTER — Encounter: Payer: Self-pay | Admitting: Obstetrics & Gynecology

## 2017-11-08 ENCOUNTER — Ambulatory Visit (INDEPENDENT_AMBULATORY_CARE_PROVIDER_SITE_OTHER): Payer: BLUE CROSS/BLUE SHIELD | Admitting: Obstetrics & Gynecology

## 2017-11-08 VITALS — BP 100/70 | HR 80 | Resp 16 | Ht 65.75 in | Wt 158.4 lb

## 2017-11-08 DIAGNOSIS — R232 Flushing: Secondary | ICD-10-CM | POA: Diagnosis not present

## 2017-11-08 DIAGNOSIS — N644 Mastodynia: Secondary | ICD-10-CM | POA: Diagnosis not present

## 2017-11-08 MED ORDER — GABAPENTIN 100 MG PO CAPS: ORAL_CAPSULE | ORAL | 0 refills | Status: DC

## 2017-11-08 NOTE — Progress Notes (Signed)
GYNECOLOGY  VISIT  CC:   Breast lumps  HPI: 52 y.o. [id number] Divorced Black or [place] American female here for breat mass right breast.  This has been present for a "long time".  Has started having right axillary pain over the past four weeks.  Nothing makes it worse or better.  Sometimes the pain does wake her up at night.  Had diagnostic MMG 09/14/17.  3D imaging was done.    Having a lot of hot flashes.  Would like recommendations for treatment.  Does not desire estrogen therapy.  GYNECOLOGIC HISTORY: Patient's last menstrual period was 06/13/2014 (approximate). Contraception: post menopausal  Menopausal hormone therapy: none  Patient Active Problem List   Diagnosis Date Noted  . 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 Migraine 08/25/2014  . Depression 11/02/2013  . Cyst of breast, right, diffuse fibrocystic 02/12/2013  . 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 Abnormal Pap smear    repeat pap  . Allergy   . Depression   . Fibroid   . Heart murmur   . Panic attacks   . Seizures ([place])    x 3 , last seizure 06/23/14    Past Surgical History:  Procedure Laterality Date  . APPENDECTOMY    . BREAST BIOPSY    . BREAST CYST ASPIRATION    . KNEE SURGERY Right 07/19/2017    MEDS:   Current Outpatient Medications on File Prior to Visit  Medication Sig Dispense Refill  . busPIRone (BUSPAR) 15 MG tablet Take 1 tablet by mouth daily.  0  . DULoxetine (CYMBALTA) 60 MG capsule Take 2 capsules by mouth daily.  3  . hydrOXYzine (ATARAX/VISTARIL) 10 MG tablet Take 2 tablets by mouth 3 (three) times daily as needed.    . levETIRAcetam (KEPPRA) 1000 MG tablet Take 1 tablet by mouth daily.    Marland Kitchen levETIRAcetam (KEPPRA) 750 MG tablet Take 1 tablet by mouth daily.  0  . LORazepam (ATIVAN) 0.5 MG tablet Take 1 tablet by mouth 2 (two) times daily.    . meloxicam (MOBIC) 15 MG tablet Take 1 tablet by mouth daily as needed.  0  . traZODone  (DESYREL) 50 MG tablet Take 1 tablet (50 mg total) by mouth at bedtime. 30 tablet 11  . tretinoin (RETIN-A) 0.025 % cream daily as needed.    . triamcinolone cream (KENALOG) 0.1 % daily as needed.  0  . zonisamide (ZONEGRAN) 100 MG capsule Take 3 capsules by mouth at bedtime.     No current facility-administered medications on file prior to visit.     ALLERGIES: Percocet [oxycodone-acetaminophen]  Family History  Problem Relation Age of Onset  . Diabetes Maternal Grandmother   . Hypertension Maternal Grandmother   . Hypertension Mother     SH:  Divorced, non smoker  Review of Systems  All other systems reviewed and are negative.   PHYSICAL EXAMINATION:    BP 100/70 (BP Location: Right Arm, Patient Position: Sitting, Cuff Size: Large)   Pulse 80   Resp 16   Ht 5' 5.75" (1.67 m)   Wt 158 lb 6.4 oz (71.8 kg)   LMP 06/13/2014 (Approximate)   BMI 25.76 kg/m     Physical Exam  Constitutional: She appears well-developed and well-nourished.  Neck: Normal range of motion. Neck supple. No thyromegaly present.  Cardiovascular: Normal rate.  Respiratory: Effort normal and breath sounds normal. Right breast exhibits tenderness. Right breast exhibits no inverted nipple, no  mass, no nipple discharge and no skin change. Left breast exhibits tenderness. Left breast exhibits no inverted nipple, no mass, no nipple discharge and no skin change. Breasts are symmetrical.    Lymphadenopathy:    She has no cervical adenopathy.     Assessment: Breast pain possibly due to underwire bra No worrisome breast masses Hot flashes  Plan: Pt reassured about breast findings.  Possible changes in bras suggested. Vit E 200IU daily recommended. Trial of gabapentin 100mg  nightly x 7 nights, then 200mg  x 7 nights, then 300mg  recommended.  Pt will give update in 3-4 weeks.   ~15 minutes spent with patient >50% of time was in face to face discussion of above.

## 2017-11-08 NOTE — Patient Instructions (Addendum)
[address], [place], [place] [address]  Phone: [phone]  Vit E

## 2017-12-19 ENCOUNTER — Telehealth: Payer: Self-pay | Admitting: Obstetrics & Gynecology

## 2017-12-19 MED ORDER — GABAPENTIN 300 MG PO CAPS: 300.0000 mg | ORAL_CAPSULE | Freq: Every day | ORAL | 3 refills | Status: DC

## 2017-12-19 NOTE — Telephone Encounter (Signed)
Spoke with patient. Reports she is taking gabapentin 300mg  nightly, this has been helping with hot flashes and she is sleeping much better. Patient requesting RX to [facility].   Last AEX 09/05/17 Next AEX 12/29/18  Rx pended for Gabapentin 300mg , 1 cap po daily, #90/3RF  Routing to Dr. Sabra Heck to review refill request.

## 2017-12-19 NOTE — Telephone Encounter (Signed)
Rx has been signed.  Thanks for the update.  Ok to close encounter.

## 2017-12-19 NOTE — Telephone Encounter (Signed)
Patient called to give an update on a medication she's been trying. She said 3 capsules of gabapentin is working best at night and she will need a new prescription at this dosage. She also said, "Tell Dr. Sabra Heck how much I appreciate her. She's a good doctor."  Geophysical data processor

## 2017-12-20 NOTE — Telephone Encounter (Signed)
Patient notified of refill.   Encounter closed.  

## 2018-04-06 ENCOUNTER — Telehealth: Payer: Self-pay | Admitting: Obstetrics & Gynecology

## 2018-04-06 NOTE — Telephone Encounter (Signed)
Patient would like to speak with nurse about taking gabapentin.

## 2018-04-06 NOTE — Telephone Encounter (Signed)
Spoke with patient.  She takes 3 capsules (900 mg) total by mouth at nighttime. Has been doing well. Decreased hot flashes.  Wants to continue on the 900 mg dosage.  Reviewed Rx, written for one tablet per day (300 mg)  Okay to change Rx to [facility] : Gabapentin 300 mg capsules, 3 capsules QHS, Dispense 270, refills 3  Next annual exam 12/2018

## 2018-04-07 MED ORDER — GABAPENTIN 300 MG PO CAPS: ORAL_CAPSULE | ORAL | 0 refills | Status: DC

## 2018-04-07 NOTE — Telephone Encounter (Signed)
Left detailed message with instructions okay per designated party release form.  Rx sent with taper instructions.

## 2018-04-07 NOTE — Telephone Encounter (Signed)
She should slowly back down because I don't think she needs the 900mg  dosage.  She should decrease to 600mg  for a week and see if that controls her symptoms.  If so, decrease down to the 300mg  again. Thanks.

## 2018-04-07 NOTE — Telephone Encounter (Signed)
She was taking 300mg  nightly in November.  Can you see how she increased to the 900mg ?  I just want to make sure she is really taking 900mg  and not 300mg .  Initial rx was for 100mg  capsules 1 at night and then 2 and then 3.  Please confirm.  Thanks.

## 2018-04-07 NOTE — Telephone Encounter (Signed)
Call to patient again to confirm. She is taking 3 capsules at night. She increased per instructions after prescription was written and when she requested refill new Rx was sent in for one capsul but she confirmed she has been on 3 capsules since November.

## 2018-04-10 NOTE — Telephone Encounter (Signed)
Call to patient to ensure she received instructions left on Friday.  She has not picked up rx from [facility].  Did not have her medication last night and had night sweats.  Patient states she did not know she was to take only 1 tablet of 300 mg cap, not 3 capsules.  Discussed decreasing doses slowly and call back with any symptoms.  Pt agreeable.  She will call [facility] as well to find out if Rx is ready.   Routing to provider and will close encounter.

## 2018-04-25 ENCOUNTER — Other Ambulatory Visit: Payer: Self-pay | Admitting: Obstetrics & Gynecology

## 2018-06-02 ENCOUNTER — Other Ambulatory Visit: Payer: Self-pay | Admitting: Obstetrics & Gynecology

## 2018-06-02 NOTE — Telephone Encounter (Signed)
Medication refill request: gabapentin 300mg  Last AEX:  09-05-17 Next AEX: 12-29-2018 Last MMG (if hormonal medication request): n/a Refill authorized: please approve if appropriate

## 2018-09-28 ENCOUNTER — Other Ambulatory Visit: Payer: Self-pay | Admitting: Obstetrics & Gynecology

## 2018-09-29 NOTE — Telephone Encounter (Signed)
Medication refill request: Gabapentin  Last AEX:  09-05-17 SM  Next AEX: 12-29-2018  Last MMG (if hormonal medication request): 09-14-17 Diagnostic MMG density C/BIRADS 1 negative  Refill authorized: Today, please advise.   Medication pended for #90, 0RF. Please refill if appropriate.

## 2018-10-02 ENCOUNTER — Other Ambulatory Visit: Payer: Self-pay | Admitting: Family Medicine

## 2018-10-02 DIAGNOSIS — Z1231 Encounter for screening mammogram for malignant neoplasm of breast: Secondary | ICD-10-CM

## 2018-10-11 DIAGNOSIS — L680 Hirsutism: Secondary | ICD-10-CM

## 2018-10-11 DIAGNOSIS — L249 Irritant contact dermatitis, unspecified cause: Secondary | ICD-10-CM | POA: Insufficient documentation

## 2018-10-11 HISTORY — DX: Hirsutism: L68.0

## 2018-10-11 HISTORY — DX: Irritant contact dermatitis, unspecified cause: L24.9

## 2018-12-07 ENCOUNTER — Other Ambulatory Visit: Payer: Self-pay | Admitting: Obstetrics & Gynecology

## 2018-12-18 ENCOUNTER — Telehealth: Payer: Self-pay | Admitting: Obstetrics & Gynecology

## 2018-12-18 NOTE — Telephone Encounter (Signed)
Left message to call Sharee Pimple, RN at [facility].    Last AEX 09/05/17 Next AEX 09/05/17

## 2018-12-18 NOTE — Telephone Encounter (Signed)
Patient is calling regarding breast lump. Patient stated that the lump has been aspirated in the past, but patient stated, "This time is different." Patient stated that it aches in the tissue around the lump and up into her underarm. Patient stated that it hurts more when she is wearing a bra.

## 2018-12-18 NOTE — Telephone Encounter (Signed)
Patient is returning a call to Jill. °

## 2018-12-18 NOTE — Telephone Encounter (Signed)
Spoke with patient. Patient reports lump in right breast on the ride side for 2 months. Lump has been present in same location before and has been aspirated. Patient reports change, "something is different". Reports yellow to brown nipple d/c when expressing breast. Burning in the area of the lump into axilla. Worse when wearing a bra. Patient denies redness or swelling, is unclear if warm to touch. Patient took her temp while on the phone, 97.2.   Advised OV needed for further evaluation. Patient began crying, states she does not have transportation at this time, will have to rely on family members. Offered OV today and 11/10, patient declined. OV scheduled for 11/13 at 4:30pm with Dr. Sabra Heck. Offered scheduling options with other providers to meet scheduling needs, patient declined. Advised patient if she can be seen earlier, return call to office. If symptoms worsen or new symptoms develop, return call to office.   Last MMG was a bilateral Dx MMG with L and R ultrasounds on 09/14/17. Negative.

## 2018-12-20 ENCOUNTER — Other Ambulatory Visit: Payer: Self-pay

## 2018-12-22 ENCOUNTER — Encounter: Payer: Self-pay | Admitting: Obstetrics & Gynecology

## 2018-12-22 ENCOUNTER — Other Ambulatory Visit: Payer: Self-pay

## 2018-12-22 ENCOUNTER — Ambulatory Visit (INDEPENDENT_AMBULATORY_CARE_PROVIDER_SITE_OTHER): Payer: Medicare Other | Admitting: Obstetrics & Gynecology

## 2018-12-22 DIAGNOSIS — N644 Mastodynia: Secondary | ICD-10-CM

## 2018-12-22 NOTE — Progress Notes (Signed)
GYNECOLOGY  VISIT  CC:   Breast pain and nipple discharge  HPI: 53 y.o. [id number] Divorced Black or [place] American female here for increased breast pain that is present on the right side.  Pain is a burning pain and she feels there is radiation up into her axilla.  Last MMG is 09/2017.  She does have nipple discharge.  It is not spontaneous.  She only notices this with expression.  It is clear.    Pt is tearful today.  Reports she's had off and on issues for 7 or 8 years without resolution.  Denies any caffeine intake.   Last MMG 8/19.  Pt called to make appt and was advised needed to be seen first due to complaints.     GYNECOLOGIC HISTORY: Patient's last menstrual period was 06/13/2014 (approximate). Contraception: PMP Menopausal hormone therapy: none  Patient Active Problem List   Diagnosis Date Noted  . 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 Migraine 08/25/2014  . Depression 11/02/2013  . Cyst of breast, right, diffuse fibrocystic 02/12/2013  . 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 Abnormal Pap smear    repeat pap  . Allergy   . Depression   . Fibroid   . Heart murmur   . Panic attacks   . Seizures ([place])    x 3 , last seizure 06/23/14    Past Surgical History:  Procedure Laterality Date  . APPENDECTOMY    . BREAST BIOPSY    . BREAST CYST ASPIRATION    . KNEE SURGERY Right 07/19/2017    MEDS:   Current Outpatient Medications on File Prior to Visit  Medication Sig Dispense Refill  . busPIRone (BUSPAR) 15 MG tablet Take 1 tablet by mouth daily.  0  . DULoxetine (CYMBALTA) 60 MG capsule Take 2 capsules by mouth daily.  3  . gabapentin (NEURONTIN) 300 MG capsule TAKE ONE CAPSULE BY MOUTH DAILY AT BEDTIME  90 capsule 0  . hydrOXYzine (ATARAX/VISTARIL) 10 MG tablet Take 2 tablets by mouth 3 (three) times daily as needed.    . levETIRAcetam (KEPPRA) 1000 MG tablet Take 1 tablet by mouth daily.    Marland Kitchen levETIRAcetam  (KEPPRA) 750 MG tablet Take 1 tablet by mouth daily.  0  . LORazepam (ATIVAN) 0.5 MG tablet Take 1 tablet by mouth 2 (two) times daily.    . meloxicam (MOBIC) 15 MG tablet Take 1 tablet by mouth daily as needed.  0  . traZODone (DESYREL) 50 MG tablet Take 1 tablet (50 mg total) by mouth at bedtime. 30 tablet 11  . tretinoin (RETIN-A) 0.025 % cream daily as needed.    . triamcinolone cream (KENALOG) 0.1 % daily as needed.  0  . zonisamide (ZONEGRAN) 100 MG capsule Take 3 capsules by mouth at bedtime.     No current facility-administered medications on file prior to visit.     ALLERGIES: Percocet [oxycodone-acetaminophen]  Family History  Problem Relation Age of Onset  . Diabetes Maternal Grandmother   . Hypertension Maternal Grandmother   . Hypertension Mother     SH:  Divorced, non smoker  Review of Systems  Constitutional: Negative.   Respiratory: Negative.   Cardiovascular: Negative.   Skin: Negative.     PHYSICAL EXAMINATION:   Physical Exam  Constitutional: She is well-developed, well-nourished, and in no distress.  Pulmonary/Chest: Right breast exhibits tenderness. Right breast exhibits no inverted nipple, no mass, no nipple discharge and no skin change.  Left breast exhibits no inverted nipple, no mass, no nipple discharge, no skin change and no tenderness. Breasts are symmetrical.    Lymphadenopathy:    She has no cervical adenopathy.    She has no axillary adenopathy.       Right: No supraclavicular adenopathy present.       Left: No supraclavicular adenopathy present.  Psychiatric:  Tearful and anxious today   Chaperone was present for exam.  Assessment: Right breast pain  Plan: Anti-inflammatories discussed.  Vit E discussed as well as dosing. Diagnostic R MMG with left screening will be scheduled for pt.

## 2018-12-22 NOTE — Patient Instructions (Signed)
Vit E 200 IU daily Motrin 400mg  twice daily

## 2018-12-25 ENCOUNTER — Encounter: Payer: Self-pay | Admitting: Obstetrics & Gynecology

## 2018-12-26 ENCOUNTER — Telehealth: Payer: Self-pay | Admitting: *Deleted

## 2018-12-26 DIAGNOSIS — N644 Mastodynia: Secondary | ICD-10-CM

## 2018-12-26 NOTE — Telephone Encounter (Signed)
Order placed for bilateral Dx MMG and right breast US, if needed.

## 2018-12-26 NOTE — Telephone Encounter (Signed)
-----   Message from Megan Salon, MD sent at 12/25/2018 10:53 PM EST ----- Regarding: MMG Sharee Pimple, This pt was seen late on Friday afternoon with worsening right UOQ breast pain (that has been present off and on for 7-8 years).  She needs MMG on both breasts and diagnostic due to her pain.  So, I think it is a diagnostic bilateral MMG.  Thanks.  Vinnie Level

## 2018-12-26 NOTE — Telephone Encounter (Signed)
Spoke with Joaquim Lai at [facility]. Scheduled for bilateral Dx MMG and right breast US, if needed, on 01/01/19 at 2:50pm, arrive at 2:30pm.   Spoke with patient, advised of appt as seen above. Patient is agreeable to date and time. Contact information provided should patient need to make any changes to appt, she is aware to contact T[facility].   Patient placed in MMG hold.   Routing to provider for final review. Patient is agreeable to disposition. Will close encounter.

## 2018-12-27 ENCOUNTER — Other Ambulatory Visit: Payer: Self-pay

## 2018-12-28 ENCOUNTER — Telehealth: Payer: Self-pay | Admitting: *Deleted

## 2018-12-28 NOTE — Telephone Encounter (Signed)
Left message on voicemail to call and reschedule cancelled appointment. °

## 2018-12-29 ENCOUNTER — Encounter

## 2018-12-29 ENCOUNTER — Ambulatory Visit: Payer: Medicare Other | Admitting: Obstetrics & Gynecology

## 2019-01-01 ENCOUNTER — Ambulatory Visit: Payer: BLUE CROSS/BLUE SHIELD

## 2019-01-01 ENCOUNTER — Other Ambulatory Visit: Payer: Self-pay

## 2019-01-01 ENCOUNTER — Ambulatory Visit
Admission: RE | Admit: 2019-01-01 | Discharge: 2019-01-01 | Disposition: A | Discharge status: Home / Self Care | Payer: Medicare Other | Source: Ambulatory Visit | Attending: Obstetrics & Gynecology | Admitting: Obstetrics & Gynecology

## 2019-01-01 DIAGNOSIS — N644 Mastodynia: Secondary | ICD-10-CM

## 2019-01-23 ENCOUNTER — Telehealth: Payer: Self-pay | Admitting: *Deleted

## 2019-01-23 NOTE — Telephone Encounter (Signed)
Patient is in MMG hold.   Dx bilateral breast MMG completed on 01/01/19 for right breast pain.   Screening MMG in 1 yr recommended. Bi-rads 1  Dr. Sabra Heck -ok to remove from [facility] hold?

## 2019-01-23 NOTE — Telephone Encounter (Signed)
Yes, ok to close encounter and remove from MMG hold.  Thanks.

## 2019-01-24 NOTE — Telephone Encounter (Signed)
Patient removed from MMG hold.   Encounter closed.  

## 2019-01-26 ENCOUNTER — Other Ambulatory Visit: Payer: Self-pay

## 2019-01-30 ENCOUNTER — Ambulatory Visit: Payer: Medicare Other | Admitting: Obstetrics & Gynecology

## 2019-01-30 ENCOUNTER — Telehealth: Payer: Self-pay | Admitting: Obstetrics & Gynecology

## 2019-01-30 NOTE — Progress Notes (Deleted)
53 y.o. [id number] Divorced Black or [place] American female here for annual exam.    Patient's last menstrual period was 06/13/2014 (approximate).          Sexually active: {yes no:314532}  The current method of family planning is {contraception:315051}.    Exercising: {yes no:314532}  {types:19826} Smoker:  {YES NO:22349}  Health Maintenance: Pap:    09/05/17 Neg:Neg HR HPV  08/01/14 Neg. HR HPV:+ detected  History of abnormal Pap:  yes MMG:  01/01/19 BIRADS 1 negative/density c Colonoscopy:  never BMD:   *** TDaP:  Current Pneumonia vaccine(s):  n/a Shingrix:   No Hep C testing: 08/01/15 Neg Screening Labs: PCP   reports that she has quit smoking. She has never used smokeless tobacco. She reports current alcohol use of about 2.0 standard drinks of alcohol per week. She reports that she does not use drugs.  Past Medical History:  Diagnosis Date  . Abnormal Pap smear    repeat pap  . Allergy   . Depression   . Fibroid   . Heart murmur   . Panic attacks   . Seizures ([place])    x 3 , last seizure 06/23/14    Past Surgical History:  Procedure Laterality Date  . APPENDECTOMY    . BREAST BIOPSY    . BREAST CYST ASPIRATION    . KNEE SURGERY Right 07/19/2017    Current Outpatient Medications  Medication Sig Dispense Refill  . busPIRone (BUSPAR) 15 MG tablet Take 1 tablet by mouth daily.  0  . DULoxetine (CYMBALTA) 60 MG capsule Take 2 capsules by mouth daily.  3  . gabapentin (NEURONTIN) 300 MG capsule TAKE ONE CAPSULE BY MOUTH DAILY AT BEDTIME  90 capsule 0  . hydroquinone 4 % cream Apply small pea-sized amount nightly to affected dark spots.    . hydrOXYzine (ATARAX/VISTARIL) 10 MG tablet Take 2 tablets by mouth 3 (three) times daily as needed.    . levETIRAcetam (KEPPRA) 1000 MG tablet Take 1 tablet by mouth daily.    Marland Kitchen levETIRAcetam (KEPPRA) 750 MG tablet Take 1 tablet by mouth daily.  0  . LORazepam (ATIVAN) 0.5 MG tablet Take 1 tablet by mouth 2 (two) times daily.    .  meloxicam (MOBIC) 15 MG tablet Take 1 tablet by mouth daily as needed.  0  . Sulfacetamide Sodium, Acne, 10 % LOTN Apply to face each am.    . traZODone (DESYREL) 50 MG tablet Take 1 tablet (50 mg total) by mouth at bedtime. 30 tablet 11  . tretinoin (RETIN-A) 0.025 % cream daily as needed.    . triamcinolone cream (KENALOG) 0.1 % daily as needed.  0  . zonisamide (ZONEGRAN) 100 MG capsule Take 3 capsules by mouth at bedtime.     No current facility-administered medications for this visit.    Family History  Problem Relation Age of Onset  . Diabetes Maternal Grandmother   . Hypertension Maternal Grandmother   . Hypertension Mother     Review of Systems  Exam:   LMP 06/13/2014 (Approximate)   Height:      Ht Readings from Last 3 Encounters:  11/08/17 5' 5.75" (1.67 m)  09/05/17 5' 5.75" (1.67 m)  03/02/16 5\' 6"  (1.676 m)    General appearance: alert, cooperative and appears stated age Head: Normocephalic, without obvious abnormality, atraumatic Neck: no adenopathy, supple, symmetrical, trachea midline and thyroid {EXAM; THYROID:18604} Lungs: clear to auscultation bilaterally Breasts: {Exam; breast:13139::"normal appearance, no masses or tenderness"} Heart: regular rate  and rhythm Abdomen: soft, non-tender; bowel sounds normal; no masses,  no organomegaly Extremities: extremities normal, atraumatic, no cyanosis or edema Skin: Skin color, texture, turgor normal. No rashes or lesions Lymph nodes: Cervical, supraclavicular, and axillary nodes normal. No abnormal inguinal nodes palpated Neurologic: Grossly normal   Pelvic: External genitalia:  no lesions              Urethra:  normal appearing urethra with no masses, tenderness or lesions              Bartholins and Skenes: normal                 Vagina: normal appearing vagina with normal color and discharge, no lesions              Cervix: {exam; cervix:14595}              Pap taken: {yes no:314532} Bimanual Exam:  Uterus:   {exam; uterus:12215}              Adnexa: {exam; adnexa:12223}               Rectovaginal: Confirms               Anus:  normal sphincter tone, no lesions  Chaperone, ***Terence Lux, CMA, was present for exam.  A:  Well Woman with normal exam  P:   {plan; gyn:5269::"mammogram","pap smear","return annually or prn"}

## 2019-01-30 NOTE — Telephone Encounter (Signed)
Patient arrived 15 minutes late to her aex appointment today. She was very apologetic and was completely understanding of needing to reschedule. She rescheduled to 02/26/19.

## 2019-02-22 ENCOUNTER — Other Ambulatory Visit: Payer: Self-pay

## 2019-02-26 ENCOUNTER — Ambulatory Visit: Payer: Medicare Other | Admitting: Obstetrics & Gynecology

## 2019-02-26 ENCOUNTER — Encounter: Payer: Self-pay | Admitting: Obstetrics & Gynecology

## 2019-02-26 NOTE — Progress Notes (Deleted)
54 y.o. [id number] Divorced Black or [place] American female here for annual exam.    Patient's last menstrual period was 06/13/2014 (approximate).          Sexually active: {yes no:314532}  The current method of family planning is abstinence.    Exercising: {yes NH:2228965  {types:19826} Smoker:  Former  Health Maintenance: Pap:   09/05/17 Neg:Neg HR HPV  08/01/14 Neg. HR HPV:+ detected  History of abnormal Pap:  yes MMG:  01/01/19 BIRADS 1 negative/density c Colonoscopy:  never BMD:   Never TDaP:  *** Pneumonia vaccine(s):  n/a Shingrix:   *** Hep C testing: 08/01/15 Neg Screening Labs: PCP   reports that she has quit smoking. She has never used smokeless tobacco. She reports current alcohol use of about 2.0 standard drinks of alcohol per week. She reports that she does not use drugs.  Past Medical History:  Diagnosis Date  . Abnormal Pap smear    repeat pap  . Allergy   . Depression   . Fibroid   . Heart murmur   . Panic attacks   . Seizures ([place])    x 3 , last seizure 06/23/14    Past Surgical History:  Procedure Laterality Date  . APPENDECTOMY    . BREAST BIOPSY    . BREAST CYST ASPIRATION    . KNEE SURGERY Right 07/19/2017    Current Outpatient Medications  Medication Sig Dispense Refill  . busPIRone (BUSPAR) 15 MG tablet Take 1 tablet by mouth daily.  0  . DULoxetine (CYMBALTA) 60 MG capsule Take 2 capsules by mouth daily.  3  . gabapentin (NEURONTIN) 300 MG capsule TAKE ONE CAPSULE BY MOUTH DAILY AT BEDTIME  90 capsule 0  . hydroquinone 4 % cream Apply small pea-sized amount nightly to affected dark spots.    . hydrOXYzine (ATARAX/VISTARIL) 10 MG tablet Take 2 tablets by mouth 3 (three) times daily as needed.    . levETIRAcetam (KEPPRA) 1000 MG tablet Take 1 tablet by mouth daily.    Marland Kitchen levETIRAcetam (KEPPRA) 750 MG tablet Take 1 tablet by mouth daily.  0  . LORazepam (ATIVAN) 0.5 MG tablet Take 1 tablet by mouth 2 (two) times daily.    . meloxicam (MOBIC) 15 MG  tablet Take 1 tablet by mouth daily as needed.  0  . Sulfacetamide Sodium, Acne, 10 % LOTN Apply to face each am.    . traZODone (DESYREL) 50 MG tablet Take 1 tablet (50 mg total) by mouth at bedtime. 30 tablet 11  . tretinoin (RETIN-A) 0.025 % cream daily as needed.    . triamcinolone cream (KENALOG) 0.1 % daily as needed.  0  . zonisamide (ZONEGRAN) 100 MG capsule Take 3 capsules by mouth at bedtime.     No current facility-administered medications for this visit.    Family History  Problem Relation Age of Onset  . Diabetes Maternal Grandmother   . Hypertension Maternal Grandmother   . Hypertension Mother     Review of Systems  Exam:   LMP 06/13/2014 (Approximate)   Height:      Ht Readings from Last 3 Encounters:  11/08/17 5' 5.75" (1.67 m)  09/05/17 5' 5.75" (1.67 m)  03/02/16 5\' 6"  (1.676 m)    General appearance: alert, cooperative and appears stated age Head: Normocephalic, without obvious abnormality, atraumatic Neck: no adenopathy, supple, symmetrical, trachea midline and thyroid {EXAM; THYROID:18604} Lungs: clear to auscultation bilaterally Breasts: {Exam; breast:13139::"normal appearance, no masses or tenderness"} Heart: regular rate and rhythm  Abdomen: soft, non-tender; bowel sounds normal; no masses,  no organomegaly Extremities: extremities normal, atraumatic, no cyanosis or edema Skin: Skin color, texture, turgor normal. No rashes or lesions Lymph nodes: Cervical, supraclavicular, and axillary nodes normal. No abnormal inguinal nodes palpated Neurologic: Grossly normal   Pelvic: External genitalia:  no lesions              Urethra:  normal appearing urethra with no masses, tenderness or lesions              Bartholins and Skenes: normal                 Vagina: normal appearing vagina with normal color and discharge, no lesions              Cervix: {exam; cervix:14595}              Pap taken: {yes no:314532} Bimanual Exam:  Uterus:  {exam; uterus:12215}               Adnexa: {exam; adnexa:12223}               Rectovaginal: Confirms               Anus:  normal sphincter tone, no lesions  Chaperone, ***Terence Lux, CMA, was present for exam.  A:  Well Woman with normal exam  P:   {plan; gyn:5269::"mammogram","pap smear","return annually or prn"}

## 2019-03-09 ENCOUNTER — Telehealth: Payer: Self-pay | Admitting: Obstetrics & Gynecology

## 2019-03-09 ENCOUNTER — Other Ambulatory Visit: Payer: Self-pay

## 2019-03-09 NOTE — Telephone Encounter (Signed)
Spoke with patient. Patient reports she has had a sore throat for 2 days, always has a cough at night, this is not new for her. Denies any other symptoms. Recommended r/s AEX to later date, patient agreeable. AEX r/s to 04/02/19 at 10:30am. Advised patient to f/u with PCP for further evaluation of symptoms. Patient verbalzies understanding and is agreeable.   Routing to provider for final review. Patient is agreeable to disposition. Will close encounter.

## 2019-03-09 NOTE — Telephone Encounter (Signed)
Patient has an aex appointment 03/13/19 and during the screening questions she said "I woke up with a sore throat this morning.". I informed her a triage nurse would be calling her soon.

## 2019-03-13 ENCOUNTER — Ambulatory Visit: Payer: Medicare Other | Admitting: Obstetrics & Gynecology

## 2019-04-02 ENCOUNTER — Ambulatory Visit: Payer: Medicare Other | Admitting: Obstetrics & Gynecology

## 2019-04-09 ENCOUNTER — Encounter: Payer: Self-pay | Admitting: *Deleted

## 2019-05-22 ENCOUNTER — Ambulatory Visit: Payer: Medicare Other | Attending: Internal Medicine

## 2019-05-22 DIAGNOSIS — Z23 Encounter for immunization: Secondary | ICD-10-CM

## 2019-05-22 NOTE — Progress Notes (Signed)
   Covid-19 Vaccination Clinic  Name:  Claudia Elliott    MRN: [id number] DOB: 11-14-1965  05/22/2019  [name] was observed post Covid-19 immunization for 15 minutes without incident. She was provided with Vaccine Information Sheet and instruction to access the V-Safe system.   [name] was instructed to call 911 with any severe reactions post vaccine: Marland Kitchen Difficulty breathing  . Swelling of face and throat  . A fast heartbeat  . A bad rash all over body  . Dizziness and weakness   Immunizations Administered    Name Date Dose VIS Date Route   Pfizer COVID-19 Vaccine 05/22/2019  3:07 PM 0.3 mL 01/19/2019 Intramuscular   Manufacturer: [place]   Lot: [id number]   [place]: [id number]

## 2019-05-28 ENCOUNTER — Other Ambulatory Visit: Payer: Self-pay

## 2019-05-28 ENCOUNTER — Emergency Department (HOSPITAL_COMMUNITY)
Admission: EM | Admit: 2019-05-28 | Discharge: 2019-05-28 | Disposition: A | Discharge status: Home / Self Care | Payer: Medicare Other | Attending: Emergency Medicine | Admitting: Emergency Medicine

## 2019-05-28 ENCOUNTER — Encounter (HOSPITAL_COMMUNITY): Payer: Self-pay

## 2019-05-28 DIAGNOSIS — G43909 Migraine, unspecified, not intractable, without status migrainosus: Secondary | ICD-10-CM | POA: Insufficient documentation

## 2019-05-28 DIAGNOSIS — Z5321 Procedure and treatment not carried out due to patient leaving prior to being seen by health care provider: Secondary | ICD-10-CM | POA: Diagnosis not present

## 2019-05-28 HISTORY — DX: Migraine, unspecified, not intractable, without status migrainosus: G43.909

## 2019-05-28 NOTE — ED Notes (Signed)
Pt turned labels in to registration then left facility °

## 2019-05-28 NOTE — ED Notes (Signed)
Called patient name in the lobby to be triage and no one responded

## 2019-05-28 NOTE — ED Triage Notes (Signed)
Patient c/o migraine x 3 days. Patient states she has sensitivity to light and sound. Patient denies any N/v.

## 2019-06-12 ENCOUNTER — Ambulatory Visit: Payer: Medicare Other

## 2019-06-19 ENCOUNTER — Ambulatory Visit: Payer: Medicare Other | Attending: Internal Medicine

## 2019-06-19 DIAGNOSIS — Z23 Encounter for immunization: Secondary | ICD-10-CM

## 2019-06-19 NOTE — Progress Notes (Addendum)
   Covid-19 Vaccination Clinic  Name:  Claudia Elliott    MRN: [id number] DOB: [birth year]  06/19/2019   Pt refused to be obversed.  She was provided with Vaccine Information Sheet and instruction to access the V-Safe system. Pt informed [place] is not liable if she refuses observation.   [name] was instructed to call 911 with any severe reactions post vaccine: Marland Kitchen Difficulty breathing  . Swelling of face and throat  . A fast heartbeat  . A bad rash all over body  . Dizziness and weakness   Immunizations Administered    Name Date Dose VIS Date Route   Pfizer COVID-19 Vaccine 06/19/2019  1:17 PM 0.3 mL 04/04/2018 Intramuscular   Manufacturer: [place]   Lot: [id number]   [place]: [id number]

## 2019-12-05 ENCOUNTER — Telehealth: Payer: Self-pay | Admitting: *Deleted

## 2019-12-05 NOTE — Telephone Encounter (Signed)
Patient came into office, request order for breast imaging. Patient states she is due for her screening MMG at [facility]. Reports ongoing pain in right breast and size of right breast has changed, breast is smaller. Patient also reports vaginal dryness.   Patient states she was not aware Dr. Sabra Heck would be leaving, patient is requesting to schedule an AEX now and would like to see Dr. Sabra Heck next year at new location. Patient states she has limited transportation.   Advised patient no available AEX appt, with Dr. Sabra Heck before she leaves. Offered OV to address concerns, can call number for scheduling AEX at [facility] or schedule AEX with another provider in our office. Copy of letter with contact information provided to patient.   OV scheduled for 10/29 at 9:15am with Dr. Sabra Heck. Patient is aware this is a work in appt.  AEX scheduled for 11/8 at 10:30am with Karma Ganja, NP.   Advised patient I will update Dr. Sabra Heck and return call if any changes to appt is recommended. Patient agreeable.   Routing to Dr. Sabra Heck for final review.

## 2019-12-06 NOTE — Progress Notes (Signed)
-GYNECOLOGY  VISIT  CC:   Breast pain, vaginal stinging  HPI: 54 y.o. [id number] Divorced Black or [place] American female here for rt breast pain & vaginitis.  She does have chronic breast pain.  She is having a new pain that is at 12 o'clock and extends downward.  Denies blood from her nipple.  She does not use under wire bra.  Wearing sports bras only.  Last MMG was in 12/2018.  This was a bilateral diagnostic MMG.    Pt reports her vaginal symptoms including stinging and burning.  She feels dry a lot of the time.  She uses Dove plain or sensitive.  She denies vaginal bleeding.  Denies discharge or odor.  She does feel like she has more urinary urgency as well.  Denies dysuria.  Not interested in hormonal therapy at this time if possible.    Has new PCP that she will be seeing.   GYNECOLOGIC HISTORY: Patient's last menstrual period was 06/13/2014 (approximate). Contraception: post menopausal Menopausal hormone therapy: none  Patient Active Problem List   Diagnosis Date Noted  . Hirsutism 10/11/2018  . Irritant dermatitis 10/11/2018  . 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 Migraine 08/25/2014  . Depression 11/02/2013  . Cyst of breast, right, diffuse fibrocystic 02/12/2013  . 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 Abnormal Pap smear    repeat pap  . Allergy   . Depression   . Fibroid   . Heart murmur   . Migraine   . Panic attacks   . Seizures ([place])    x 3 , last seizure 06/23/14    Past Surgical History:  Procedure Laterality Date  . APPENDECTOMY    . BREAST BIOPSY    . BREAST CYST ASPIRATION    . KNEE SURGERY Right 07/19/2017    MEDS:   Current Outpatient Medications on File Prior to Visit  Medication Sig Dispense Refill  . buPROPion (WELLBUTRIN XL) 300 MG 24 hr tablet     . busPIRone (BUSPAR) 15 MG tablet Take 1 tablet by mouth daily.  0  . butalbital-acetaminophen-caffeine (FIORICET) 50-325-40 MG tablet Take  by mouth.    . DULoxetine (CYMBALTA) 60 MG capsule Take 2 capsules by mouth daily.  3  . hydroquinone 4 % cream Apply small pea-sized amount nightly to affected dark spots.    . hydrOXYzine (ATARAX/VISTARIL) 10 MG tablet Take 2 tablets by mouth 3 (three) times daily as needed.    . levETIRAcetam (KEPPRA) 1000 MG tablet Take 1 tablet by mouth daily.    Marland Kitchen levETIRAcetam (KEPPRA) 750 MG tablet Take 1 tablet by mouth daily.  0  . LORazepam (ATIVAN) 0.5 MG tablet Take 1 tablet by mouth 2 (two) times daily.    . propranolol ER (INDERAL LA) 80 MG 24 hr capsule Take 80 mg by mouth daily.    . Sulfacetamide Sodium, Acne, 10 % LOTN Apply to face each am.    . traZODone (DESYREL) 50 MG tablet Take 1 tablet (50 mg total) by mouth at bedtime. 30 tablet 11  . tretinoin (RETIN-A) 0.025 % cream daily as needed.    . triamcinolone cream (KENALOG) 0.1 % daily as needed.  0  . zonisamide (ZONEGRAN) 100 MG capsule Take 3 capsules by mouth at bedtime.     No current facility-administered medications on file prior to visit.    ALLERGIES: Percocet [oxycodone-acetaminophen]  Family History  Problem Relation Age of Onset  . Diabetes  Maternal Grandmother   . Hypertension Maternal Grandmother   . Hypertension Mother     SH:  Divorced, non smoker  Review of Systems  Constitutional: Negative.   HENT: Negative.   Eyes: Negative.   Respiratory: Negative.   Cardiovascular: Negative.   Gastrointestinal: Negative.   Endocrine: Negative.   Genitourinary:       Vaginal dryness/burning & breast pain  Musculoskeletal: Negative.   Skin: Negative.   Allergic/Immunologic: Negative.   Neurological: Negative.   Hematological: Negative.   Psychiatric/Behavioral: Negative.     PHYSICAL EXAMINATION:    BP 120/80   Pulse 70   Resp 16   Wt 163 lb (73.9 kg)   LMP 06/13/2014 (Approximate)   BMI 26.31 kg/m      Physical Exam Constitutional:      Appearance: Normal appearance.  Chest:     Breasts:         Right: Tenderness present. No swelling, bleeding, inverted nipple, mass or nipple discharge.        Left: Tenderness present. No bleeding, inverted nipple, mass or nipple discharge.    Genitourinary:    General: Normal vulva.     Labia:        Right: No rash, tenderness, lesion or injury.        Left: No rash, tenderness, lesion or injury.      Urethra: No urethral swelling or urethral lesion.     Cervix: Normal.     Uterus: Normal.      Adnexa: Right adnexa normal and left adnexa normal.     Comments: Vaginal mucosa pale, c/w atrophic changes, minimal discharge noted Musculoskeletal:     Cervical back: Normal range of motion and neck supple.  Lymphadenopathy:     Upper Body:     Right upper body: No supraclavicular, axillary or pectoral adenopathy.     Left upper body: No supraclavicular, axillary or pectoral adenopathy.     Lower Body: No right inguinal adenopathy. No left inguinal adenopathy.  Neurological:     General: No focal deficit present.     Mental Status: She is alert.  Psychiatric:        Mood and Affect: Mood normal.      Chaperone, Terence Lux, CMA, was present for exam.  Assessment: Breast pain, R>L, chronic problem with no discrete masses noted today Vaginal atrophic changes noted  Plan: Diagnostic MMG offered.  Without discrete mass and long hx of breast pain, I also think it is ok to wait to have screening 3D MMG as this would be in about three weeks.  Pt comfortable with this plan. Option for treatment of vaginal atrophic changes discussed.  Declines hormonal therapy.  Non hormonal options discussed.  Will try Vit e vaginal cream.  rx will be sent to [facility].

## 2019-12-07 ENCOUNTER — Encounter: Payer: Self-pay | Admitting: Obstetrics & Gynecology

## 2019-12-07 ENCOUNTER — Other Ambulatory Visit: Payer: Self-pay

## 2019-12-07 ENCOUNTER — Ambulatory Visit (INDEPENDENT_AMBULATORY_CARE_PROVIDER_SITE_OTHER): Payer: Medicare Other | Admitting: Obstetrics & Gynecology

## 2019-12-07 VITALS — BP 120/80 | HR 70 | Resp 16 | Wt 163.0 lb

## 2019-12-07 DIAGNOSIS — N644 Mastodynia: Secondary | ICD-10-CM | POA: Diagnosis not present

## 2019-12-07 DIAGNOSIS — N952 Postmenopausal atrophic vaginitis: Secondary | ICD-10-CM

## 2019-12-07 NOTE — Telephone Encounter (Signed)
Patient was seen in office on 12/07/19.   Encounter closed.

## 2019-12-09 ENCOUNTER — Encounter: Payer: Self-pay | Admitting: Obstetrics & Gynecology

## 2019-12-09 MED ORDER — NONFORMULARY OR COMPOUNDED ITEM: 3 refills | Status: DC

## 2019-12-17 ENCOUNTER — Ambulatory Visit: Payer: Medicare Other | Admitting: Nurse Practitioner

## 2019-12-21 ENCOUNTER — Other Ambulatory Visit: Payer: Self-pay | Admitting: Obstetrics & Gynecology

## 2019-12-21 DIAGNOSIS — Z1231 Encounter for screening mammogram for malignant neoplasm of breast: Secondary | ICD-10-CM

## 2020-01-17 ENCOUNTER — Ambulatory Visit: Payer: Medicare Other

## 2020-02-28 ENCOUNTER — Ambulatory Visit: Payer: Medicare Other

## 2020-04-10 ENCOUNTER — Ambulatory Visit: Payer: Medicare Other

## 2020-05-30 ENCOUNTER — Inpatient Hospital Stay: Admission: RE | Admit: 2020-05-30 | Payer: Medicare Other | Source: Ambulatory Visit

## 2020-07-18 ENCOUNTER — Ambulatory Visit: Payer: Medicare Other

## 2020-09-10 ENCOUNTER — Inpatient Hospital Stay: Admission: RE | Admit: 2020-09-10 | Payer: Medicare (Managed Care) | Source: Ambulatory Visit

## 2020-10-24 ENCOUNTER — Other Ambulatory Visit: Payer: Self-pay | Admitting: Obstetrics & Gynecology

## 2020-10-24 DIAGNOSIS — Z1231 Encounter for screening mammogram for malignant neoplasm of breast: Secondary | ICD-10-CM

## 2020-10-27 ENCOUNTER — Ambulatory Visit: Payer: Medicare (Managed Care)

## 2020-11-05 ENCOUNTER — Ambulatory Visit: Payer: Medicaid Other

## 2020-12-04 ENCOUNTER — Other Ambulatory Visit: Payer: Self-pay

## 2020-12-04 ENCOUNTER — Ambulatory Visit
Admission: RE | Admit: 2020-12-04 | Discharge: 2020-12-04 | Disposition: A | Discharge status: Home / Self Care | Payer: Medicare (Managed Care) | Source: Ambulatory Visit | Attending: Obstetrics & Gynecology | Admitting: Obstetrics & Gynecology

## 2020-12-04 DIAGNOSIS — Z1231 Encounter for screening mammogram for malignant neoplasm of breast: Secondary | ICD-10-CM

## 2021-01-26 ENCOUNTER — Other Ambulatory Visit (HOSPITAL_BASED_OUTPATIENT_CLINIC_OR_DEPARTMENT_OTHER): Payer: Self-pay

## 2021-01-26 ENCOUNTER — Other Ambulatory Visit: Payer: Self-pay

## 2021-01-26 ENCOUNTER — Other Ambulatory Visit (HOSPITAL_COMMUNITY)
Admission: RE | Admit: 2021-01-26 | Discharge: 2021-01-26 | Disposition: A | Discharge status: Home / Self Care | Payer: Medicare (Managed Care) | Source: Ambulatory Visit | Attending: Obstetrics & Gynecology | Admitting: Obstetrics & Gynecology

## 2021-01-26 ENCOUNTER — Ambulatory Visit (INDEPENDENT_AMBULATORY_CARE_PROVIDER_SITE_OTHER): Payer: Medicare (Managed Care) | Admitting: Obstetrics & Gynecology

## 2021-01-26 ENCOUNTER — Encounter (HOSPITAL_BASED_OUTPATIENT_CLINIC_OR_DEPARTMENT_OTHER): Payer: Self-pay | Admitting: Obstetrics & Gynecology

## 2021-01-26 VITALS — BP 168/118 | HR 79 | Ht 66.5 in | Wt 180.1 lb

## 2021-01-26 DIAGNOSIS — Z9189 Other specified personal risk factors, not elsewhere classified: Secondary | ICD-10-CM

## 2021-01-26 DIAGNOSIS — R87810 Cervical high risk human papillomavirus (HPV) DNA test positive: Secondary | ICD-10-CM

## 2021-01-26 DIAGNOSIS — Z1151 Encounter for screening for human papillomavirus (HPV): Secondary | ICD-10-CM | POA: Diagnosis not present

## 2021-01-26 DIAGNOSIS — B977 Papillomavirus as the cause of diseases classified elsewhere: Secondary | ICD-10-CM | POA: Diagnosis not present

## 2021-01-26 DIAGNOSIS — Z124 Encounter for screening for malignant neoplasm of cervix: Secondary | ICD-10-CM

## 2021-01-26 DIAGNOSIS — Z01419 Encounter for gynecological examination (general) (routine) without abnormal findings: Secondary | ICD-10-CM | POA: Diagnosis present

## 2021-01-26 DIAGNOSIS — I10 Essential (primary) hypertension: Secondary | ICD-10-CM

## 2021-01-26 DIAGNOSIS — R635 Abnormal weight gain: Secondary | ICD-10-CM | POA: Diagnosis not present

## 2021-01-26 LAB — HEMOGLOBIN A1C
Est. average glucose Bld gHb Est-mCnc: 105 mg/dL
Hgb A1c MFr Bld: 5.3 % (ref 4.8–5.6)

## 2021-01-26 MED ORDER — CLONIDINE HCL 0.1 MG PO TABS
0.1000 mg | ORAL_TABLET | Freq: Once | ORAL | Status: AC
  Administered 2021-01-26: 16:00:00 0.1 mg via ORAL

## 2021-01-26 MED ORDER — NONFORMULARY OR COMPOUNDED ITEM: 3 refills | Status: DC

## 2021-01-26 MED ORDER — AMLODIPINE BESYLATE 5 MG PO TABS
5.0000 mg | ORAL_TABLET | Freq: Every day | ORAL | 1 refills | Status: DC
  Filled 2021-01-26: qty 30, 30d supply, fill #0
  Filled 2021-02-28: qty 30, 30d supply, fill #1

## 2021-01-26 NOTE — Progress Notes (Signed)
55 y.o. [id number] Divorced Black or [place] American female here for breast and pelvic exam.  Frustrated with her weight.  Really, really frustrated with her weight.  Has gained about 20 pounds in the past year.  Wants something done and wants medication.  Denies vaginal bleeding.  BP is elevated today.  H/O propranolol use.  Off for several months.  Ran out of this.  Having a headache but this is chronic.  Visual changes are stable.  Has chronic issues in one eye but this is not new.  D/w pt importance of managing blood pressure for many reasons.     Patient's last menstrual period was 06/13/2014 (approximate).          Sexually active: No.  H/O STD:  yes, h/o +HR HPV  Health Maintenance: PCP:  needs new one  Vaccines are up to date:  declines flu shot and shingles vaccination Colonoscopy:  04/2020 done at [facility].  Has ischemic colitis.   MMG:  12/04/2020 Negative BMD:  not yet Last pap smear:  09/05/2017.  Negative   reports that she has quit smoking. She has never used smokeless tobacco. She reports current alcohol use of about 2.0 standard drinks per week. She reports that she does not use drugs.  Past Medical History:  Diagnosis Date   Abnormal Pap smear    repeat pap   Allergy    Depression    Fibroid    Heart murmur    Migraine    Panic attacks    Seizures (HCC)    x 3 , last seizure 06/23/14    Past Surgical History:  Procedure Laterality Date   APPENDECTOMY     BREAST BIOPSY Right 2013   BREAST CYST ASPIRATION Right 12/2012   KNEE SURGERY Right 07/19/2017    Current Outpatient Medications  Medication Sig Dispense Refill   buPROPion (WELLBUTRIN XL) 300 MG 24 hr tablet      busPIRone (BUSPAR) 15 MG tablet Take 1 tablet by mouth daily.  0   DULoxetine (CYMBALTA) 60 MG capsule Take 2 capsules by mouth daily.  3   hydroquinone 4 % cream Apply small pea-sized amount nightly to affected dark spots.     hydrOXYzine (ATARAX/VISTARIL) 10 MG tablet Take 2 tablets by  mouth 3 (three) times daily as needed.     levETIRAcetam (KEPPRA) 1000 MG tablet Take 1 tablet by mouth daily.     levETIRAcetam (KEPPRA) 750 MG tablet Take 1 tablet by mouth daily.  0   LORazepam (ATIVAN) 0.5 MG tablet Take 1 tablet by mouth 2 (two) times daily.     NONFORMULARY OR COMPOUNDED ITEM Vitamin E vaginal cream 200u/ml.  One ml two to three times weekly.  Disp: 3 month supply. 36 each 3   Sulfacetamide Sodium, Acne, 10 % LOTN Apply to face each am.     traZODone (DESYREL) 50 MG tablet Take 1 tablet (50 mg total) by mouth at bedtime. 30 tablet 11   tretinoin (RETIN-A) 0.025 % cream daily as needed.     triamcinolone cream (KENALOG) 0.1 % daily as needed.  0   zonisamide (ZONEGRAN) 100 MG capsule Take 3 capsules by mouth at bedtime.     propranolol ER (INDERAL LA) 80 MG 24 hr capsule Take 80 mg by mouth daily. (Patient not taking: Reported on 01/26/2021)     No current facility-administered medications for this visit.    Family History  Problem Relation Age of Onset   Diabetes Maternal Grandmother  Hypertension Maternal Grandmother    Hypertension Mother     Review of Systems  Neurological:  Positive for headaches.   Exam:   BP (!) 165/115 (BP Location: Right Arm, Patient Position: Sitting, Cuff Size: Normal)    Pulse 79    Ht 5' 6.5" (1.689 m) Comment: reported   Wt 180 lb 2 oz (81.7 kg)    LMP 06/13/2014 (Approximate)    BMI 28.64 kg/m   Height: 5' 6.5" (168.9 cm) (reported)  General appearance: alert, cooperative and appears stated age Breasts: normal appearance, no masses or tenderness Abdomen: soft, non-tender; bowel sounds normal; no masses,  no organomegaly Lymph nodes: Cervical, supraclavicular, and axillary nodes normal.  No abnormal inguinal nodes palpated Neurologic: Grossly normal  Pelvic: External genitalia:  no lesions              Urethra:  normal appearing urethra with no masses, tenderness or lesions              Bartholins and Skenes: normal                  Vagina: normal appearing vagina with atrophic changes and no discharge, no lesions              Cervix: no lesions              Pap taken: Yes.   Bimanual Exam:  Uterus:  normal size, contour, position, consistency, mobility, non-tender              Adnexa: normal adnexa and no mass, fullness, tenderness               Rectovaginal: Confirms               Anus:  normal sphincter tone, no lesions  Chaperone, Ezekiel Ina, RN, was present for exam.  Assessment/Plan: 1. GYN exam for high-risk Medicare patient - pap and HR HPV obtained today - MMG 11/2020 - colonoscopy 04/2020 - Plan BMD around age 89 - pt needs to establish with new PCP - vaccines reviewed and updated  2. 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 Primary hypertension - reached out to primary care while pt was here.   - cloNIDine (CATAPRES) tablet 0.1 mg x 1 given.  Blood pressure recheck in 20 minutes and was improved with diastolic of 505.   - sara early, NP, primary care recommended pt start norvasc 5mg  daily.  Rx to pharmacy here in building so pt could get today.  She will need recheck with sara in 2-3 weeks.  Will help pt get this scheduled.  4. Weight gain - TSH - VITAMIN D 25 Hydroxy (Vit-D Deficiency, Fractures) - Hemoglobin A1c  5. Cervical cancer screening - Cytology - PAP( [place])

## 2021-01-28 LAB — VITAMIN D 25 HYDROXY (VIT D DEFICIENCY, FRACTURES): Vit D, 25-Hydroxy: 62.2 ng/mL (ref 30.0–100.0)

## 2021-01-28 LAB — TSH: TSH: 3.6 u[IU]/mL (ref 0.450–4.500)

## 2021-01-29 LAB — CYTOLOGY - PAP
Comment: NEGATIVE
Diagnosis: NEGATIVE
High risk HPV: NEGATIVE

## 2021-03-02 ENCOUNTER — Other Ambulatory Visit (HOSPITAL_BASED_OUTPATIENT_CLINIC_OR_DEPARTMENT_OTHER): Payer: Self-pay

## 2021-03-05 ENCOUNTER — Telehealth (INDEPENDENT_AMBULATORY_CARE_PROVIDER_SITE_OTHER): Payer: Medicare (Managed Care) | Admitting: Nurse Practitioner

## 2021-03-05 ENCOUNTER — Other Ambulatory Visit: Payer: Self-pay

## 2021-03-05 ENCOUNTER — Other Ambulatory Visit (HOSPITAL_BASED_OUTPATIENT_CLINIC_OR_DEPARTMENT_OTHER): Payer: Self-pay

## 2021-03-05 ENCOUNTER — Encounter (HOSPITAL_BASED_OUTPATIENT_CLINIC_OR_DEPARTMENT_OTHER): Payer: Self-pay | Admitting: Nurse Practitioner

## 2021-03-05 VITALS — Ht 67.0 in | Wt 177.0 lb

## 2021-03-05 DIAGNOSIS — G40309 Generalized idiopathic epilepsy and epileptic syndromes, not intractable, without status epilepticus: Secondary | ICD-10-CM

## 2021-03-05 DIAGNOSIS — F439 Reaction to severe stress, unspecified: Secondary | ICD-10-CM | POA: Insufficient documentation

## 2021-03-05 DIAGNOSIS — U071 COVID-19: Secondary | ICD-10-CM | POA: Diagnosis not present

## 2021-03-05 DIAGNOSIS — Z0000 Encounter for general adult medical examination without abnormal findings: Secondary | ICD-10-CM

## 2021-03-05 DIAGNOSIS — G40109 Localization-related (focal) (partial) symptomatic epilepsy and epileptic syndromes with simple partial seizures, not intractable, without status epilepticus: Secondary | ICD-10-CM | POA: Diagnosis not present

## 2021-03-05 DIAGNOSIS — F411 Generalized anxiety disorder: Secondary | ICD-10-CM

## 2021-03-05 DIAGNOSIS — R635 Abnormal weight gain: Secondary | ICD-10-CM

## 2021-03-05 DIAGNOSIS — F339 Major depressive disorder, recurrent, unspecified: Secondary | ICD-10-CM

## 2021-03-05 DIAGNOSIS — I10 Essential (primary) hypertension: Secondary | ICD-10-CM | POA: Diagnosis not present

## 2021-03-05 HISTORY — DX: Essential (primary) hypertension: I10

## 2021-03-05 HISTORY — DX: Abnormal weight gain: R63.5

## 2021-03-05 HISTORY — DX: COVID-19: U07.1

## 2021-03-05 MED ORDER — GUAIFENESIN ER 600 MG PO TB12
1200.0000 mg | ORAL_TABLET | Freq: Two times a day (BID) | ORAL | 1 refills | Status: DC
  Filled 2021-03-05: qty 30, 8d supply, fill #0

## 2021-03-05 MED ORDER — LEVETIRACETAM 750 MG PO TABS: 750.0000 mg | ORAL_TABLET | Freq: Every day | ORAL | 3 refills | Status: AC

## 2021-03-05 MED ORDER — BLOOD PRESSURE KIT KIT: PACK | 0 refills | Status: AC

## 2021-03-05 MED ORDER — ACETAMINOPHEN 500 MG PO TABS
1000.0000 mg | ORAL_TABLET | Freq: Three times a day (TID) | ORAL | 1 refills | Status: DC | PRN
  Filled 2021-03-05: qty 60, 10d supply, fill #0

## 2021-03-05 MED ORDER — TRAZODONE HCL 50 MG PO TABS
50.0000 mg | ORAL_TABLET | Freq: Every day | ORAL | 11 refills | Status: AC
  Filled 2021-03-05: qty 30, 30d supply, fill #0

## 2021-03-05 MED ORDER — LEVETIRACETAM 1000 MG PO TABS: 1000.0000 mg | ORAL_TABLET | Freq: Every day | ORAL | 3 refills | Status: AC

## 2021-03-05 MED ORDER — LORAZEPAM 0.5 MG PO TABS
0.5000 mg | ORAL_TABLET | Freq: Every day | ORAL | 2 refills | Status: DC | PRN
  Filled 2021-03-05: qty 30, 30d supply, fill #0

## 2021-03-05 MED ORDER — AMLODIPINE BESYLATE 5 MG PO TABS: 5.0000 mg | ORAL_TABLET | Freq: Every day | ORAL | 1 refills | Status: DC

## 2021-03-05 MED ORDER — PHENTERMINE HCL 37.5 MG PO CAPS
ORAL_CAPSULE | ORAL | 2 refills | Status: DC
  Filled 2021-03-05: qty 30, 30d supply, fill #0
  Filled 2021-04-23: qty 20, 20d supply, fill #1
  Filled 2021-04-23: qty 10, 10d supply, fill #1
  Filled 2021-05-29: qty 30, 30d supply, fill #2

## 2021-03-05 MED ORDER — NIRMATRELVIR/RITONAVIR (PAXLOVID)TABLET
3.0000 | ORAL_TABLET | Freq: Two times a day (BID) | ORAL | 0 refills | Status: AC
  Filled 2021-03-05: qty 30, 5d supply, fill #0

## 2021-03-05 NOTE — Assessment & Plan Note (Signed)
Longstanding history of depression managed with medication and counseling. Recent increase in depressive symptoms may be related to increased dosage of Keppra.  At this time do not want to make any significant changes to medication without full review of chart and medical history.  Do have some concerns with the use of bupropion in the setting of seizure disorder however this is a long-term medication and without complete review of history changes may be inappropriate. Recommend discussion with neurology about bupropion use and risks with current seizure disorder.  Will review medical history to determine if this has been addressed in the past.

## 2021-03-05 NOTE — Assessment & Plan Note (Signed)
Longstanding history of generalized anxiety disorder. Currently on multi medication regimen with therapy. Recommendation to continue current therapy and treatment plan. Will review medical history and determine if changes are necessary for improved control. Patient knows to contact the office if she has any worsening symptoms or changes to her mental health status. No alarm symptoms present today.

## 2021-03-05 NOTE — Assessment & Plan Note (Signed)
New diagnosis of hypertension with recent start of amlodipine 5 mg with OB/GYN. She is a new patient to establish care in this office today.  Unfortunately unable to check blood pressure as her blood pressure cuff at home start working and she is quarantined at this time with COVID-19. Will send prescription to patient for home blood pressure cuff so that she can monitor accurately at home. She is not having any alarm symptoms present today. We will plan to follow-up with the patient in the next few months for further evaluation.  Recent labs reviewed today with no concerning findings.

## 2021-03-05 NOTE — Assessment & Plan Note (Signed)
Positive COVID-19 at home test on 03/05/2021 with symptoms. Patient is considered high risk given medical history. Recommendations for Paxil Oved for antiviral properties in addition to over-the-counter treatments which may be helpful including increased hydration, Mucinex, alternating Tylenol and ibuprofen, and rest. Discussion with patient on alarm symptoms that would warrant immediate evaluation. She expresses understanding today. Unfortunately she is unable to get out of the house to pick up the prescriptions therefore we will work to see if we can get the medications delivered or family member could pick them up for her. She will follow-up if symptoms worsen or fail to improve.

## 2021-03-05 NOTE — Patient Instructions (Signed)
Thank you for choosing [place] at [facility] for your Primary Care needs. I am excited for the opportunity to partner with you to meet your health care goals. It was a pleasure meeting you today!  Recommendations from today's visit: I have sent medications to the pharmacy for you. I would like you to alternate 103m Tylenol and 8086mIbuprofen every 4 hours to keep the fever and pain under control.  I want you to monitor for new or worsening symptoms and let us[place]oreanow if you are having trouble managing your symptoms, keeping your fever to less than 103 with medications, or having shortness of breath.  If you have severe shortness of breath or trouble breathing, please seek emergency care.  We will plan to follow-up in 3 months to check your weight or sooner if you need to be seen.  Please get better soon.   Information on diet, exercise, and health maintenance recommendations are listed below. This is information to help you be sure you are on track for optimal health and monitoring.   Please look over this and let us[place]oreanow if you have any questions or if you have completed any of the health maintenance outside of [place] that we can be sure your records are up to date.  ___________________________________________________________ About Me: I am an Adult-Geriatric Nurse Practitioner with a background in caring for patients for more than 20 years with a strong intensive care background. I provide primary care and sports medicine services to patients age 5361nd older within this office. My education had a strong focus on caring for the older adult population, which I am passionate about. I am also the director of the APP Fellowship [facility]  My desire is to provide you with the best service through preventive medicine and supportive care. I consider you a part of the medical team and value your input. I work diligently to ensure that you are heard and your needs  are met in a safe and effective manner. I want you to feel comfortable with me as your provider and want you to know that your health concerns are important to me.  For your information, our office hours are: Monday, Tuesday, and Thursday 8:00 AM - 5:00 PM Wednesday and Friday 8:00 AM - 12:00 PM.   In my time away from the office I am teaching new APP's within the system and am unavailable, but my partner, Dr. deBurnard Buntings in the office for emergent needs.   If you have questions or concerns, please call our office at [phone] send us[place] MyChart message and we will respond as quickly as possible.  ____________________________________________________________ MyChart:  For all urgent or time sensitive needs we ask that you please call the office to avoid delays. Our number is [phone]. MyChart is not constantly monitored and due to the large volume of messages a day, replies may take up to 72 business hours.  MyChart Policy: MyChart allows for you to see your visit notes, after visit summary, provider recommendations, lab and tests results, make an appointment, request refills, and contact your provider or the office for non-urgent questions or concerns. Providers are seeing patients during normal business hours and do not have built in time to review MyChart messages.  We ask that you allow a minimum of 3 business days for responses to MyConstellation BrandsFor this reason, please do not send urgent requests through My[place]Please call the office at [phone]  New and ongoing conditions may require a visit. We have virtual and in person visit available for your convenience.  Complex MyChart concerns may require a visit. Your provider may request you schedule a virtual or in person visit to ensure we are providing the best care possible. MyChart messages sent after 11:00 AM on Friday will not be received by the provider until Monday morning.    Lab and Test Results: You will receive your  lab and test results on MyChart as soon as they are completed and results have been sent by the lab or testing facility. Due to this service, you will receive your results BEFORE your provider.  I review lab and tests results each morning prior to seeing patients. Some results require collaboration with other providers to ensure you are receiving the most appropriate care. For this reason, we ask that you please allow a minimum of 3-5 business days from the time the ALL results have been received for your provider to receive and review lab and test results and contact you about these.  Most lab and test result comments from the provider will be sent through [place]. Your provider may recommend changes to the plan of care, follow-up visits, repeat testing, ask questions, or request an office visit to discuss these results. You may reply directly to this message or call the office at [phone] to provide information for the provider or set up an appointment. In some instances, you will be called with test results and recommendations. Please let us know if this is preferred and we will make note of this in your chart to provide this for you.    If you have not heard a response to your lab or test results in 5 business days from all results returning to [place], please call the office to let us know. We ask that you please avoid calling prior to this time unless there is an emergent concern. Due to high call volumes, this can delay the resulting process.  After Hours: For all non-emergency after hours needs, please call the office at [phone] and select the option to reach the on-call provider service. On-call services are shared between multiple [place] offices and therefore it will not be possible to speak directly with your provider. On-call providers may provide medical advice and recommendations, but are unable to provide refills for maintenance medications.  For all emergency or urgent medical  needs after normal business hours, we recommend that you seek care at the closest Urgent Care or Emergency Department to ensure appropriate treatment in a timely manner.  [facility] [place] has a 24 hour emergency room located on the ground floor for your convenience.   Urgent Concerns During the Business Day Providers are seeing patients from 8AM to [place] with a busy schedule and are most often not able to respond to non-urgent calls until the end of the day or the next business day. If you should have URGENT concerns during the day, please call and speak to the nurse or schedule a same day appointment so that we can address your concern without delay.   Thank you, again, for choosing me as your health care partner. I appreciate your trust and look forward to learning more about you.   Worthy Keeler, DNP, AGNP-c ___________________________________________________________  Health Maintenance Recommendations Screening Testing Mammogram Every 1 -2 years based on history and risk factors Starting at age 41 Pap Smear Ages 21-39 every 3 years Ages 41-65 every 5 years with  HPV testing More frequent testing may be required based on results and history Colon Cancer Screening Every 1-10 years based on test performed, risk factors, and history Starting at age 26 Bone Density Screening Every 2-10 years based on history Starting at age 67 for women Recommendations for men differ based on medication usage, history, and risk factors AAA Screening One time ultrasound Men 54-88 years old who have every smoked Lung Cancer Screening Low Dose Lung CT every 12 months Age 45-80 years with a 30 pack-year smoking history who still smoke or who have quit within the last 15 years  Screening Labs Routine  Labs: Complete Blood Count (CBC), Complete Metabolic Panel (CMP), Cholesterol (Lipid Panel) Every 6-12 months based on history and medications May be recommended more frequently based on  current conditions or previous results Hemoglobin A1c Lab Every 3-12 months based on history and previous results Starting at age 49 or earlier with diagnosis of diabetes, high cholesterol, BMI >26, and/or risk factors Frequent monitoring for patients with diabetes to ensure blood sugar control Thyroid Panel (TSH w/ T3 & T4) Every 6 months based on history, symptoms, and risk factors May be repeated more often if on medication HIV One time testing for all patients 65 and older May be repeated more frequently for patients with increased risk factors or exposure Hepatitis C One time testing for all patients 27 and older May be repeated more frequently for patients with increased risk factors or exposure Gonorrhea, Chlamydia Every 12 months for all sexually active persons 13-24 years Additional monitoring may be recommended for those who are considered high risk or who have symptoms PSA Men 54-86 years old with risk factors Additional screening may be recommended from age 80-69 based on risk factors, symptoms, and history  Vaccine Recommendations Tetanus Booster All adults every 10 years Flu Vaccine All patients 6 months and older every year COVID Vaccine All patients 12 years and older Initial dosing with booster May recommend additional booster based on age and health history HPV Vaccine 2 doses all patients age 59-26 Dosing may be considered for patients over 26 Shingles Vaccine (Shingrix) 2 doses all adults 32 years and older Pneumonia (Pneumovax 23) All adults 79 years and older May recommend earlier dosing based on health history Pneumonia (Prevnar 23) All adults 43 years and older Dosed 1 year after Pneumovax 23  Additional Screening, Testing, and Vaccinations may be recommended on an individualized basis based on family history, health history, risk factors, and/or exposure.  __________________________________________________________  Diet Recommendations for All  Patients  I recommend that all patients maintain a diet low in saturated fats, carbohydrates, and cholesterol. While this can be challenging at first, it is not impossible and small changes can make big differences.  Things to try: Decreasing the amount of soda, sweet tea, and/or juice to one or less per day and replace with water While water is always the first choice, if you do not like water you may consider adding a water additive without sugar to improve the taste other sugar free drinks Replace potatoes with a brightly colored vegetable at dinner Use healthy oils, such as canola oil or olive oil, instead of butter or hard margarine Limit your bread intake to two pieces or less a day Replace regular pasta with low carb pasta options Bake, broil, or grill foods instead of frying Monitor portion sizes  Eat smaller, more frequent meals throughout the day instead of large meals  An important thing to remember is, if you  love foods that are not great for your health, you don't have to give them up completely. Instead, allow these foods to be a reward when you have done well. Allowing yourself to still have special treats every once in a while is a nice way to tell yourself thank you for working hard to keep yourself healthy.   Also remember that every day is a new day. If you have a bad day and "fall off the wagon", you can still climb right back up and keep moving along on your journey!  We have resources available to help you!  Some websites that may be helpful include: www.http://carter.biz/  Www.VeryWellFit.com _____________________________________________________________  Activity Recommendations for All Patients  I recommend that all adults get at least 20 minutes of moderate physical activity that elevates your heart rate at least 5 days out of the week.  Some examples include: Walking or jogging at a pace that allows you to carry on a conversation Cycling (stationary bike or  outdoors) Water aerobics Yoga Weight lifting Dancing If physical limitations prevent you from putting stress on your joints, exercise in a pool or seated in a chair are excellent options.  Do determine your MAXIMUM heart rate for activity: YOUR AGE - 220 = MAX HeartRate   Remember! Do not push yourself too hard.  Start slowly and build up your pace, speed, weight, time in exercise, etc.  Allow your body to rest between exercise and get good sleep. You will need more water than normal when you are exerting yourself. Do not wait until you are thirsty to drink. Drink with a purpose of getting in at least 8, 8 ounce glasses of water a day plus more depending on how much you exercise and sweat.    If you begin to develop dizziness, chest pain, abdominal pain, jaw pain, shortness of breath, headache, vision changes, lightheadedness, or other concerning symptoms, stop the activity and allow your body to rest. If your symptoms are severe, seek emergency evaluation immediately. If your symptoms are concerning, but not severe, please let us know so that we can recommend further evaluation.

## 2021-03-05 NOTE — Assessment & Plan Note (Deleted)
Longstanding history of depression managed with medication and counseling. Recent increase in depressive symptoms may be related to increased dosage of Keppra.  At this time do not want to make any significant changes to medication without full review of chart and medical history.  Do have some concerns with the use of bupropion in the setting of seizure disorder however this is a long-term medication and without complete review of history changes may be inappropriate. Recommend discussion with neurology about bupropion use and risks with current seizure disorder.  Will review medical history to determine if this has been addressed in the past.

## 2021-03-05 NOTE — Progress Notes (Signed)
Orma Render, DNP, AGNP-c [facility] [address]   [place], [place] [address] [phone]  New patient visit-visit performed virtually due to recent COVID-19 diagnosis by patient. Patient at home during visit.  Provider in office. No other persons present for visit. Patient consented to virtual visit today. Visit conducted over telephone.   Patient: Claudia Elliott   DOB: Sep 30, 1965   56 y.o. Female  MRN: [id number] Visit Date: 03/05/2021  Patient Care Team: Cleatus Gabriel, Coralee Pesa, NP as PCP - General (Nurse Practitioner) Megan Salon, MD as Consulting Physician (Obstetrics and Gynecology)  Today's healthcare provider: Orma Render, NP   Chief Complaint  Patient presents with   Establish Care    Former PCP - [facility]   Covid Positive    Patient would like to discuss paxlovid. She tested positive for covid 19 last night at home. Patient has cough, nasal congestion, and shortness of breath   Obesity    Patient states in the last 12 months she has gained 30 pounds and has had no significant changes to her lifestyle or eating habits.    Subjective    HPI HPI     Establish Care    Additional comments: Former PCP - [facility]        Covid Positive    Additional comments: Patient would like to discuss paxlovid. She tested positive for covid 19 last night at home. Patient has cough, nasal congestion, and shortness of breath        Obesity    Additional comments: Patient states in the last 12 months she has gained 30 pounds and has had no significant changes to her lifestyle or eating habits.       Last edited by Bobby Rumpf, [place] on 03/05/2021  1:59 PM.     Claudia Elliott is a 56 y.o. female who presents today as a new patient to establish care.    Tested positive for COVID yesterday at home after noting onset of symptoms earlier in the day. She  endorses symptoms of body aches, teeth hurt, headache, rhinorrhea, congestion, sneezing, cough, white phlegm, fever.  Current temperature is 99.4. She has not been taking any medication for this but has been working on resting and making sure she maintains hydration. She denies shortness of breath, feeling of impending doom, wheezing, dizziness, or weakness.  She has a history of hypertension for which she was recently started on amlodipine 5 mg daily. She reports that she has been tolerating the medication well however is not been able to check her blood pressures at home as her blood pressure cuff is no longer working. She did run out of her blood pressure medication approximately 4 days ago and has been able to get to the pharmacy to refill this.  She was hoping to do this at today's visit however it had been turned virtual due to new diagnosis of COVID.    She has history of seizure disorder for which she is taking 1700 mg of Keppra daily. She had a recent increase in her dosage due to increased seizure activity at night. She reports she is tolerating this medication well however is concerned that it may be contributing some of her weight gain.  She has concerns with approximately 30 pound weight gain over the last 12 months.  She endorses no significant changes in her life except the addition  of 750 mg of Keppra.  She tells me that she is very active at work and watches what she eats closely. She is very concerned with her increased weight gain for unknown reasons.   Past Medical History:  Diagnosis Date   Abnormal Pap smear    repeat pap   Allergy    Depression    Fibroid    Heart murmur    Migraine    Panic attacks    Primary hypertension 03/05/2021   Seizures ([facility])    x 3 , last seizure 06/23/14   Past Surgical History:  Procedure Laterality Date   APPENDECTOMY     BREAST BIOPSY Right 2013   BREAST CYST ASPIRATION Right 12/2012   KNEE SURGERY Right 07/19/2017   Family Status   Relation Name Status   MGM  Deceased   Mother  Alive   Family History  Problem Relation Age of Onset   Diabetes Maternal Grandmother    Hypertension Maternal Grandmother    Hypertension Mother    Social History   Socioeconomic History   Marital status: Divorced    Spouse name: Not on file   Number of children: 1   Years of education: MA   Highest education level: Not on file  Occupational History   Occupation: [employer]    Employer: [employer]  Tobacco Use   Smoking status: Former    Types: Cigarettes   Smokeless tobacco: Never   Tobacco comments:    Quit over 20 years ago. Only smoked for less than a year  Vaping Use   Vaping Use: Never used  Substance and Sexual Activity   Alcohol use: Yes    Alcohol/week: 2.0 standard drinks    Types: 2 Glasses of wine per week   Drug use: No   Sexual activity: Not Currently    Birth control/protection: Post-menopausal  Other Topics Concern   Not on file  Social History Narrative   Patient lives at home with son.   Patient is right handed.   Patient has a masters degree   Caffeine Use: 1 cup of tea daily   Social Determinants of Health   Financial Resource Strain: Not on file  Food Insecurity: Not on file  Transportation Needs: Not on file  Physical Activity: Not on file  Stress: Not on file  Social Connections: Not on file   Outpatient Medications Prior to Visit  Medication Sig   amLODipine (NORVASC) 5 MG tablet Take 1 tablet (5 mg total) by mouth daily.   buPROPion (WELLBUTRIN XL) 300 MG 24 hr tablet Take 300 mg by mouth daily.   busPIRone (BUSPAR) 30 MG tablet Take 60 mg by mouth daily.   DULoxetine (CYMBALTA) 60 MG capsule Take 2 capsules by mouth daily.   hydroquinone 4 % cream Apply small pea-sized amount nightly to affected dark spots.   NONFORMULARY OR COMPOUNDED ITEM Vitamin E vaginal cream 200u/ml.  One ml two to three times weekly.  Disp: 3 month supply.   Sulfacetamide Sodium,  Acne, 10 % LOTN Apply to face each am.   [DISCONTINUED] hydrOXYzine (ATARAX/VISTARIL) 10 MG tablet Take 2 tablets by mouth 3 (three) times daily as needed.   [DISCONTINUED] levETIRAcetam (KEPPRA) 1000 MG tablet Take 1 tablet by mouth daily.   [DISCONTINUED] levETIRAcetam (KEPPRA) 750 MG tablet Take 1 tablet by mouth daily.   [DISCONTINUED] LORazepam (ATIVAN) 0.5 MG tablet Take 1 tablet by mouth daily as needed for anxiety.   [DISCONTINUED] traZODone (DESYREL) 50  MG tablet Take 1 tablet (50 mg total) by mouth at bedtime.   tretinoin (RETIN-A) 0.025 % cream daily as needed.   triamcinolone cream (KENALOG) 0.1 % daily as needed.   [DISCONTINUED] busPIRone (BUSPAR) 15 MG tablet Take 1 tablet by mouth daily. (Patient not taking: Reported on 03/05/2021)   [DISCONTINUED] zonisamide (ZONEGRAN) 100 MG capsule Take 3 capsules by mouth at bedtime. (Patient not taking: Reported on 03/05/2021)   No facility-administered medications prior to visit.   Allergies  Allergen Reactions   Dilaudid [Hydromorphone] Hives    Out of it, takes a long time to come back   Percocet [Oxycodone-Acetaminophen] Itching    Immunization History  Administered Date(s) Administered   PFIZER(Purple Top)SARS-COV-2 Vaccination 05/22/2019, 06/19/2019   Td 09/22/2020    Health Maintenance  Topic Date Due   COVID-19 Vaccine (3 - Pfizer risk series) 07/17/2019   Zoster Vaccines- Shingrix (1 of 2) 04/26/2021 (Originally 06/23/1982)   INFLUENZA VACCINE  05/08/2021 (Originally 09/08/2020)   MAMMOGRAM  12/05/2022   PAP SMEAR-Modifier  01/27/2024   COLONOSCOPY (Pts 45-40yr Insurance coverage will need to be confirmed)  04/28/2030   TETANUS/TDAP  09/23/2030   Hepatitis C Screening  Completed   HIV Screening  Completed   HPV VACCINES  Aged Out    Patient Care Team: Caston Coopersmith, SCoralee Pesa NP as PCP - General (Nurse Practitioner) MMegan Salon MD as Consulting Physician (Obstetrics and Gynecology)  Review of Systems All review of  systems negative except what is listed in the HPI   Objective    Ht 5' 7"  (1.702 m)    Wt 177 lb (80.3 kg)    LMP 06/13/2014 (Approximate)    BMI 27.72 kg/m  Physical Exam Patient audibly congested with dry cough present Speaking in complete sentences. No respiratory distress present.    No results found for any visits on 03/05/21.  Assessment & Plan      Problem List Items Addressed This Visit     Epilepsy, generalized, convulsive ([place]    History of epilepsy followed by neurology. She is currently taking 1750 mg of Keppra daily.  This is an increase from 1000 mg of the last few months due to increased nighttime seizure symptoms. At this time she is having no concerning symptoms. She does have concerns with increased weight gain related to increased dose of Keppra.  Review of side effects of Keppra with patient today and determined that weight gain is not a significant finding with this medication. Recommend continuation of Keppra at current dose for seizure management and seek emergency care notify neurology or this office immediately if she begins to have new or worsening symptoms.      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      Relevant Medications   levETIRAcetam (KEPPRA) 1000 MG tablet   levETIRAcetam (KEPPRA) 750 MG tablet   Episode of recurrent major depressive disorder ([place]    Longstanding history of depression managed with medication and counseling. Recent increase in depressive symptoms may be related to increased dosage of Keppra.  At this time do not want to make any significant changes to medication without full review of chart and medical history.  Do have some concerns with the use of bupropion in the setting of seizure disorder however this is a long-term medication and without complete review of history  changes may be inappropriate. Recommend discussion with neurology about bupropion use and risks with current seizure disorder.  Will review medical history to determine if this has been addressed in the past.      Relevant Medications   busPIRone (BUSPAR) 30 MG tablet   LORazepam (ATIVAN) 0.5 MG tablet   traZODone (DESYREL) 50 MG tablet   Primary hypertension    New diagnosis of hypertension with recent start of amlodipine 5 mg with OB/GYN. She is a new patient to establish care in this office today.  Unfortunately unable to check blood pressure as her blood pressure cuff at home start working and she is quarantined at this time with COVID-19. Will send prescription to patient for home blood pressure cuff so that she can monitor accurately at home. She is not having any alarm symptoms present today. We will plan to follow-up with the patient in the next few months for further evaluation.  Recent labs reviewed today with no concerning findings.       Relevant Medications   nirmatrelvir/ritonavir EUA (PAXLOVID) 20 x 150 MG & 10 x 100MG TABS   Blood Pressure Monitoring (BLOOD PRESSURE KIT) KIT   amLODipine (NORVASC) 5 MG tablet   COVID-19 - Primary    Positive COVID-19 at home test on 03/05/2021 with symptoms. Patient is considered high risk given medical history. Recommendations for Paxil Oved for antiviral properties in addition to over-the-counter treatments which may be helpful including increased hydration, Mucinex, alternating Tylenol and ibuprofen, and rest. Discussion with patient on alarm symptoms that would warrant immediate evaluation. She expresses understanding today. Unfortunately she is unable to get out of the house to pick up the prescriptions therefore we will work to see if we can get the medications delivered or family member could pick them up for her. She will follow-up if symptoms worsen or fail to improve.      Relevant Medications   nirmatrelvir/ritonavir EUA  (PAXLOVID) 20 x 150 MG & 10 x 100MG TABS   guaiFENesin (MUCINEX) 600 MG 12 hr tablet   acetaminophen (TYLENOL) 500 MG tablet   Generalized anxiety disorder    Longstanding history of generalized anxiety disorder. Currently on multi medication regimen with therapy. Recommendation to continue current therapy and treatment plan. Will review medical history and determine if changes are necessary for improved control. Patient knows to contact the office if she has any worsening symptoms or changes to her mental health status. No alarm symptoms present today.      Relevant Medications   busPIRone (BUSPAR) 30 MG tablet   LORazepam (ATIVAN) 0.5 MG tablet   traZODone (DESYREL) 50 MG tablet   Weight gain, abnormal    Approximate 30 pound weight gain over the past 12 months despite increased activity and dietary monitoring. Reviewed labs from recent OB/GYN visit which were overall normal. Etiology of weight gain is unknown.  Unlikely that Keppra has contributed to her increase in weight gain.  Possible contributing factor or hormonal changes associated with menopause. Discussed options of medication management with patient including dietary and daily exercise regimen.  She would like to try phentermine to see if this is helpful.  Warned patient against risk of increased anxiety associated with the medication.  She is aware to stop the medication immediately if she notices that her anxiety is increasing and she will contact the office. Recommend follow-up in 3 months for mood and weight checkup.      Relevant Medications   phentermine 37.5 MG capsule  Other Visit Diagnoses     Encounter for medical examination to establish care            Return in about 3 months (around 06/03/2021) for Weight checkI w.    Time: 40 minutes, >50% spent counseling, care coordination, chart review, and documentation.    Sharaine Delange, Coralee Pesa, NP, DNP, AGNP-C [facility] at [place]

## 2021-03-05 NOTE — Assessment & Plan Note (Signed)
History of epilepsy followed by neurology. She is currently taking 1750 mg of Keppra daily.  This is an increase from 1000 mg of the last few months due to increased nighttime seizure symptoms. At this time she is having no concerning symptoms. She does have concerns with increased weight gain related to increased dose of Keppra.  Review of side effects of Keppra with patient today and determined that weight gain is not a significant finding with this medication. Recommend continuation of Keppra at current dose for seizure management and seek emergency care notify neurology or this office immediately if she begins to have new or worsening symptoms.

## 2021-03-05 NOTE — Assessment & Plan Note (Addendum)
>>  ASSESSMENT AND PLAN FOR FOCAL EPILEPSY ([facility]) WRITTEN ON 03/05/2021  7:20 PM BY Ladell Bey E, NP  See epilepsy, generalized, convulsive for full assessment and plan.  >>ASSESSMENT AND PLAN FOR EPILEPSY, GENERALIZED, CONVULSIVE ([facility]CC) WRITTEN ON 03/05/2021  7:19 PM BY Tenishia Ekman E, NP  History of epilepsy followed by neurology. She is currently taking 1750 mg of Keppra daily.  This is an increase from 1000 mg of the last few months due to increased nighttime seizure symptoms. At this time she is having no concerning symptoms. She does have concerns with increased weight gain related to increased dose of Keppra.  Review of side effects of Keppra with patient today and determined that weight gain is not a significant finding with this medication. Recommend continuation of Keppra at current dose for seizure management and seek emergency care notify neurology or this office immediately if she begins to have new or worsening symptoms.

## 2021-03-05 NOTE — Assessment & Plan Note (Signed)
Approximate 30 pound weight gain over the past 12 months despite increased activity and dietary monitoring. Reviewed labs from recent OB/GYN visit which were overall normal. Etiology of weight gain is unknown.  Unlikely that Keppra has contributed to her increase in weight gain.  Possible contributing factor or hormonal changes associated with menopause. Discussed options of medication management with patient including dietary and daily exercise regimen.  She would like to try phentermine to see if this is helpful.  Warned patient against risk of increased anxiety associated with the medication.  She is aware to stop the medication immediately if she notices that her anxiety is increasing and she will contact the office. Recommend follow-up in 3 months for mood and weight checkup.

## 2021-03-05 NOTE — Assessment & Plan Note (Signed)
>>  ASSESSMENT AND PLAN FOR GENERALIZED ANXIETY DISORDER WRITTEN ON 03/05/2021  7:25 PM BY Jaymison Luber E, NP  Longstanding history of generalized anxiety disorder. Currently on multi medication regimen with therapy. Recommendation to continue current therapy and treatment plan. Will review medical history and determine if changes are necessary for improved control. Patient knows to contact the office if she has any worsening symptoms or changes to her mental health status. No alarm symptoms present today.

## 2021-03-06 ENCOUNTER — Telehealth (HOSPITAL_BASED_OUTPATIENT_CLINIC_OR_DEPARTMENT_OTHER): Payer: Self-pay | Admitting: Nurse Practitioner

## 2021-03-06 ENCOUNTER — Other Ambulatory Visit (HOSPITAL_BASED_OUTPATIENT_CLINIC_OR_DEPARTMENT_OTHER): Payer: Self-pay

## 2021-03-06 ENCOUNTER — Emergency Department (HOSPITAL_BASED_OUTPATIENT_CLINIC_OR_DEPARTMENT_OTHER)
Admission: EM | Admit: 2021-03-06 | Discharge: 2021-03-06 | Disposition: A | Discharge status: Home / Self Care | Payer: Medicare (Managed Care) | Attending: Emergency Medicine | Admitting: Emergency Medicine

## 2021-03-06 ENCOUNTER — Encounter (HOSPITAL_BASED_OUTPATIENT_CLINIC_OR_DEPARTMENT_OTHER): Payer: Self-pay

## 2021-03-06 ENCOUNTER — Other Ambulatory Visit: Payer: Self-pay

## 2021-03-06 ENCOUNTER — Emergency Department (HOSPITAL_BASED_OUTPATIENT_CLINIC_OR_DEPARTMENT_OTHER): Payer: Medicare (Managed Care)

## 2021-03-06 DIAGNOSIS — U071 COVID-19: Secondary | ICD-10-CM | POA: Diagnosis not present

## 2021-03-06 DIAGNOSIS — I10 Essential (primary) hypertension: Secondary | ICD-10-CM | POA: Insufficient documentation

## 2021-03-06 DIAGNOSIS — R519 Headache, unspecified: Secondary | ICD-10-CM | POA: Diagnosis present

## 2021-03-06 DIAGNOSIS — Z79899 Other long term (current) drug therapy: Secondary | ICD-10-CM | POA: Diagnosis not present

## 2021-03-06 MED ORDER — ONDANSETRON 4 MG PO TBDP
4.0000 mg | ORAL_TABLET | Freq: Three times a day (TID) | ORAL | 0 refills | Status: DC | PRN
  Filled 2021-03-06: qty 20, 7d supply, fill #0

## 2021-03-06 MED ORDER — IBUPROFEN 800 MG PO TABS
800.0000 mg | ORAL_TABLET | Freq: Once | ORAL | Status: AC
  Administered 2021-03-06: 800 mg via ORAL
  Filled 2021-03-06: qty 1

## 2021-03-06 MED ORDER — BENZONATATE 100 MG PO CAPS
100.0000 mg | ORAL_CAPSULE | Freq: Three times a day (TID) | ORAL | 0 refills | Status: DC
  Filled 2021-03-06: qty 21, 7d supply, fill #0

## 2021-03-06 NOTE — ED Triage Notes (Signed)
Pt came in c/o of SHOB, body aches, teeth hurt, headache, rhinorrhea, congestion, sneezing, cough, phlegm, d/t Covid. Pt denies N/V/D. Pt is coughing up mucus. Pt started her paxlovid yesterday.

## 2021-03-06 NOTE — Telephone Encounter (Signed)
Spoke with patient who explained she is feeling worse.  Her temperature has spiked and she can barely talk Patient states she took two doses of paxlovid and is still not feeling well Advised patient to report to the ED if she feels symptoms are worsening Patient verbalized understanding and states she would report to the [facility] ED

## 2021-03-06 NOTE — Telephone Encounter (Signed)
PT called and stated she is feeling worse. Pt fever has gone up and symptoms have gotten worse. Pt was told to call the office if she did not get to feeling better. Pt had NP appt yesterday. Please advise.

## 2021-03-06 NOTE — ED Provider Notes (Signed)
[facility] DEPT Provider Note   CSN: [id number] Arrival date & time: 03/06/21  1241     History  No chief complaint on file.   Claudia Elliott is a 56 y.o. female with past medical history of epilepsy (on Keppra), major depressive disorder, hypertension.  Per chart review patient had positive home COVID-19 test on 1/25, symptoms started the same day.  Patient had telemedicine appointment on 1/26 where she was started on Paxlovid.    Patient presents emergency department with a chief complaint of worsening COVID-19 symptoms.  Patient states that she has been having headache, generalized body aches, rhinorrhea, nasal congestion, productive cough, fevers, chills, decreased appetite, and shortness of breath.  Patient reports that she took morphine last night and 1000 mg of Tylenol was 3 hours prior to arrival in the emergency department.  Patient started Paxil with medication.  Patient reports that cough is producing white to yellow mucus.  Patient notes that she has seen green mucus when blowing her nose.  Patient denies any nausea, vomiting, abdominal pain, diarrhea, constipation, hemoptysis, leg swelling or tenderness, chest pain, syncope.  HPI     Home Medications Prior to Admission medications   Medication Sig Start Date End Date Taking? Authorizing Provider  acetaminophen (TYLENOL) 500 MG tablet Take 2 tablets (1,000 mg total) by mouth every 8 (eight) hours as needed. Alternate with ibuprofen. 03/05/21   Orma Render, NP  amLODipine (NORVASC) 5 MG tablet Take 1 tablet (5 mg total) by mouth daily. 01/26/21   Megan Salon, MD  amLODipine (NORVASC) 5 MG tablet Take 1 tablet (5 mg total) by mouth daily. 03/05/21   Orma Render, NP  Blood Pressure Monitoring (BLOOD PRESSURE KIT) KIT For monitoring BP. ICD-10 I10 03/05/21   Orma Render, NP  buPROPion (WELLBUTRIN XL) 300 MG 24 hr tablet Take 300 mg by mouth daily. 12/07/18   [provider]  busPIRone  (BUSPAR) 30 MG tablet Take 60 mg by mouth daily.    [provider]  DULoxetine (CYMBALTA) 60 MG capsule Take 2 capsules by mouth daily. 10/15/17   [provider]  guaiFENesin (MUCINEX) 600 MG 12 hr tablet Take 2 tablets (1,200 mg total) by mouth 2 (two) times daily. 03/05/21   Orma Render, NP  hydroquinone 4 % cream Apply small pea-sized amount nightly to affected dark spots. 10/12/18   [provider]  levETIRAcetam (KEPPRA) 1000 MG tablet Take 1 tablet (1,000 mg total) by mouth daily. 03/05/21   Orma Render, NP  levETIRAcetam (KEPPRA) 750 MG tablet Take 1 tablet (750 mg total) by mouth daily. 03/05/21   Orma Render, NP  LORazepam (ATIVAN) 0.5 MG tablet Take 1 tablet (0.5 mg total) by mouth daily as needed for anxiety. 03/05/21   Orma Render, NP  nirmatrelvir/ritonavir EUA (PAXLOVID) 20 x 150 MG & 10 x 100MG TABS Take 3 tablets by mouth 2 (two) times daily for 5 days. 03/05/21 03/10/21  Orma Render, NP  NONFORMULARY OR COMPOUNDED ITEM Vitamin E vaginal cream 200u/ml.  One ml two to three times weekly.  Disp: 3 month supply. 01/26/21   Megan Salon, MD  phentermine 37.5 MG capsule One capsule by mouth qAM 03/05/21   Early, Coralee Pesa, NP  Sulfacetamide Sodium, Acne, 10 % LOTN Apply to face each am. 10/24/18   [provider]  traZODone (DESYREL) 50 MG tablet Take 1 tablet (50 mg total) by mouth at bedtime. 03/05/21  Orma Render, NP  tretinoin (RETIN-A) 0.025 % cream daily as needed. 07/25/17   [provider]  triamcinolone cream (KENALOG) 0.1 % daily as needed. 07/25/17   [provider]      Allergies    Dilaudid [hydromorphone] and Percocet [oxycodone-acetaminophen]    Review of Systems   Review of Systems  Constitutional:  Positive for chills, fatigue and fever.  HENT:  Positive for congestion, rhinorrhea and sore throat.   Eyes:  Negative for visual disturbance.  Respiratory:  Positive for cough and shortness of breath.    Cardiovascular:  Negative for chest pain, palpitations and leg swelling.  Gastrointestinal:  Negative for abdominal pain, nausea and vomiting.  Genitourinary:  Negative for difficulty urinating and dysuria.  Musculoskeletal:  Positive for neck pain. Negative for back pain.  Skin:  Negative for color change and rash.  Neurological:  Positive for light-headedness and headaches. Negative for dizziness, tremors, seizures, syncope, facial asymmetry, speech difficulty, weakness and numbness.  Psychiatric/Behavioral:  Negative for confusion.    Physical Exam Updated Vital Signs BP (!) 127/91    Pulse (!) 120    Temp 97.9 F (36.6 C)    Resp 16    LMP 06/13/2014 (Approximate)    SpO2 93%  Physical Exam Vitals and nursing note reviewed.  Constitutional:      General: She is not in acute distress.    Appearance: She is ill-appearing. She is not toxic-appearing or diaphoretic.  HENT:     Head: Normocephalic.     Jaw: No trismus or pain on movement.     Mouth/Throat:     Lips: Pink. No lesions.     Mouth: Mucous membranes are moist.     Tongue: No lesions. Tongue does not deviate from midline.     Palate: No mass and lesions.     Pharynx: Oropharynx is clear. Uvula midline. Posterior oropharyngeal erythema present. No pharyngeal swelling, oropharyngeal exudate or uvula swelling.     Tonsils: No tonsillar exudate or tonsillar abscesses. 1+ on the right. 1+ on the left.     Comments: Erythema and cobblestoning noted to posterior oropharynx.  Handles oral secretions without difficulty.  No hot potato voice. Eyes:     General: No scleral icterus.       Right eye: No discharge.        Left eye: No discharge.  Neck:     Comments: No swelling to submandibular space Cardiovascular:     Rate and Rhythm: Normal rate.  Pulmonary:     Effort: Pulmonary effort is normal. No tachypnea, bradypnea or respiratory distress.     Breath sounds: Normal breath sounds. No stridor.     Comments: Speaks in full  sentences without difficulty Abdominal:     General: Bowel sounds are normal. There is no distension. There are no signs of injury.     Palpations: Abdomen is soft. There is no mass.     Tenderness: There is no abdominal tenderness. There is no guarding or rebound.  Musculoskeletal:     Cervical back: Normal range of motion and neck supple. No edema, erythema, signs of trauma, rigidity, torticollis or crepitus. No pain with movement, spinous process tenderness or muscular tenderness. Normal range of motion.     Right lower leg: Normal.     Left lower leg: Normal.  Skin:    General: Skin is warm and dry.  Neurological:     General: No focal deficit present.     Mental  Status: She is alert.     GCS: GCS eye subscore is 4. GCS verbal subscore is 5. GCS motor subscore is 6.     Cranial Nerves: No dysarthria or facial asymmetry.     Comments: No focal neurological deficits.  Psychiatric:        Behavior: Behavior is cooperative.    ED Results / Procedures / Treatments   Labs (all labs ordered are listed, but only abnormal results are displayed) Labs Reviewed - No data to display  EKG None  Radiology DG Chest Portable 1 View  Result Date: 03/06/2021 CLINICAL DATA:  Cough EXAM: PORTABLE CHEST 1 VIEW COMPARISON:  09/22/2020 FINDINGS: Mild interstitial changes at the lung bases. No pleural effusion or pneumothorax. Normal heart size. IMPRESSION: Mild interstitial changes at the lung bases could reflect atelectasis or atypical/viral pneumonia in the appropriate setting. Electronically Signed   By: Macy Mis M.D.   On: 03/06/2021 14:03    Procedures Procedures    Medications Ordered in ED Medications - No data to display  ED Course/ Medical Decision Making/ A&P                           Medical Decision Making Amount and/or Complexity of Data Reviewed Radiology: ordered.  Risk Prescription drug management.   Alert 56 year old female in no acute distress, nontoxic  appearing.  Patient does appear ill.  Presents to the emergency department with worsening of COVID-19 symptoms.  Information was obtained from patient.  Medical records were reviewed.  Per chart review patient had positive home COVID-19 test on 1/25, symptoms started the same day.  Patient had telemedicine appointment on 1/26 where she was started on Paxlovid.  Patient has medical history of epilepsy and hypertension which complicate her care.  Patient noted to be febrile and tachycardic upon arrival.  Suspect that patient's tachycardia secondary to febrile illness.  We will give patient ibuprofen and reassess.  On reassessment patient is afebrile and improvement in heart rate.  SPO2 with ambulation was performed and patient found to have oxygen saturation 94% on room air with ambulation.  Chest x-ray was obtained and evaluated myself.  X-ray imaging shows Mild interstitial changes at the lung bases could reflect atelectasis or atypical/viral pneumonia.  Suspect that changes seen on patient's chest x-ray are secondary to her recent COVID-19 infection.  Patient hemodynamically stable at this time.  Will prescribe patient with Tessalon and Zofran.  Discussed symptomatic treatment with Tylenol and ibuprofen.  Patient advised to follow-up with primary care provider if symptoms do not improve.  Discussed strict return precautions and isolation.  Discussed results, findings, treatment and follow up. Patient advised of return precautions. Patient verbalized understanding and agreed with plan.  NIAMH RADA was evaluated in Emergency Department on 03/06/2021 for the symptoms described in the history of present illness. She was evaluated in the context of the global COVID-19 pandemic, which necessitated consideration that the patient might be at risk for infection with the SARS-CoV-2 virus that causes COVID-19. Institutional protocols and algorithms that pertain to the evaluation of patients at risk for  COVID-19 are in a state of rapid change based on information released by regulatory bodies including the CDC and federal and state organizations. These policies and algorithms were followed during the patient's care in the ED.         Final Clinical Impression(s) / ED Diagnoses Final diagnoses:  [id number]    Rx / DC Orders ED  Discharge Orders          Ordered    benzonatate (TESSALON) 100 MG capsule  Every 8 hours        03/06/21 1427    ondansetron (ZOFRAN-ODT) 4 MG disintegrating tablet  Every 8 hours PRN        03/06/21 1427              Dyann Ruddle 03/06/21 1433    Luna Fuse, MD 03/11/21 [phone]

## 2021-03-06 NOTE — Discharge Instructions (Signed)
You came to the emergency department today with reports of Covid-19 like symptoms.   You tested positive for COVID-19. Please isolate at home for at least 5 days after the day your symptoms initially began, and THEN at least 24 hours after you are fever-free without the help of medications (Tylenol/acetaminophen and Advil/ibuprofen/Motrin) AND your symptoms are improving.  You can alternate Tylenol/acetaminophen and Advil/ibuprofen/Motrin every 4 hours for sore throat, body aches, headache or fever.  Drink plenty of water.  Use saline nasal spray for congestion. You can take Tessalon every 8 hours as needed for cough. You can take Zofran every 8 hours as needed for nausea and vomiting. Wash your hands frequently. Please rest as needed with frequent repositioning and ambulation as tolerated.    If you use a CPAP or BiPAP device for management of obstructive sleep apnea may continue to use it however use it when isolated from other individuals to avoid spread of COVID-19.   If you use a nebulizer administer medication such as albuterol you may continue to use it however only one isolated from other individuals to avoid the spread of COVID-19.  If your symptoms do not improve please follow-up with your primary care provider or urgent care.  Return to the ER for significant shortness of breath, uncontrollable vomiting, severe chest pain, inability to tolerate fluids, changes in mental status such as confusion or other concerning symptoms.

## 2021-03-11 ENCOUNTER — Telehealth (HOSPITAL_BASED_OUTPATIENT_CLINIC_OR_DEPARTMENT_OTHER): Payer: Self-pay | Admitting: Nurse Practitioner

## 2021-03-11 ENCOUNTER — Other Ambulatory Visit (HOSPITAL_BASED_OUTPATIENT_CLINIC_OR_DEPARTMENT_OTHER): Payer: Self-pay

## 2021-03-11 NOTE — Telephone Encounter (Signed)
Pt called stated she is needing more of the  benzonatate (TESSALON) 100 MG capsule [[id number]] Pt received these while at the ED and is needing more to get threw the night until her appt with provider on 2/8. Pt is aware that because we did not proscribe them to her we might not be able to refill them. She is aware that the provide ris out of the office until next week. Pt would like a call form the nurse regarding this refill.  Please advise.

## 2021-03-12 ENCOUNTER — Other Ambulatory Visit (HOSPITAL_BASED_OUTPATIENT_CLINIC_OR_DEPARTMENT_OTHER): Payer: Self-pay | Admitting: Family Medicine

## 2021-03-12 MED ORDER — BENZONATATE 100 MG PO CAPS: 100.0000 mg | ORAL_CAPSULE | Freq: Three times a day (TID) | ORAL | 0 refills | Status: DC

## 2021-03-12 NOTE — Telephone Encounter (Signed)
Called patient and spoke with her and told her medication had been called in.

## 2021-03-17 NOTE — Progress Notes (Signed)
Acute Office Visit  Subjective:    Patient ID: Claudia Elliott, female    DOB: March 29, 1965, 56 y.o.   MRN: [id number]  Chief Complaint  Patient presents with   CPE    Patient presents today for follow up from ED. She states that she is experiencing bad cough that makes her throat hurt. She still has congestion. Patient stated she would like a cough medicine with codeine. No refills needed today.    HPI Patient is in today for follow-up from ED visit for COVID-19 symptoms on 03/06/2021. She was seen virtually on 03/05/2021 for positive COVID-19 test 03/04/2021. At that time she was prescribed Paxlovid and provided with OTC treatment options to help with symptoms. Chart review reveals she began to feel worse on 03/06/2021 and presented to the ED for evaluation.  CXR show possible viral pna with stable vitals. She was provided with ibuprofen for fever and fever along with tachycardia resolved. She was discharged home to continue treatment.   At this time she tells me she is feeling much better. She denies fevers, chills, N/V/D, shortness of breath, CP. She is still experiencing residual cough, weakness, fatigue but feels this is improving.  The worst symptom right now is the ongoing cough. She tells me that this is strong and keeps her up at night. She has tried OTC treatments without success.   Outpatient Medications Prior to Visit  Medication Sig Dispense Refill   acetaminophen (TYLENOL) 500 MG tablet Take 2 tablets (1,000 mg total) by mouth every 8 (eight) hours as needed. Alternate with ibuprofen. 60 tablet 1   amLODipine (NORVASC) 5 MG tablet Take 1 tablet (5 mg total) by mouth daily. 30 tablet 1   amLODipine (NORVASC) 5 MG tablet Take 1 tablet (5 mg total) by mouth daily. 90 tablet 1   benzonatate (TESSALON) 100 MG capsule Take 1 capsule (100 mg total) by mouth every 8 (eight) hours. 30 capsule 0   Blood Pressure Monitoring (BLOOD PRESSURE KIT) KIT For monitoring BP. ICD-10 I10 1 kit 0    buPROPion (WELLBUTRIN XL) 300 MG 24 hr tablet Take 300 mg by mouth daily.     busPIRone (BUSPAR) 30 MG tablet Take 60 mg by mouth daily.     DULoxetine (CYMBALTA) 60 MG capsule Take 2 capsules by mouth daily.  3   guaiFENesin (MUCINEX) 600 MG 12 hr tablet Take 2 tablets (1,200 mg total) by mouth 2 (two) times daily. 30 tablet 1   hydroquinone 4 % cream Apply small pea-sized amount nightly to affected dark spots.     levETIRAcetam (KEPPRA) 1000 MG tablet Take 1 tablet (1,000 mg total) by mouth daily. 90 each 3   levETIRAcetam (KEPPRA) 750 MG tablet Take 1 tablet (750 mg total) by mouth daily. 90 each 3   LORazepam (ATIVAN) 0.5 MG tablet Take 1 tablet (0.5 mg total) by mouth daily as needed for anxiety. 30 tablet 2   NONFORMULARY OR COMPOUNDED ITEM Vitamin E vaginal cream 200u/ml.  One ml two to three times weekly.  Disp: 3 month supply. 36 each 3   ondansetron (ZOFRAN-ODT) 4 MG disintegrating tablet Take 1 tablet (4 mg total) by mouth every 8 (eight) hours as needed for nausea or vomiting. 20 tablet 0   phentermine 37.5 MG capsule One capsule by mouth qAM 30 capsule 2   Sulfacetamide Sodium, Acne, 10 % LOTN Apply to face each am.     traZODone (DESYREL) 50 MG tablet Take 1 tablet (50 mg total)  by mouth at bedtime. 30 tablet 11   tretinoin (RETIN-A) 0.025 % cream daily as needed.     triamcinolone cream (KENALOG) 0.1 % daily as needed.  0   No facility-administered medications prior to visit.    Allergies  Allergen Reactions   Dilaudid [Hydromorphone] Hives    Out of it, takes a long time to come back   Percocet [Oxycodone-Acetaminophen] Itching    Review of Systems All review of systems negative except what is listed in the HPI     Objective:    Physical Exam Vitals and nursing note reviewed.  Constitutional:      Appearance: Normal appearance.  HENT:     Head: Normocephalic.     Nose: Nose normal.     Mouth/Throat:     Mouth: Mucous membranes are moist.     Pharynx:  Oropharynx is clear.  Eyes:     Extraocular Movements: Extraocular movements intact.     Conjunctiva/sclera: Conjunctivae normal.     Pupils: Pupils are equal, round, and reactive to light.  Neck:     Vascular: No carotid bruit.  Cardiovascular:     Rate and Rhythm: Normal rate and regular rhythm.     Pulses: Normal pulses.     Heart sounds: Normal heart sounds.  Pulmonary:     Effort: Pulmonary effort is normal. No respiratory distress.     Breath sounds: Normal breath sounds. No wheezing or rhonchi.  Abdominal:     General: Abdomen is flat. Bowel sounds are normal.     Palpations: Abdomen is soft.  Musculoskeletal:        General: Normal range of motion.     Cervical back: Normal range of motion.     Right lower leg: No edema.     Left lower leg: No edema.  Lymphadenopathy:     Cervical: Cervical adenopathy present.  Skin:    General: Skin is warm and dry.     Capillary Refill: Capillary refill takes less than 2 seconds.  Neurological:     General: No focal deficit present.     Mental Status: She is alert and oriented to person, place, and time.     Motor: Weakness present.  Psychiatric:        Mood and Affect: Mood normal.        Behavior: Behavior normal.        Thought Content: Thought content normal.        Judgment: Judgment normal.    BP 122/82    Pulse 96    Ht 5' 6"  (1.676 m)    Wt 172 lb 11.2 oz (78.3 kg)    LMP 06/13/2014 (Approximate)    SpO2 97%    BMI 27.87 kg/m  Wt Readings from Last 3 Encounters:  03/18/21 172 lb 11.2 oz (78.3 kg)  03/06/21 177 lb (80.3 kg)  03/05/21 177 lb (80.3 kg)    Health Maintenance Due  Topic Date Due   COVID-19 Vaccine (3 - Pfizer risk series) 07/17/2019    There are no preventive care reminders to display for this patient.   Lab Results  Component Value Date   TSH 3.600 01/26/2021   Lab Results  Component Value Date   WBC 4.0 09/05/2017   HGB 15.6 09/05/2017   HCT 47.2 (H) 09/05/2017   MCV 93 09/05/2017   PLT  387 09/05/2017   Lab Results  Component Value Date   NA 140 09/05/2017   K 3.9 09/05/2017  CO2 26 09/05/2017   GLUCOSE 83 09/05/2017   BUN 11 09/05/2017   CREATININE 0.99 09/05/2017   BILITOT 0.5 09/05/2017   ALKPHOS 59 09/05/2017   AST 17 09/05/2017   ALT 11 09/05/2017   PROT 7.2 09/05/2017   ALBUMIN 4.7 09/05/2017   CALCIUM 10.0 09/05/2017   ANIONGAP 12 03/02/2016   Lab Results  Component Value Date   CHOL 209 (H) 09/05/2017   Lab Results  Component Value Date   HDL 85 09/05/2017   Lab Results  Component Value Date   LDLCALC 111 (H) 09/05/2017   Lab Results  Component Value Date   TRIG 65 09/05/2017   Lab Results  Component Value Date   CHOLHDL 2.5 09/05/2017   Lab Results  Component Value Date   HGBA1C 5.3 01/26/2021       Assessment & Plan:   Problem List Items Addressed This Visit     Primary hypertension    BP well controlled at this time.  Continue to monitor as this can become elevated following COVID-19 infection.  No alarm symptoms. No changes to medications today.       COVID-19 - Primary    Recent COVID-19 infection- doing much better but residual cough is still quite bothersome.  Lungs are clear today on auscultation with good movement.  No alarm symptoms or concerns for worsening Pna at this time.  Will send hycodan cough syrup to help with cough while sleeping.  Recommend coughing and deep breathing exercises.  Monitor for new or worsening symptoms and report immediately.       Relevant Medications   HYDROcodone bit-homatropine (HYCODAN) 5-1.5 MG/5ML syrup   Subacute cough    Suspect residual bronchitis related to recent COVID-19 infection and viral Pna. No alarm symptoms present today to indicate worsening Pna or bacterial transition.  Will plan to treat cough with hycodan, deep breathing exercises, and continued mucinex.  Monitor for fever, chills, body aches, increasing shortness or breath, or worsening fatigue or sputum  production and notify immediately if any of these occur. Low threshold for azithromycin if symptoms persist or progress.       Relevant Medications   HYDROcodone bit-homatropine (HYCODAN) 5-1.5 MG/5ML syrup     Meds ordered this encounter  Medications   HYDROcodone bit-homatropine (HYCODAN) 5-1.5 MG/5ML syrup    Sig: Take 2.5-5 mLs by mouth every 6 (six) hours as needed for cough.    Dispense:  150 mL    Refill:  0     Orma Render, NP

## 2021-03-18 ENCOUNTER — Other Ambulatory Visit (HOSPITAL_BASED_OUTPATIENT_CLINIC_OR_DEPARTMENT_OTHER): Payer: Self-pay

## 2021-03-18 ENCOUNTER — Other Ambulatory Visit: Payer: Self-pay

## 2021-03-18 ENCOUNTER — Ambulatory Visit (HOSPITAL_BASED_OUTPATIENT_CLINIC_OR_DEPARTMENT_OTHER): Payer: Medicare (Managed Care) | Admitting: Nurse Practitioner

## 2021-03-18 ENCOUNTER — Encounter (HOSPITAL_BASED_OUTPATIENT_CLINIC_OR_DEPARTMENT_OTHER): Payer: Self-pay | Admitting: Nurse Practitioner

## 2021-03-18 VITALS — BP 122/82 | HR 96 | Ht 66.0 in | Wt 172.7 lb

## 2021-03-18 DIAGNOSIS — I10 Essential (primary) hypertension: Secondary | ICD-10-CM

## 2021-03-18 DIAGNOSIS — U071 COVID-19: Secondary | ICD-10-CM

## 2021-03-18 DIAGNOSIS — R052 Subacute cough: Secondary | ICD-10-CM

## 2021-03-18 MED ORDER — HYDROCODONE BIT-HOMATROP MBR 5-1.5 MG/5ML PO SOLN
2.5000 mL | Freq: Four times a day (QID) | ORAL | 0 refills | Status: DC | PRN
  Filled 2021-03-18: qty 150, 8d supply, fill #0

## 2021-03-20 DIAGNOSIS — R052 Subacute cough: Secondary | ICD-10-CM | POA: Insufficient documentation

## 2021-03-20 HISTORY — DX: Subacute cough: R05.2

## 2021-03-20 NOTE — Assessment & Plan Note (Signed)
Suspect residual bronchitis related to recent COVID-19 infection and viral Pna. No alarm symptoms present today to indicate worsening Pna or bacterial transition.  Will plan to treat cough with hycodan, deep breathing exercises, and continued mucinex.  Monitor for fever, chills, body aches, increasing shortness or breath, or worsening fatigue or sputum production and notify immediately if any of these occur. Low threshold for azithromycin if symptoms persist or progress.

## 2021-03-20 NOTE — Assessment & Plan Note (Signed)
BP well controlled at this time.  Continue to monitor as this can become elevated following COVID-19 infection.  No alarm symptoms. No changes to medications today.

## 2021-03-20 NOTE — Assessment & Plan Note (Signed)
Recent COVID-19 infection- doing much better but residual cough is still quite bothersome.  Lungs are clear today on auscultation with good movement.  No alarm symptoms or concerns for worsening Pna at this time.  Will send hycodan cough syrup to help with cough while sleeping.  Recommend coughing and deep breathing exercises.  Monitor for new or worsening symptoms and report immediately.

## 2021-03-31 ENCOUNTER — Other Ambulatory Visit: Payer: Self-pay

## 2021-03-31 ENCOUNTER — Other Ambulatory Visit (HOSPITAL_BASED_OUTPATIENT_CLINIC_OR_DEPARTMENT_OTHER): Payer: Self-pay

## 2021-03-31 ENCOUNTER — Ambulatory Visit (HOSPITAL_BASED_OUTPATIENT_CLINIC_OR_DEPARTMENT_OTHER): Payer: Medicare (Managed Care) | Admitting: Nurse Practitioner

## 2021-03-31 ENCOUNTER — Encounter (HOSPITAL_BASED_OUTPATIENT_CLINIC_OR_DEPARTMENT_OTHER): Payer: Self-pay | Admitting: Pharmacist

## 2021-03-31 ENCOUNTER — Encounter (HOSPITAL_BASED_OUTPATIENT_CLINIC_OR_DEPARTMENT_OTHER): Payer: Self-pay | Admitting: Nurse Practitioner

## 2021-03-31 VITALS — BP 117/76 | HR 74 | Resp 12

## 2021-03-31 DIAGNOSIS — Z130 Encounter for screening for diseases of the blood and blood-forming organs and certain disorders involving the immune mechanism: Secondary | ICD-10-CM

## 2021-03-31 DIAGNOSIS — R609 Edema, unspecified: Secondary | ICD-10-CM

## 2021-03-31 DIAGNOSIS — F339 Major depressive disorder, recurrent, unspecified: Secondary | ICD-10-CM

## 2021-03-31 DIAGNOSIS — J020 Streptococcal pharyngitis: Secondary | ICD-10-CM

## 2021-03-31 DIAGNOSIS — R233 Spontaneous ecchymoses: Secondary | ICD-10-CM | POA: Diagnosis not present

## 2021-03-31 DIAGNOSIS — I10 Essential (primary) hypertension: Secondary | ICD-10-CM

## 2021-03-31 DIAGNOSIS — G40309 Generalized idiopathic epilepsy and epileptic syndromes, not intractable, without status epilepticus: Secondary | ICD-10-CM

## 2021-03-31 MED ORDER — AMOXICILLIN-POT CLAVULANATE 875-125 MG PO TABS
1.0000 | ORAL_TABLET | Freq: Two times a day (BID) | ORAL | 0 refills | Status: DC
  Filled 2021-03-31: qty 10, 5d supply, fill #0

## 2021-03-31 MED ORDER — VERAPAMIL HCL ER 120 MG PO CP24
120.0000 mg | ORAL_CAPSULE | Freq: Every day | ORAL | 3 refills | Status: DC
  Filled 2021-03-31: qty 30, 30d supply, fill #0
  Filled 2021-05-07: qty 30, 30d supply, fill #1
  Filled 2021-06-25: qty 30, 30d supply, fill #2
  Filled 2021-07-30: qty 30, 30d supply, fill #3

## 2021-03-31 NOTE — Progress Notes (Signed)
Acute Office Visit  Subjective:    Patient ID: Claudia Elliott, female    DOB: 02-18-1965, 56 y.o.   MRN: [id number]  No chief complaint on file.   HPI Patient is in today for sore throat and swelling in her ankles.  Sore throat Claudia Elliott reports a significantly sore throat following recent COVID-19 infection that will not resolve. She reports that she is having some congestion, but her most significant symptom is the sore throat. She has no known fevers, but is experiencing chills and fatigue.   LE edema Claudia Elliott tells me that over the past several weeks she has noticed an increase in swelling in her lower extremities. She has been working out at [employer] and has not been particularly sedentary so she is not sure what is causing this. She endorses tenderness along the medial malleolus, but no increased warmth, redness, or streaking to either LE. She did recently start on amlodipine for BP control. No shortness of breath, decreased or increased urination, palpitations, wet cough.   Outpatient Medications Prior to Visit  Medication Sig Dispense Refill   acetaminophen (TYLENOL) 500 MG tablet Take 2 tablets (1,000 mg total) by mouth every 8 (eight) hours as needed. Alternate with ibuprofen. 60 tablet 1   Blood Pressure Monitoring (BLOOD PRESSURE KIT) KIT For monitoring BP. ICD-10 I10 1 kit 0   buPROPion (WELLBUTRIN XL) 300 MG 24 hr tablet Take 300 mg by mouth daily.     busPIRone (BUSPAR) 30 MG tablet Take 60 mg by mouth daily.     DULoxetine (CYMBALTA) 60 MG capsule Take 2 capsules by mouth daily.  3   hydroquinone 4 % cream Apply small pea-sized amount nightly to affected dark spots.     levETIRAcetam (KEPPRA) 1000 MG tablet Take 1 tablet (1,000 mg total) by mouth daily. 90 each 3   levETIRAcetam (KEPPRA) 750 MG tablet Take 1 tablet (750 mg total) by mouth daily. 90 each 3   LORazepam (ATIVAN) 0.5 MG tablet Take 1 tablet (0.5 mg total) by mouth daily as needed for anxiety. 30 tablet 2    NONFORMULARY OR COMPOUNDED ITEM Vitamin E vaginal cream 200u/ml.  One ml two to three times weekly.  Disp: 3 month supply. 36 each 3   ondansetron (ZOFRAN-ODT) 4 MG disintegrating tablet Take 1 tablet (4 mg total) by mouth every 8 (eight) hours as needed for nausea or vomiting. 20 tablet 0   phentermine 37.5 MG capsule One capsule by mouth qAM 30 capsule 2   Sulfacetamide Sodium, Acne, 10 % LOTN Apply to face each am.     traZODone (DESYREL) 50 MG tablet Take 1 tablet (50 mg total) by mouth at bedtime. 30 tablet 11   tretinoin (RETIN-A) 0.025 % cream daily as needed.     triamcinolone cream (KENALOG) 0.1 % daily as needed.  0   amLODipine (NORVASC) 5 MG tablet Take 1 tablet (5 mg total) by mouth daily. 30 tablet 1   amLODipine (NORVASC) 5 MG tablet Take 1 tablet (5 mg total) by mouth daily. 90 tablet 1   benzonatate (TESSALON) 100 MG capsule Take 1 capsule (100 mg total) by mouth every 8 (eight) hours. 30 capsule 0   guaiFENesin (MUCINEX) 600 MG 12 hr tablet Take 2 tablets (1,200 mg total) by mouth 2 (two) times daily. 30 tablet 1   HYDROcodone bit-homatropine (HYCODAN) 5-1.5 MG/5ML syrup Take 2.5-5 mLs by mouth every 6 (six) hours as needed for cough. 150 mL 0   No facility-administered  medications prior to visit.    Allergies  Allergen Reactions   Dilaudid [Hydromorphone] Hives    Out of it, takes a long time to come back   Percocet [Oxycodone-Acetaminophen] Itching    Review of Systems All review of systems negative except what is listed in the HPI     Objective:    Physical Exam Vitals and nursing note reviewed.  Constitutional:      Appearance: Normal appearance.  HENT:     Head: Normocephalic.     Right Ear: Tympanic membrane normal.     Left Ear: Tympanic membrane normal.     Nose: Congestion present.     Mouth/Throat:     Pharynx: Oropharyngeal exudate and posterior oropharyngeal erythema present.  Eyes:     Extraocular Movements: Extraocular movements intact.      Conjunctiva/sclera: Conjunctivae normal.     Pupils: Pupils are equal, round, and reactive to light.  Cardiovascular:     Rate and Rhythm: Normal rate and regular rhythm.     Pulses: Normal pulses.     Heart sounds: Normal heart sounds.  Pulmonary:     Effort: Pulmonary effort is normal.     Breath sounds: Normal breath sounds. No wheezing or rhonchi.  Abdominal:     General: Bowel sounds are normal. There is no distension.     Palpations: Abdomen is soft.     Tenderness: There is no abdominal tenderness. There is no guarding.  Musculoskeletal:        General: Normal range of motion.     Cervical back: Normal range of motion.     Right lower leg: Edema present.     Left lower leg: Edema present.  Lymphadenopathy:     Cervical: Cervical adenopathy present.  Skin:    Capillary Refill: Capillary refill takes less than 2 seconds.     Findings: Bruising present.     Comments: Scattered bruising noted along bilateral anterior thighs, and upper extremities. Single bruise noted on lateral right calf.   Neurological:     General: No focal deficit present.     Mental Status: She is alert and oriented to person, place, and time.  Psychiatric:        Mood and Affect: Mood normal.        Behavior: Behavior normal.        Thought Content: Thought content normal.        Judgment: Judgment normal.    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 Currently followed with neurology, however, her provider has left and she has been seeing multiple providers within the practice since that time. Patient endorses continued seizures of convulsive nature with the last one occurring over the weekend.  At this time not well controlled. Recommend patient reach out to  neurology for further evaluation and recommendations on medication management. If she is unable to establish with a new provider, we will certainly send a referral for this for her.  Seek emergency evaluation if additional seizures present.       Episode of recurrent major depressive disorder [facility])    Patient tearful in interview today with concerns over her health. She is understandable tired of being sick  and is frustrated by her chronic conditions and has concerns over new conditions. Patient mentions today that she is concerned about having lupus, but unable to discern why she feels this may be present or if evaluation is being conducted by other providers to rule this out. She also expresses concerns over the family dynamics with her adult son who has come to live with her.  Recent and current illnesses have significantly impacted the patient, understandably. Patient reassured that we will help her through this time and help to stabilize her conditions and work with her and her specialists to come up with a plan of care that best meets her needs.  No medication changes today.       Primary hypertension    Chronic. BP well controlled today.  LE edema since starting amlodipine at last visit. Suspect this is the cause of her edema. No alarm symptoms present at this time.  Recommend stop amlodipine and we will start verapamil to see if we can continue with good BP control and avoid edema present.  Recommend monitoring BP at home and reporting elevations in BP > 130/85 for several readings on a repeating basis. Notify of any new symptoms of vision changes, shortness of breath, chest pain, palpitations, weakness, or new or worsening edema. F/U in 3 months or sooner if needed.       Relevant Medications   verapamil (VERELAN PM) 120 MG 24 hr capsule   Bruising tendency    Multiple bruises noted scattered today. She reports that she does bruise easily, but the origin is unknown.  Will obtain labs  today for further evaluation.  Recommend careful monitoring of new or worsening bruising, signs of bleeding, weight loss, or weakness. No signs of bleeding present today.  Will follow based on labs.       Relevant Orders   CBC with Differential/Platelet (Completed)   Comprehensive metabolic panel (Completed)   TSH (Completed)   T4 (Completed)   T3 (Completed)   Iron, TIBC and Ferritin Panel (Completed)   Strep throat - Primary    POsitive strep test in office today. Erythema and exudate present on evaluation with evidence of congestion and sinus pain/pressure. Will send treatment today with antibiotics and recommend rest and increased hydration. Recommend avoiding going to work until she has no fever and has had at least 2 doses of antibiotic therapy. Recommend wearing a mask while in public to help prevent transmission and reduce risk of secondary infection.       Relevant Medications   amoxicillin-clavulanate (AUGMENTIN) 875-125 MG tablet   Edema    Bilateral 1+ edema on lower extremities with no alarm symptoms present.  Suspect amlodipine is cause as this started after she placed on this medication.  Stop amlodipine and start verapamil to see if this helps with symptoms. Continue with exercise and reducing sodium intake. Encouraged adequate water intake.  Notify if symptoms worsen or fail to improve. Labs today.       Relevant Orders   CBC with Differential/Platelet (Completed)   Comprehensive metabolic panel (Completed)   TSH (Completed)   T4 (Completed)   T3 (Completed)   Other Visit Diagnoses     Screening for deficiency anemia       Relevant Orders   CBC with Differential/Platelet (Completed)   Iron, TIBC and Ferritin Panel (Completed)        Meds ordered this encounter  Medications   amoxicillin-clavulanate (AUGMENTIN) 875-125 MG tablet    Sig: Take 1 tablet  by mouth 2 (two) times daily.    Dispense:  10 tablet    Refill:  0   verapamil (VERELAN PM) 120 MG 24  hr capsule    Sig: Take 1 capsule (120 mg total) by mouth at bedtime. For blood pressure    Dispense:  30 capsule    Refill:  3     Orma Render, NP

## 2021-03-31 NOTE — Patient Instructions (Signed)
I want you to stop the amlodipine- I believe this is causing the swelling in your legs. I want to get some labs to check on the bruising and see if there are any concerns with that.   I want you to start verapamil 120mg  at bedtime daily for your blood pressure and monitor for any new symptoms. Let me know if you are having any side effects or notice any changes like headache, dizziness, chest pain, or vision changes.   I have sent in the augmentin for strep. This is twice a day for 5 days. I would try to stay away from public until you have been on the medication for at least 24 hours.

## 2021-04-01 ENCOUNTER — Other Ambulatory Visit (HOSPITAL_BASED_OUTPATIENT_CLINIC_OR_DEPARTMENT_OTHER): Payer: Self-pay

## 2021-04-01 LAB — IRON,TIBC AND FERRITIN PANEL
Ferritin: 184 ng/mL — ABNORMAL HIGH (ref 15–150)
Iron Saturation: 38 % (ref 15–55)
Iron: 95 ug/dL (ref 27–159)
Total Iron Binding Capacity: 252 ug/dL (ref 250–450)
UIBC: 157 ug/dL (ref 131–425)

## 2021-04-01 LAB — COMPREHENSIVE METABOLIC PANEL
ALT: 25 IU/L (ref 0–32)
AST: 36 IU/L (ref 0–40)
Albumin/Globulin Ratio: 2.2 (ref 1.2–2.2)
Albumin: 5 g/dL — ABNORMAL HIGH (ref 3.8–4.9)
Alkaline Phosphatase: 72 IU/L (ref 44–121)
BUN/Creatinine Ratio: 10 (ref 9–23)
BUN: 10 mg/dL (ref 6–24)
Bilirubin Total: 0.5 mg/dL (ref 0.0–1.2)
CO2: 25 mmol/L (ref 20–29)
Calcium: 10.8 mg/dL — ABNORMAL HIGH (ref 8.7–10.2)
Chloride: 97 mmol/L (ref 96–106)
Creatinine, Ser: 0.96 mg/dL (ref 0.57–1.00)
Globulin, Total: 2.3 g/dL (ref 1.5–4.5)
Glucose: 79 mg/dL (ref 70–99)
Potassium: 4.1 mmol/L (ref 3.5–5.2)
Sodium: 138 mmol/L (ref 134–144)
Total Protein: 7.3 g/dL (ref 6.0–8.5)
eGFR: 69 mL/min/{1.73_m2} (ref >59)

## 2021-04-01 LAB — CBC WITH DIFFERENTIAL/PLATELET
Basophils Absolute: 0.1 10*3/uL (ref 0.0–0.2)
Basos: 1 %
EOS (ABSOLUTE): 0.2 10*3/uL (ref 0.0–0.4)
Eos: 3 %
Hematocrit: 43.7 % (ref 34.0–46.6)
Hemoglobin: 14.6 g/dL (ref 11.1–15.9)
Immature Grans (Abs): 0 10*3/uL (ref 0.0–0.1)
Immature Granulocytes: 0 %
Lymphocytes Absolute: 1.9 10*3/uL (ref 0.7–3.1)
Lymphs: 40 %
MCH: 29.6 pg (ref 26.6–33.0)
MCHC: 33.4 g/dL (ref 31.5–35.7)
MCV: 89 fL (ref 79–97)
Monocytes Absolute: 0.5 10*3/uL (ref 0.1–0.9)
Monocytes: 11 %
Neutrophils Absolute: 2 10*3/uL (ref 1.4–7.0)
Neutrophils: 45 %
Platelets: 421 10*3/uL (ref 150–450)
RBC: 4.94 x10E6/uL (ref 3.77–5.28)
RDW: 13.6 % (ref 11.7–15.4)
WBC: 4.6 10*3/uL (ref 3.4–10.8)

## 2021-04-01 LAB — TSH: TSH: 1.35 u[IU]/mL (ref 0.450–4.500)

## 2021-04-01 LAB — T4: T4, Total: 8.4 ug/dL (ref 4.5–12.0)

## 2021-04-01 LAB — T3: T3, Total: 102 ng/dL (ref 71–180)

## 2021-04-02 ENCOUNTER — Encounter (HOSPITAL_BASED_OUTPATIENT_CLINIC_OR_DEPARTMENT_OTHER): Payer: Self-pay | Admitting: Nurse Practitioner

## 2021-04-02 DIAGNOSIS — R609 Edema, unspecified: Secondary | ICD-10-CM

## 2021-04-02 DIAGNOSIS — J020 Streptococcal pharyngitis: Secondary | ICD-10-CM | POA: Insufficient documentation

## 2021-04-02 DIAGNOSIS — R233 Spontaneous ecchymoses: Secondary | ICD-10-CM | POA: Insufficient documentation

## 2021-04-02 HISTORY — DX: Edema, unspecified: R60.9

## 2021-04-02 HISTORY — DX: Spontaneous ecchymoses: R23.3

## 2021-04-02 HISTORY — DX: Streptococcal pharyngitis: J02.0

## 2021-04-02 NOTE — Assessment & Plan Note (Signed)
Multiple bruises noted scattered today. She reports that she does bruise easily, but the origin is unknown.  Will obtain labs today for further evaluation.  Recommend careful monitoring of new or worsening bruising, signs of bleeding, weight loss, or weakness. No signs of bleeding present today.  Will follow based on labs.

## 2021-04-02 NOTE — Assessment & Plan Note (Signed)
>>  ASSESSMENT AND PLAN FOR EPILEPSY, GENERALIZED, CONVULSIVE ([facility]) WRITTEN ON 04/02/2021  7:40 AM BY Zev Blue E, NP  Chronic. Currently followed with neurology, however, her provider has left and she has been seeing multiple providers within the practice since that time. Patient endorses continued seizures of convulsive nature with the last one occurring over the weekend.  At this time not well controlled. Recommend patient reach out to neurology for further evaluation and recommendations on medication management. If she is unable to establish with a new provider, we will certainly send a referral for this for her.  Seek emergency evaluation if additional seizures present.

## 2021-04-02 NOTE — Assessment & Plan Note (Signed)
Patient tearful in interview today with concerns over her health. She is understandable tired of being sick and is frustrated by her chronic conditions and has concerns over new conditions. Patient mentions today that she is concerned about having lupus, but unable to discern why she feels this may be present or if evaluation is being conducted by other providers to rule this out. She also expresses concerns over the family dynamics with her adult son who has come to live with her.  Recent and current illnesses have significantly impacted the patient, understandably. Patient reassured that we will help her through this time and help to stabilize her conditions and work with her and her specialists to come up with a plan of care that best meets her needs.  No medication changes today.

## 2021-04-02 NOTE — Assessment & Plan Note (Signed)
Bilateral 1+ edema on lower extremities with no alarm symptoms present.  Suspect amlodipine is cause as this started after she placed on this medication.  Stop amlodipine and start verapamil to see if this helps with symptoms. Continue with exercise and reducing sodium intake. Encouraged adequate water intake.  Notify if symptoms worsen or fail to improve. Labs today.

## 2021-04-02 NOTE — Assessment & Plan Note (Signed)
Chronic. Currently followed with neurology, however, her provider has left and she has been seeing multiple providers within the practice since that time. Patient endorses continued seizures of convulsive nature with the last one occurring over the weekend.  At this time not well controlled. Recommend patient reach out to neurology for further evaluation and recommendations on medication management. If she is unable to establish with a new provider, we will certainly send a referral for this for her.  Seek emergency evaluation if additional seizures present.

## 2021-04-02 NOTE — Assessment & Plan Note (Signed)
POsitive strep test in office today. Erythema and exudate present on evaluation with evidence of congestion and sinus pain/pressure. Will send treatment today with antibiotics and recommend rest and increased hydration. Recommend avoiding going to work until she has no fever and has had at least 2 doses of antibiotic therapy. Recommend wearing a mask while in public to help prevent transmission and reduce risk of secondary infection.

## 2021-04-02 NOTE — Assessment & Plan Note (Signed)
Chronic. BP well controlled today.  LE edema since starting amlodipine at last visit. Suspect this is the cause of her edema. No alarm symptoms present at this time.  Recommend stop amlodipine and we will start verapamil to see if we can continue with good BP control and avoid edema present.  Recommend monitoring BP at home and reporting elevations in BP > 130/85 for several readings on a repeating basis. Notify of any new symptoms of vision changes, shortness of breath, chest pain, palpitations, weakness, or new or worsening edema. F/U in 3 months or sooner if needed.

## 2021-04-08 ENCOUNTER — Telehealth (HOSPITAL_BASED_OUTPATIENT_CLINIC_OR_DEPARTMENT_OTHER): Payer: Self-pay

## 2021-04-09 ENCOUNTER — Other Ambulatory Visit (HOSPITAL_BASED_OUTPATIENT_CLINIC_OR_DEPARTMENT_OTHER): Payer: Self-pay

## 2021-04-09 ENCOUNTER — Encounter (HOSPITAL_BASED_OUTPATIENT_CLINIC_OR_DEPARTMENT_OTHER): Payer: Self-pay | Admitting: Nurse Practitioner

## 2021-04-09 ENCOUNTER — Ambulatory Visit (INDEPENDENT_AMBULATORY_CARE_PROVIDER_SITE_OTHER): Payer: Medicare (Managed Care) | Admitting: Nurse Practitioner

## 2021-04-09 ENCOUNTER — Other Ambulatory Visit: Payer: Self-pay

## 2021-04-09 DIAGNOSIS — J020 Streptococcal pharyngitis: Secondary | ICD-10-CM | POA: Diagnosis not present

## 2021-04-09 MED ORDER — AMOXICILLIN-POT CLAVULANATE 875-125 MG PO TABS
1.0000 | ORAL_TABLET | Freq: Two times a day (BID) | ORAL | 0 refills | Status: DC
  Filled 2021-04-09: qty 20, 10d supply, fill #0

## 2021-04-09 MED ORDER — FLUCONAZOLE 150 MG PO TABS
ORAL_TABLET | ORAL | 2 refills | Status: DC
  Filled 2021-04-09: qty 2, 2d supply, fill #0

## 2021-04-09 NOTE — Assessment & Plan Note (Signed)
Suspect incomplete treatment of streptococcal infection resulting in recurrence of bacterial growth.  ?Will send in longer course of antibiotic therapy to aid in complete treatment of symptoms.  ?Patient instructed to contact me if the symptoms are not improved over the weekend and we can consider culture or change in abx therapy.  ?

## 2021-04-09 NOTE — Telephone Encounter (Signed)
Scheduled telemed for today 3/2 ?

## 2021-04-09 NOTE — Patient Instructions (Addendum)
Immune System Booster ? ?Vitamin D3 2000 - 5000 units a day ?Vitamin C 500 - 1000 mg a day ?Quercetin 250 mg a day ?Zinc 25 - 50mg  a day ?Melatonin 5 mg at bedtime ? ?This combo is recommended to help boost the immune system and protect against the COVID virus and the flu.  ? ?Please call and talk with Alyse Low if you are not feeling better by Monday so she can let me know.  ? ?

## 2021-04-09 NOTE — Telephone Encounter (Signed)
Pt called wondering about if she needing another appt regarding her sore throat or if another round of antibiotics to just be sent in. I let pt know that DNP Early hasn't review the message yet and when she does the CAM will give her a call. Pt also discussed possibly getting on a brand to boost her immune system. ? ?Please advise. ?

## 2021-04-09 NOTE — Progress Notes (Signed)
Virtual Visit Encounter ? ?telephone visit. ? ? ?I connected with  [name] on 04/09/21 at  3:10 PM EST by secure audio and/or video enabled telemedicine application. I verified that I am speaking with the correct person using two identifiers. ?  ?I introduced myself as a Designer, jewellery with the practice. The limitations of evaluation and management by telemedicine discussed with the patient and the availability of in person appointments. The patient expressed verbal understanding and consent to proceed. ? ?Participating parties in this visit include: Myself and patient ? ?The patient is: Patient Location: Home ?I am: Provider Location: Office/Clinic ?Subjective:   ? ?CC and HPI: Claudia Elliott is a 56 y.o. year old female presenting for new evaluation and treatment of sore throat. Patient endorses completing recent abx therapy with resolution of symptoms. The day after stopping antibiotics, she reports her sore throat returned and has progressively worsened to be severe in nature. She endorses some sinus p/p, no cough. Unsure of fevers. Body aches are present.  ? ?Past medical history, Surgical history, Family history not pertinant except as noted below, Social history, Allergies, and medications have been entered into the medical record, reviewed, and corrections made.  ? ?Review of Systems:  ?All review of systems negative except what is listed in the HPI ? ?Objective:   ? ?Alert and oriented x 4 ?Speaking in clear sentences with no shortness of breath. ?No distress. ? ?Impression and Recommendations:   ? ?Problem List Items Addressed This Visit   ? ? Strep throat - Primary  ?  Suspect incomplete treatment of streptococcal infection resulting in recurrence of bacterial growth.  ?Will send in longer course of antibiotic therapy to aid in complete treatment of symptoms.  ?Patient instructed to contact me if the symptoms are not improved over the weekend and we can consider culture or change in abx  therapy.  ?  ?  ? Relevant Medications  ? amoxicillin-clavulanate (AUGMENTIN) 875-125 MG tablet  ? fluconazole (DIFLUCAN) 150 MG tablet  ? ? ?orders and follow up as documented in EMR ?I discussed the assessment and treatment plan with the patient. The patient was provided an opportunity to ask questions and all were answered. The patient agreed with the plan and demonstrated an understanding of the instructions. ?  ?The patient was advised to call back or seek an in-person evaluation if the symptoms worsen or if the condition fails to improve as anticipated. ? ?Follow-Up: prn ? ?I provided 18 minutes of non-face-to-face interaction with this non face-to-face encounter including intake, same-day documentation, and chart review.  ? ?Orma Render, NP , DNP, AGNP-c ?[facility] ?Primary Care & Sports Medicine at [facility] ?[phone] ?[phone] (fax) ? ?

## 2021-04-13 ENCOUNTER — Other Ambulatory Visit (HOSPITAL_BASED_OUTPATIENT_CLINIC_OR_DEPARTMENT_OTHER): Payer: Self-pay

## 2021-04-23 ENCOUNTER — Other Ambulatory Visit (HOSPITAL_BASED_OUTPATIENT_CLINIC_OR_DEPARTMENT_OTHER): Payer: Self-pay

## 2021-04-24 ENCOUNTER — Other Ambulatory Visit (HOSPITAL_BASED_OUTPATIENT_CLINIC_OR_DEPARTMENT_OTHER): Payer: Self-pay

## 2021-04-28 ENCOUNTER — Other Ambulatory Visit (HOSPITAL_BASED_OUTPATIENT_CLINIC_OR_DEPARTMENT_OTHER): Payer: Self-pay

## 2021-04-28 ENCOUNTER — Ambulatory Visit (INDEPENDENT_AMBULATORY_CARE_PROVIDER_SITE_OTHER): Payer: Medicare (Managed Care) | Admitting: Nurse Practitioner

## 2021-04-28 ENCOUNTER — Other Ambulatory Visit: Payer: Self-pay

## 2021-04-28 ENCOUNTER — Encounter (HOSPITAL_BASED_OUTPATIENT_CLINIC_OR_DEPARTMENT_OTHER): Payer: Self-pay | Admitting: Nurse Practitioner

## 2021-04-28 VITALS — BP 128/88 | HR 78 | Temp 100.0°F | Ht 66.0 in | Wt 165.8 lb

## 2021-04-28 DIAGNOSIS — K219 Gastro-esophageal reflux disease without esophagitis: Secondary | ICD-10-CM | POA: Diagnosis not present

## 2021-04-28 DIAGNOSIS — Z0000 Encounter for general adult medical examination without abnormal findings: Secondary | ICD-10-CM

## 2021-04-28 MED ORDER — MULTIVITAMIN GUMMIES ADULT PO CHEW
CHEWABLE_TABLET | ORAL | 11 refills | Status: AC
  Filled 2021-04-28: qty 60, fill #0

## 2021-04-28 MED ORDER — PANTOPRAZOLE SODIUM 40 MG PO TBEC
40.0000 mg | DELAYED_RELEASE_TABLET | Freq: Every day | ORAL | 3 refills | Status: DC
  Filled 2021-04-28: qty 30, 30d supply, fill #0
  Filled 2021-05-29: qty 30, 30d supply, fill #1
  Filled 2021-06-25: qty 30, 30d supply, fill #2
  Filled 2021-07-30: qty 30, 30d supply, fill #3

## 2021-04-28 MED ORDER — SUCRALFATE 1 G PO TABS
1.0000 g | ORAL_TABLET | Freq: Four times a day (QID) | ORAL | 0 refills | Status: DC
  Filled 2021-04-28: qty 18, 5d supply, fill #0
  Filled 2021-04-28: qty 102, 25d supply, fill #0

## 2021-04-28 NOTE — Assessment & Plan Note (Signed)
At this time strep throat symptoms appear to have resolved, however, overlapping symptoms of mucous production, epigastric tenderness, and night time cough have presented.  ?Symptoms consistent with GERD. No alarm sx present at this time.  ?Recommend carafate four times a day for 30 days and pantoprazole daily for 3 months to help with symptom resolution.  ?Recommendations for foods/drinks to avoid discussed and provided on handout for patient today.  ?She will f/u if symptoms fail to improve or new sx develop. ?If sx continue with discontinuation of medication, recommend H. Pylori testing and f/u with GI for further evaluation and recommendations.  ?

## 2021-04-28 NOTE — Progress Notes (Signed)
? ?Acute Office Visit ? ?Subjective:  ? ? Patient ID: Claudia Elliott, female    DOB: 1965/05/11, 56 y.o.   MRN: [id number] ? ?Chief Complaint  ?Patient presents with  ? Follow-up  ?  Patient is still not feeling better, still coughing. Throat is still sore. Patient needs refill phentermine.   ? ? ?HPI ?Patient is in today for cough and sore throat.  ?Patient was seen and diagnosed with strep throat approximately one month ago and treatment was extended approximately 3 weeks ago when resolution of symptoms did not occur.  ?At this time she reports: ?- continued cough- worse at night  ?- epigastric tenderness ?- increased mucous production  ?- sore throat ?- denies body aches, fevers, chills, nausea ?- she also endorses constipation, increased belching and gas, and bloating ? ?Past Medical History:  ?Diagnosis Date  ? Abnormal Pap smear   ? repeat pap  ? Allergy   ? Depression   ? Fibroid   ? Heart murmur   ? Migraine   ? Panic attacks   ? Primary hypertension 03/05/2021  ? Seizures ([place])   ? x 3 , last seizure 06/23/14  ? Subacute cough 03/20/2021  ? ? ?Past Surgical History:  ?Procedure Laterality Date  ? APPENDECTOMY    ? BREAST BIOPSY Right 2013  ? BREAST CYST ASPIRATION Right 12/2012  ? KNEE SURGERY Right 07/19/2017  ? ? ?Family History  ?Problem Relation Age of Onset  ? Diabetes Maternal Grandmother   ? Hypertension Maternal Grandmother   ? Hypertension Mother   ? ? ?Social History  ? ?Socioeconomic History  ? Marital status: Divorced  ?  Spouse name: Not on file  ? Number of children: 1  ? Years of education: MA  ? Highest education level: Not on file  ?Occupational History  ? Occupation: [employer]  ?  Employer: [employer]  ?Tobacco Use  ? Smoking status: Former  ?  Types: Cigarettes  ? Smokeless tobacco: Never  ? Tobacco comments:  ?  Quit over 20 years ago. Only smoked for less than a year  ?Vaping Use  ? Vaping Use: Never used  ?Substance and Sexual Activity  ? Alcohol use: Yes  ?   Alcohol/week: 2.0 standard drinks  ?  Types: 2 Glasses of wine per week  ? Drug use: No  ? Sexual activity: Not Currently  ?  Birth control/protection: Post-menopausal  ?Other Topics Concern  ? Not on file  ?Social History Narrative  ? Patient lives at home with son.  ? Patient is right handed.  ? Patient has a masters degree  ? Caffeine Use: 1 cup of tea daily  ? ?Social Determinants of Health  ? ?Financial Resource Strain: Not on file  ?Food Insecurity: Not on file  ?Transportation Needs: Not on file  ?Physical Activity: Not on file  ?Stress: Not on file  ?Social Connections: Not on file  ?Intimate Partner Violence: Not on file  ? ? ?Outpatient Medications Prior to Visit  ?Medication Sig Dispense Refill  ? acetaminophen (TYLENOL) 500 MG tablet Take 2 tablets (1,000 mg total) by mouth every 8 (eight) hours as needed. Alternate with ibuprofen. 60 tablet 1  ? amoxicillin-clavulanate (AUGMENTIN) 875-125 MG tablet Take 1 tablet by mouth 2 (two) times daily. 20 tablet 0  ? Blood Pressure Monitoring (BLOOD PRESSURE KIT) KIT For monitoring BP. ICD-10 I10 1 kit 0  ? buPROPion (WELLBUTRIN XL) 300 MG 24 hr tablet Take 300 mg by mouth  daily.    ? busPIRone (BUSPAR) 30 MG tablet Take 60 mg by mouth daily.    ? DULoxetine (CYMBALTA) 60 MG capsule Take 2 capsules by mouth daily.  3  ? fluconazole (DIFLUCAN) 150 MG tablet Take one tablet by mouth at the first sign of symptoms of yeast. If no resolution, repeat dose in 72 hours. 2 tablet 2  ? hydroquinone 4 % cream Apply small pea-sized amount nightly to affected dark spots.    ? levETIRAcetam (KEPPRA) 1000 MG tablet Take 1 tablet (1,000 mg total) by mouth daily. 90 each 3  ? levETIRAcetam (KEPPRA) 750 MG tablet Take 1 tablet (750 mg total) by mouth daily. 90 each 3  ? LORazepam (ATIVAN) 0.5 MG tablet Take 1 tablet (0.5 mg total) by mouth daily as needed for anxiety. 30 tablet 2  ? NONFORMULARY OR COMPOUNDED ITEM Vitamin E vaginal cream 200u/ml.  One ml two to three times weekly.   Disp: 3 month supply. 36 each 3  ? ondansetron (ZOFRAN-ODT) 4 MG disintegrating tablet Take 1 tablet (4 mg total) by mouth every 8 (eight) hours as needed for nausea or vomiting. 20 tablet 0  ? phentermine 37.5 MG capsule One capsule by mouth qAM 30 capsule 2  ? Sulfacetamide Sodium, Acne, 10 % LOTN Apply to face each am.    ? traZODone (DESYREL) 50 MG tablet Take 1 tablet (50 mg total) by mouth at bedtime. 30 tablet 11  ? tretinoin (RETIN-A) 0.025 % cream daily as needed.    ? triamcinolone cream (KENALOG) 0.1 % daily as needed.  0  ? verapamil (VERELAN PM) 120 MG 24 hr capsule Take 1 capsule (120 mg total) by mouth at bedtime. For blood pressure 30 capsule 3  ? ?No facility-administered medications prior to visit.  ? ? ?Allergies  ?Allergen Reactions  ? Dilaudid [Hydromorphone] Hives  ?  Out of it, takes a long time to come back  ? Percocet [Oxycodone-Acetaminophen] Itching  ? ? ?Review of Systems ?All review of systems negative except what is listed in the HPI ? ?   ?Objective:  ?  ?Physical Exam ?Vitals and nursing note reviewed.  ?Constitutional:   ?   General: She is not in acute distress. ?   Appearance: Normal appearance. She is not ill-appearing.  ?HENT:  ?   Head: Normocephalic.  ?   Mouth/Throat:  ?   Mouth: Mucous membranes are moist.  ?   Pharynx: Oropharynx is clear. No oropharyngeal exudate or posterior oropharyngeal erythema.  ?Eyes:  ?   Extraocular Movements: Extraocular movements intact.  ?   Conjunctiva/sclera: Conjunctivae normal.  ?   Pupils: Pupils are equal, round, and reactive to light.  ?Cardiovascular:  ?   Rate and Rhythm: Normal rate and regular rhythm.  ?   Pulses: Normal pulses.  ?   Heart sounds: Murmur heard.  ?Pulmonary:  ?   Effort: Pulmonary effort is normal.  ?   Breath sounds: Normal breath sounds. No wheezing or rhonchi.  ?Abdominal:  ?   General: Bowel sounds are normal. There is no distension.  ?   Palpations: Abdomen is soft. There is no mass.  ?   Tenderness: There is  abdominal tenderness in the epigastric area. There is no right CVA tenderness, left CVA tenderness, guarding or rebound.  ?Musculoskeletal:  ?   Cervical back: Normal range of motion. No tenderness.  ?   Right lower leg: No edema.  ?   Left lower leg: No edema.  ?  Lymphadenopathy:  ?   Cervical: No cervical adenopathy.  ?Skin: ?   General: Skin is warm and dry.  ?   Capillary Refill: Capillary refill takes less than 2 seconds.  ?Neurological:  ?   General: No focal deficit present.  ?   Mental Status: She is alert and oriented to person, place, and time.  ?Psychiatric:     ?   Mood and Affect: Mood normal.     ?   Behavior: Behavior normal.     ?   Thought Content: Thought content normal.     ?   Judgment: Judgment normal.  ? ? ?BP 128/88   Pulse 78   Temp 100 ?F (37.8 ?C)   Ht _0  (1.676 m)   Wt 165 lb 12.8 oz (75.2 kg)   LMP 06/13/2014 (Approximate)   SpO2 97%   BMI 26.76 kg/m?  ?Wt Readings from Last 3 Encounters:  ?04/28/21 165 lb 12.8 oz (75.2 kg)  ?03/18/21 172 lb 11.2 oz (78.3 kg)  ?03/06/21 177 lb (80.3 kg)  ? ? ?Health Maintenance Due  ?Topic Date Due  ? Zoster Vaccines- Shingrix (1 of 2) Never done  ? COVID-19 Vaccine (3 - Pfizer risk series) 07/17/2019  ? ? ?There are no preventive care reminders to display for this patient. ? ? ?Lab Results  ?Component Value Date  ? TSH 1.350 03/31/2021  ? ?Lab Results  ?Component Value Date  ? WBC 4.6 03/31/2021  ? HGB 14.6 03/31/2021  ? HCT 43.7 03/31/2021  ? MCV 89 03/31/2021  ? PLT 421 03/31/2021  ? ?Lab Results  ?Component Value Date  ? NA 138 03/31/2021  ? K 4.1 03/31/2021  ? CO2 25 03/31/2021  ? GLUCOSE 79 03/31/2021  ? BUN 10 03/31/2021  ? CREATININE 0.96 03/31/2021  ? BILITOT 0.5 03/31/2021  ? ALKPHOS 72 03/31/2021  ? AST 36 03/31/2021  ? ALT 25 03/31/2021  ? PROT 7.3 03/31/2021  ? ALBUMIN 5.0 (H) 03/31/2021  ? CALCIUM 10.8 (H) 03/31/2021  ? ANIONGAP 12 03/02/2016  ? EGFR 69 03/31/2021  ? ?Lab Results  ?Component Value Date  ? CHOL 209 (H) 09/05/2017   ? ?Lab Results  ?Component Value Date  ? HDL 85 09/05/2017  ? ?Lab Results  ?Component Value Date  ? LDLCALC 111 (H) 09/05/2017  ? ?Lab Results  ?Component Value Date  ? TRIG 65 09/05/2017  ? ?Lab Results  ?[place]

## 2021-04-28 NOTE — Patient Instructions (Signed)
It was a pleasure seeing you today. I hope your time spent with [place] was pleasant and helpful. Please let us know if there is anything we can do to improve the service you receive.  ? ?Today we discussed concerns with: ? ?Reflux: I have sent in pantoprazole to take once a day for 3 months and Carafate to take 4 times a day (with meals and bedtime) for one month to help with this.  ? ?Constipation: I want you to take docusate sodium (over the counter at the pharmacy) twice a day to help with bowel movements. Be sure you are drinking enough water.  ? ? ? ?Important Office Information ?Lab Results ?If labs were ordered, please note that you will see results through [facility] as soon as they come available from [facility].  ?It takes up to 5 business days for the results to be routed to me and for me to review them once all of the lab results have come through from [facility] I will make recommendations based on your results and send these through [facility] or someone from the office will call you to discuss. If your labs are abnormal, we may contact you to schedule a visit to discuss the results and make recommendations.  ?If you have not heard from [place] within 5 business days or you have waited longer than a week and your lab results have not come through on [facility], please feel free to call the office or send a message through [facility] to follow-up on these labs.  ? ?Referrals ?If referrals were placed today, the office where the referral was sent will contact you either by phone or through [facility] to set up scheduling. Please note that it can take up to a week for the referral office to contact you. If you do not hear from them in a week, please contact the referral office directly to inquire about scheduling.  ? ?Condition Treated ?If your condition worsens or you begin to have new symptoms, please schedule a follow-up appointment for further evaluation. If you are not sure if an appointment is needed, you may call the office to  leave a message for the nurse and someone will contact you with recommendations.  ?If you have an urgent or life threatening emergency, please do not call the office, but seek emergency evaluation by calling 911 or going to the nearest emergency room for evaluation.  ? ?MyChart and Phone Calls ?Please do not use MyChart for urgent messages. It may take up to 3 business days for MyChart messages to be read by staff and if they are unable to handle the request, an additional 3 business days for them to be routed to me and for my response.  ?Messages sent to the provider through [facility] do not come directly to the provider, please allow time for these messages to be routed and for me to respond.  ?We get a large volume of MyChart messages daily and these are responded to in the order received.  ? ?For urgent messages, please call the office at [phone] and speak with the front office staff or leave a message on the line of my assistant for guidance.  ?We are seeing patients from the hours of 8:00 am through 5:00 pm and calls directly to the nurse may not be answered immediately due to seeing patients, but your call will be returned as soon as possible.  ?Phone  messages received after 4:00 PM Monday through Thursday may not be returned until the  following business day. Phone messages received after 11:00 AM on Friday may not be returned until Monday.  ? ?After Hours ?We share on call hours with providers from other offices. If you have an urgent need after hours that cannot wait until the next business day, please contact the on call provider by calling the office number. A nurse will speak with you and contact the provider if needed for recommendations.  ?If you have an urgent or life threatening emergency after hours, please do not call the on call provider, but seek emergency evaluation by calling 911 or going to the nearest emergency room for evaluation.  ? ?Paperwork ?All paperwork requires a minimum of 5 days  to complete and return to you or the designated personnel. Please keep this in mind when bringing in forms or sending requests for paperwork completion to the office.  ?  ?

## 2021-04-29 ENCOUNTER — Other Ambulatory Visit (HOSPITAL_BASED_OUTPATIENT_CLINIC_OR_DEPARTMENT_OTHER): Payer: Self-pay

## 2021-05-04 ENCOUNTER — Encounter (HOSPITAL_BASED_OUTPATIENT_CLINIC_OR_DEPARTMENT_OTHER): Payer: Self-pay

## 2021-05-07 ENCOUNTER — Other Ambulatory Visit (HOSPITAL_BASED_OUTPATIENT_CLINIC_OR_DEPARTMENT_OTHER): Payer: Self-pay

## 2021-05-21 ENCOUNTER — Other Ambulatory Visit (HOSPITAL_BASED_OUTPATIENT_CLINIC_OR_DEPARTMENT_OTHER): Payer: Self-pay

## 2021-05-29 ENCOUNTER — Other Ambulatory Visit (HOSPITAL_BASED_OUTPATIENT_CLINIC_OR_DEPARTMENT_OTHER): Payer: Self-pay

## 2021-06-03 ENCOUNTER — Encounter (HOSPITAL_BASED_OUTPATIENT_CLINIC_OR_DEPARTMENT_OTHER): Payer: Self-pay | Admitting: Pharmacist

## 2021-06-03 ENCOUNTER — Other Ambulatory Visit (HOSPITAL_BASED_OUTPATIENT_CLINIC_OR_DEPARTMENT_OTHER): Payer: Self-pay

## 2021-06-03 ENCOUNTER — Encounter (HOSPITAL_BASED_OUTPATIENT_CLINIC_OR_DEPARTMENT_OTHER): Payer: Self-pay | Admitting: Nurse Practitioner

## 2021-06-03 ENCOUNTER — Ambulatory Visit (INDEPENDENT_AMBULATORY_CARE_PROVIDER_SITE_OTHER): Payer: Medicare (Managed Care) | Admitting: Nurse Practitioner

## 2021-06-03 VITALS — BP 128/88 | HR 96 | Ht 66.0 in | Wt 161.0 lb

## 2021-06-03 DIAGNOSIS — F411 Generalized anxiety disorder: Secondary | ICD-10-CM

## 2021-06-03 DIAGNOSIS — R635 Abnormal weight gain: Secondary | ICD-10-CM

## 2021-06-03 MED ORDER — PHENTERMINE HCL 15 MG PO CAPS
15.0000 mg | ORAL_CAPSULE | ORAL | 0 refills | Status: DC
Start: 2021-06-03 — End: 2021-10-23
  Filled 2021-06-03 – 2021-06-25 (×2): qty 60, 30d supply, fill #0
  Filled 2021-07-30: qty 60, 30d supply, fill #1
  Filled 2021-09-11: qty 60, 30d supply, fill #2

## 2021-06-03 NOTE — Progress Notes (Signed)
?Worthy Keeler, DNP, AGNP-c ?[facility] ?[address] ?[address] ?[place], [place] [address] ?[phone] Office [phone] Fax ? ?ESTABLISHED PATIENT- Chronic Health and/or Follow-Up Visit ? ?Blood pressure 128/88, pulse 96, height '5\' 6"'$  (1.676 m), weight 161 lb (73 kg), last menstrual period 06/13/2014, SpO2 97 %. ? ?No chief complaint on file. ? ? ?HPI ? ?Claudia Elliott  is a 56 y.o. year old female presenting today for evaluation and management of the following: ?Weight management ?She reports that she has been doing very well with the phentermine. ?She denies any symptoms of palpitations, chest pain, shortness of breath, dizziness, elevations in blood pressure, difficulty sleeping, or mood changes ?She was recently seen by her psychiatrist and he cautioned her against use of the phentermine as there is a risk of decreased seizure threshold with this medication.  She would like my advice on this ?She tells me she is doing so well with her weight loss and energy levels she is very hesitant to consider stopping the medication as it is significantly helping her ?Depression/anxiety ?She has recently had an exacerbation of her depression and anxiety symptoms related to family dynamic issues ?She is working closely with her psychiatrist. ?She denies concerns with SI/HI today. ? ?ROS ?All ROS negative with exception of what is listed in HPI ? ?PHYSICAL EXAM ?Physical Exam ?Vitals and nursing note reviewed.  ?Constitutional:   ?   Appearance: Normal appearance.  ?HENT:  ?   Head: Normocephalic.  ?Eyes:  ?   General: Vision grossly intact. Gaze aligned appropriately.  ?   Extraocular Movements: Extraocular movements intact.  ?   Conjunctiva/sclera: Conjunctivae normal.  ?   Pupils: Pupils are equal, round, and reactive to light.  ?Neck:  ?   Vascular: No carotid bruit.  ?Cardiovascular:  ?   Rate and Rhythm: Normal rate and regular rhythm.  ?   Pulses: Normal pulses.  ?   Heart  sounds: Normal heart sounds.  ?Pulmonary:  ?   Effort: Pulmonary effort is normal.  ?   Breath sounds: Normal breath sounds.  ?Abdominal:  ?   General: Abdomen is flat. Bowel sounds are normal.  ?   Palpations: Abdomen is soft.  ?Musculoskeletal:     ?   General: Normal range of motion.  ?   Cervical back: Normal range of motion.  ?   Right lower leg: No edema.  ?   Left lower leg: No edema.  ?Skin: ?   General: Skin is warm and dry.  ?   Capillary Refill: Capillary refill takes less than 2 seconds.  ?Neurological:  ?   General: No focal deficit present.  ?   Mental Status: She is alert and oriented to person, place, and time.  ?   Cranial Nerves: Cranial nerve deficit present.  ?   Sensory: Sensory deficit present.  ?   Motor: Weakness present.  ?   Coordination: Coordination abnormal.  ?Psychiatric:     ?   Mood and Affect: Affect is tearful.     ?   Behavior: Behavior normal.     ?   Thought Content: Thought content normal.     ?   Judgment: Judgment normal.  ? ? ?ASSESSMENT & PLAN ?Diagnoses and all orders for this visit: ? ?Weight gain, abnormal ?-     phentermine 15 MG capsule; Take 1-2 capsules (15-30 mg total) by mouth every morning. ? ?Generalized anxiety disorder ? ? ? ?FOLLOW-UP ?Return in  about 6 months (around 12/03/2021) for telephone- phentermine. ? ?Time: 37 minutes, >50% spent counseling, care coordination, chart review, and documentation.  ? ?Worthy Keeler, DNP, AGNP-c ?06/03/2021 11:25 AM ?

## 2021-06-03 NOTE — Patient Instructions (Signed)
Let's try the 15-'30mg'$  a day of the phentermine to see if we can get the same results. If you start having an increase in seizures, please let me know.  ?

## 2021-06-12 NOTE — Assessment & Plan Note (Signed)
Increased anxiety and depressive symptoms related to family dynamic issues. She is currently working with her psychiatrist to cope with these issues. No alarm symptoms present at this time. Recommend that she reach out if she has any concerning symptoms or she feels that her anxiety or depression are worsening.  ?

## 2021-06-12 NOTE — Assessment & Plan Note (Signed)
>>  ASSESSMENT AND PLAN FOR GENERALIZED ANXIETY DISORDER WRITTEN ON 06/12/2021  8:51 PM BY Shivani Barrantes E, NP  Increased anxiety and depressive symptoms related to family dynamic issues. She is currently working with her psychiatrist to cope with these issues. No alarm symptoms present at this time. Recommend that she reach out if she has any concerning symptoms or she feels that her anxiety or depression are worsening.

## 2021-06-12 NOTE — Assessment & Plan Note (Signed)
She has had significant effective weight loss with dietary changes, exercise, and utilization of phentermine to help in her weight loss journey. ?Discussed the risks of seizure threshold decrease while on phentermine. There is a warning to use cautiously and monitor closely in patients with seizure disorder. At this time, patient is on a very low dose of the medication and has not had any side effects or subsequent seizure activity while on the medication. We discussed the option of reducing the dose to lessen this risk, as she does not wish to stop the medication at this time. I feel that it is reasonable to continue the medication with very close monitoring with the strict understanding that if she is to have a seizure we will have to stop the medication and will not be able to continue. She is in agreement and verbalizes understanding of the risks associated with continued use. We will plan to continue for 3 additional months and monitor closely.  ?

## 2021-06-16 ENCOUNTER — Other Ambulatory Visit (HOSPITAL_BASED_OUTPATIENT_CLINIC_OR_DEPARTMENT_OTHER): Payer: Self-pay

## 2021-06-25 ENCOUNTER — Other Ambulatory Visit (HOSPITAL_BASED_OUTPATIENT_CLINIC_OR_DEPARTMENT_OTHER): Payer: Self-pay

## 2021-07-07 ENCOUNTER — Encounter (HOSPITAL_BASED_OUTPATIENT_CLINIC_OR_DEPARTMENT_OTHER): Payer: Self-pay | Admitting: Nurse Practitioner

## 2021-07-07 ENCOUNTER — Telehealth (INDEPENDENT_AMBULATORY_CARE_PROVIDER_SITE_OTHER): Payer: Medicare (Managed Care) | Admitting: Nurse Practitioner

## 2021-07-07 DIAGNOSIS — M25541 Pain in joints of right hand: Secondary | ICD-10-CM

## 2021-07-07 HISTORY — DX: Pain in joints of right hand: M25.541

## 2021-07-07 NOTE — Assessment & Plan Note (Signed)
Pain, decreased ROM, and swelling to the third finger on the right hand.  Has been monitoring, but symptoms are worsening. Now unable to move the joint actively. Will send referral to orthopedics for evaluation and recommendations. Patient is in agreement to this.  F/U PRN

## 2021-07-07 NOTE — Patient Instructions (Signed)
I have sent a referral to orthopedics for you. They will reach out to schedule this.   Please let me know if you have any further concerns.

## 2021-07-07 NOTE — Progress Notes (Signed)
Virtual Visit Encounter telephone visit.   I connected with  Claudia Elliott on 07/07/21 at 10:50 AM EDT by secure audio telemedicine application. I verified that I am speaking with the correct person using two identifiers.   I introduced myself as a Designer, jewellery with the practice. The limitations of evaluation and management by telemedicine discussed with the patient and the availability of in person appointments. The patient expressed verbal understanding and consent to proceed.  Participating parties in this visit include: Myself and patient  The patient is: Patient Location: Home I am: Provider Location: Office/Clinic Subjective:    CC and HPI: Claudia Elliott is a 56 y.o. year old female presenting for follow up of finger swelling.  Patient reports the following: - pain and swelling to the third finger on the right hand.  - Unable to straighten the DIP joint actively - Can passively straighten the joint, but it is painful.  - previous injury to the finger - coloration and temperature normal, nail growth normal, sensation intact  Past medical history, Surgical history, Family history not pertinant except as noted below, Social history, Allergies, and medications have been entered into the medical record, reviewed, and corrections made.   Review of Systems:  All review of systems negative except what is listed in the HPI  Objective:    Alert and oriented x 4 Speaking in clear sentences with no shortness of breath. No distress.  Impression and Recommendations:    Problem List Items Addressed This Visit     Pain involving joint of finger of right hand - Primary    Pain, decreased ROM, and swelling to the third finger on the right hand.  Has been monitoring, but symptoms are worsening. Now unable to move the joint actively. Will send referral to orthopedics for evaluation and recommendations. Patient is in agreement to this.  F/U PRN       Relevant Orders   AMB  referral to orthopedics    orders and follow up as documented in EMR I discussed the assessment and treatment plan with the patient. The patient was provided an opportunity to ask questions and all were answered. The patient agreed with the plan and demonstrated an understanding of the instructions.   The patient was advised to call back or seek an in-person evaluation if the symptoms worsen or if the condition fails to improve as anticipated.  Follow-Up: prn  I provided 16 minutes of non-face-to-face interaction with this non face-to-face encounter including intake, same-day documentation, and chart review.   Orma Render, NP , DNP, AGNP-c [facility] [phone] (fax)

## 2021-07-16 ENCOUNTER — Ambulatory Visit (HOSPITAL_BASED_OUTPATIENT_CLINIC_OR_DEPARTMENT_OTHER): Payer: Medicare (Managed Care) | Admitting: Orthopaedic Surgery

## 2021-07-22 ENCOUNTER — Ambulatory Visit (HOSPITAL_BASED_OUTPATIENT_CLINIC_OR_DEPARTMENT_OTHER): Payer: Medicare (Managed Care) | Admitting: Orthopaedic Surgery

## 2021-07-22 ENCOUNTER — Encounter (HOSPITAL_BASED_OUTPATIENT_CLINIC_OR_DEPARTMENT_OTHER): Payer: Self-pay

## 2021-07-30 ENCOUNTER — Other Ambulatory Visit (HOSPITAL_BASED_OUTPATIENT_CLINIC_OR_DEPARTMENT_OTHER): Payer: Self-pay

## 2021-07-30 ENCOUNTER — Ambulatory Visit (HOSPITAL_BASED_OUTPATIENT_CLINIC_OR_DEPARTMENT_OTHER): Payer: Medicare (Managed Care) | Admitting: Family Medicine

## 2021-07-30 ENCOUNTER — Other Ambulatory Visit (HOSPITAL_BASED_OUTPATIENT_CLINIC_OR_DEPARTMENT_OTHER): Payer: Self-pay | Admitting: Nurse Practitioner

## 2021-07-30 ENCOUNTER — Encounter (HOSPITAL_BASED_OUTPATIENT_CLINIC_OR_DEPARTMENT_OTHER): Payer: Self-pay

## 2021-07-30 DIAGNOSIS — K219 Gastro-esophageal reflux disease without esophagitis: Secondary | ICD-10-CM

## 2021-07-31 ENCOUNTER — Other Ambulatory Visit (HOSPITAL_BASED_OUTPATIENT_CLINIC_OR_DEPARTMENT_OTHER): Payer: Self-pay

## 2021-08-03 ENCOUNTER — Ambulatory Visit (HOSPITAL_BASED_OUTPATIENT_CLINIC_OR_DEPARTMENT_OTHER): Payer: Medicare (Managed Care) | Admitting: Nurse Practitioner

## 2021-09-07 ENCOUNTER — Telehealth (HOSPITAL_BASED_OUTPATIENT_CLINIC_OR_DEPARTMENT_OTHER): Payer: Self-pay | Admitting: Nurse Practitioner

## 2021-09-07 NOTE — Telephone Encounter (Signed)
Called pt and lvm to schedule AWV with provider.

## 2021-09-11 ENCOUNTER — Other Ambulatory Visit (HOSPITAL_BASED_OUTPATIENT_CLINIC_OR_DEPARTMENT_OTHER): Payer: Self-pay | Admitting: Nurse Practitioner

## 2021-09-11 ENCOUNTER — Other Ambulatory Visit (HOSPITAL_BASED_OUTPATIENT_CLINIC_OR_DEPARTMENT_OTHER): Payer: Self-pay

## 2021-09-11 DIAGNOSIS — K219 Gastro-esophageal reflux disease without esophagitis: Secondary | ICD-10-CM

## 2021-09-11 DIAGNOSIS — I10 Essential (primary) hypertension: Secondary | ICD-10-CM

## 2021-09-14 ENCOUNTER — Other Ambulatory Visit (HOSPITAL_BASED_OUTPATIENT_CLINIC_OR_DEPARTMENT_OTHER): Payer: Self-pay

## 2021-09-14 MED ORDER — PANTOPRAZOLE SODIUM 40 MG PO TBEC
40.0000 mg | DELAYED_RELEASE_TABLET | Freq: Every day | ORAL | 3 refills | Status: DC
  Filled 2021-09-14 – 2021-10-05 (×2): qty 30, 30d supply, fill #0

## 2021-09-14 MED ORDER — VERAPAMIL HCL ER 120 MG PO CP24
120.0000 mg | ORAL_CAPSULE | Freq: Every day | ORAL | 3 refills | Status: DC
  Filled 2021-09-14 – 2021-10-05 (×2): qty 30, 30d supply, fill #0
  Filled 2021-11-25: qty 30, 30d supply, fill #1

## 2021-09-29 ENCOUNTER — Other Ambulatory Visit (HOSPITAL_BASED_OUTPATIENT_CLINIC_OR_DEPARTMENT_OTHER): Payer: Self-pay

## 2021-10-05 ENCOUNTER — Other Ambulatory Visit (HOSPITAL_BASED_OUTPATIENT_CLINIC_OR_DEPARTMENT_OTHER): Payer: Self-pay

## 2021-10-23 ENCOUNTER — Other Ambulatory Visit (HOSPITAL_BASED_OUTPATIENT_CLINIC_OR_DEPARTMENT_OTHER): Payer: Self-pay

## 2021-10-23 ENCOUNTER — Other Ambulatory Visit (HOSPITAL_BASED_OUTPATIENT_CLINIC_OR_DEPARTMENT_OTHER): Payer: Self-pay | Admitting: Nurse Practitioner

## 2021-10-23 DIAGNOSIS — R635 Abnormal weight gain: Secondary | ICD-10-CM

## 2021-10-23 MED ORDER — PHENTERMINE HCL 15 MG PO CAPS
15.0000 mg | ORAL_CAPSULE | ORAL | 0 refills | Status: DC
Start: 2021-10-23 — End: 2021-12-03
  Filled 2021-10-23: qty 87, 43d supply, fill #0
  Filled 2021-10-27: qty 93, 47d supply, fill #0

## 2021-10-26 ENCOUNTER — Other Ambulatory Visit (HOSPITAL_BASED_OUTPATIENT_CLINIC_OR_DEPARTMENT_OTHER): Payer: Self-pay

## 2021-10-27 ENCOUNTER — Telehealth (HOSPITAL_BASED_OUTPATIENT_CLINIC_OR_DEPARTMENT_OTHER): Payer: Self-pay | Admitting: Nurse Practitioner

## 2021-10-27 ENCOUNTER — Other Ambulatory Visit (HOSPITAL_BASED_OUTPATIENT_CLINIC_OR_DEPARTMENT_OTHER): Payer: Self-pay

## 2021-10-27 NOTE — Telephone Encounter (Signed)
Pt is having Intense back pain, and BP is 140/100.Marland Kitchen   Offered a PM -sameday spot an pt had another appt she coudnt accept

## 2021-11-03 ENCOUNTER — Other Ambulatory Visit (HOSPITAL_BASED_OUTPATIENT_CLINIC_OR_DEPARTMENT_OTHER): Payer: Self-pay

## 2021-11-09 ENCOUNTER — Encounter (HOSPITAL_BASED_OUTPATIENT_CLINIC_OR_DEPARTMENT_OTHER): Payer: Self-pay | Admitting: Nurse Practitioner

## 2021-11-09 ENCOUNTER — Ambulatory Visit (INDEPENDENT_AMBULATORY_CARE_PROVIDER_SITE_OTHER): Payer: Medicare (Managed Care) | Admitting: Nurse Practitioner

## 2021-11-09 VITALS — BP 147/89 | HR 82 | Ht 66.0 in | Wt 159.0 lb

## 2021-11-09 DIAGNOSIS — Z0000 Encounter for general adult medical examination without abnormal findings: Secondary | ICD-10-CM | POA: Diagnosis not present

## 2021-11-09 DIAGNOSIS — U071 COVID-19: Secondary | ICD-10-CM

## 2021-11-09 NOTE — Progress Notes (Addendum)
BP (!) 147/89   Pulse 82   Ht 5' 6"  (1.676 m)   Wt 159 lb (72.1 kg)   LMP 06/13/2014 (Approximate)   SpO2 99%   BMI 25.66 kg/m    Subjective:    Patient ID: Claudia Elliott, female    DOB: 03-15-1965, 56 y.o.   MRN: [id number]  HPI: Claudia Elliott is a 56 y.o. female presenting on 11/09/2021 for comprehensive medical examination.   Current medical concerns include: Increased concerns over her mother who has worsening dementia.  Lacey also reports testing positive for COVID-19 on Friday.  She endorses fever, chills, body aches, sore throat, congestion, and cough.  She reports regular vision exams q1-5y: yes She reports regular dental exams q 70m yes Her diet consists of:  Primarily healthy She endorses exercise and/or activity of:  moderate  She  denies excessive  ETOH use  She denies nictoine use  She denies illegal substance use   She is not having menstrual periods.  She  denies abnormal bleeding. She  denies menopausal symptoms.  She is is sexually active. She endorses concerns today about STI: testing ordered: yes  She denies concerns about skin changes today  She denies concerns about bowel changes today  She denies concerns about bladder changes today   Most Recent Depression Screen:     11/09/2021    3:05 PM 06/03/2021   11:21 AM 04/28/2021    2:04 PM 04/02/2021    7:35 AM 01/26/2021    3:32 PM  Depression screen PHQ 2/9  Decreased Interest 1 0 1 0   Down, Depressed, Hopeless 1 0 1 0 0  PHQ - 2 Score 2 0 2 0 0  Altered sleeping 3  1    Tired, decreased energy 2  1    Change in appetite 2  1    Feeling bad or failure about yourself  2  1    Trouble concentrating 1  0    Moving slowly or fidgety/restless 1  0    Suicidal thoughts 0  0    PHQ-9 Score 13  6    Difficult doing work/chores Somewhat difficult  Somewhat difficult     Most Recent Anxiety Screen:      No data to display         Most Recent Fall Screen:    11/09/2021    3:04 PM  06/03/2021   11:21 AM 04/28/2021    2:04 PM 03/05/2021    3:32 PM 08/07/2014    1:52 PM  Fall Risk   Falls in the past year? 1 0 0 0 No  Number falls in past yr: 1 0 0 0   Injury with Fall? 0 0 0 0   Risk for fall due to : History of fall(s) No Fall Risks  No Fall Risks   Follow up Falls evaluation completed;Education provided Falls evaluation completed;Education provided  Falls evaluation completed     All ROS negative except what is listed above and in the HPI.   Past medical history, surgical history, medications, allergies, family history and social history reviewed with patient today and changes made to appropriate areas of the chart.  Past Medical History:  Past Medical History:  Diagnosis Date   Abnormal Pap smear    repeat pap   Allergy    Depression    Fibroid    Heart murmur    Migraine    Panic attacks    Primary hypertension 03/05/2021  Seizures ([place])    x 3 , last seizure 06/23/14   Subacute cough 03/20/2021   Medications:  Current Outpatient Medications on File Prior to Visit  Medication Sig   acetaminophen (TYLENOL) 500 MG tablet Take 2 tablets (1,000 mg total) by mouth every 8 (eight) hours as needed. Alternate with ibuprofen.   amoxicillin-clavulanate (AUGMENTIN) 875-125 MG tablet Take 1 tablet by mouth 2 (two) times daily.   Blood Pressure Monitoring (BLOOD PRESSURE KIT) KIT For monitoring BP. ICD-10 I10   buPROPion (WELLBUTRIN XL) 300 MG 24 hr tablet Take 300 mg by mouth daily.   busPIRone (BUSPAR) 30 MG tablet Take 60 mg by mouth daily.   DULoxetine (CYMBALTA) 60 MG capsule Take 2 capsules by mouth daily.   fluconazole (DIFLUCAN) 150 MG tablet Take one tablet by mouth at the first sign of symptoms of yeast. If no resolution, repeat dose in 72 hours.   hydroquinone 4 % cream Apply small pea-sized amount nightly to affected dark spots.   levETIRAcetam (KEPPRA) 1000 MG tablet Take 1 tablet (1,000 mg total) by mouth daily.   levETIRAcetam (KEPPRA) 750 MG tablet  Take 1 tablet (750 mg total) by mouth daily.   LORazepam (ATIVAN) 0.5 MG tablet Take 1 tablet (0.5 mg total) by mouth daily as needed for anxiety.   Multiple Vitamins-Minerals (MULTIVITAMIN GUMMIES ADULT) CHEW Take 2 gummies by mouth daily.   NONFORMULARY OR COMPOUNDED ITEM Vitamin E vaginal cream 200u/ml.  One ml two to three times weekly.  Disp: 3 month supply.   ondansetron (ZOFRAN-ODT) 4 MG disintegrating tablet Take 1 tablet (4 mg total) by mouth every 8 (eight) hours as needed for nausea or vomiting.   pantoprazole (PROTONIX) 40 MG tablet Take 1 tablet (40 mg total) by mouth daily for three months.   phentermine 15 MG capsule Take 1-2 capsules (15-30 mg total) by mouth every morning.   sucralfate (CARAFATE) 1 g tablet Take 1 tablet (1 g total) by mouth 4 (four) times daily for 1 month, then stop.   Sulfacetamide Sodium, Acne, 10 % LOTN Apply to face each am.   traZODone (DESYREL) 50 MG tablet Take 1 tablet (50 mg total) by mouth at bedtime.   tretinoin (RETIN-A) 0.025 % cream daily as needed.   triamcinolone cream (KENALOG) 0.1 % daily as needed.   verapamil (VERELAN PM) 120 MG 24 hr capsule Take 1 capsule (120 mg total) by mouth at bedtime for blood pressure   No current facility-administered medications on file prior to visit.   Surgical History:  Past Surgical History:  Procedure Laterality Date   APPENDECTOMY     BREAST BIOPSY Right 2013   BREAST CYST ASPIRATION Right 12/2012   KNEE SURGERY Right 07/19/2017   Allergies:  Allergies  Allergen Reactions   Dilaudid [Hydromorphone] Hives    Out of it, takes a long time to come back   Percocet [Oxycodone-Acetaminophen] Itching   Social History:  Social History   Socioeconomic History   Marital status: Divorced    Spouse name: Not on file   Number of children: 1   Years of education: MA   Highest education level: Not on file  Occupational History   Occupation: [employer]    Employer: [employer]   Tobacco Use   Smoking status: Former    Types: Cigarettes   Smokeless tobacco: Never   Tobacco comments:    Quit over 20 years ago. Only smoked for less than a year  Vaping Use  Vaping Use: Never used  Substance and Sexual Activity   Alcohol use: Yes    Alcohol/week: 2.0 standard drinks of alcohol    Types: 2 Glasses of wine per week   Drug use: No   Sexual activity: Not Currently    Birth control/protection: Post-menopausal  Other Topics Concern   Not on file  Social History Narrative   Patient lives at home with son.   Patient is right handed.   Patient has a masters degree   Caffeine Use: 1 cup of tea daily   Social Determinants of Health   Financial Resource Strain: Not on file  Food Insecurity: Not on file  Transportation Needs: Not on file  Physical Activity: Not on file  Stress: Not on file  Social Connections: Not on file  Intimate Partner Violence: Not on file   Social History   Tobacco Use  Smoking Status Former   Types: Cigarettes  Smokeless Tobacco Never  Tobacco Comments   Quit over 20 years ago. Only smoked for less than a year   Social History   Substance and Sexual Activity  Alcohol Use Yes   Alcohol/week: 2.0 standard drinks of alcohol   Types: 2 Glasses of wine per week   Family History:  Family History  Problem Relation Age of Onset   Diabetes Maternal Grandmother    Hypertension Maternal Grandmother    Hypertension Mother        Objective:    BP (!) 147/89   Pulse 82   Ht 5' 6"  (1.676 m)   Wt 159 lb (72.1 kg)   LMP 06/13/2014 (Approximate)   SpO2 99%   BMI 25.66 kg/m   Wt Readings from Last 3 Encounters:  11/09/21 159 lb (72.1 kg)  06/03/21 161 lb (73 kg)  04/28/21 165 lb 12.8 oz (75.2 kg)    Physical Exam Vitals and nursing note reviewed.  Constitutional:      General: She is not in acute distress.    Appearance: She is ill-appearing.  HENT:     Head: Normocephalic and atraumatic.     Right Ear: Hearing, tympanic  membrane, ear canal and external ear normal.     Left Ear: Hearing, tympanic membrane, ear canal and external ear normal.     Nose: Congestion present.     Right Sinus: No maxillary sinus tenderness or frontal sinus tenderness.     Left Sinus: No maxillary sinus tenderness or frontal sinus tenderness.     Mouth/Throat:     Lips: Pink.     Comments: Not visualized.  COVID-positive. Eyes:     General: Lids are normal. Vision grossly intact.     Extraocular Movements: Extraocular movements intact.     Conjunctiva/sclera: Conjunctivae normal.     Pupils: Pupils are equal, round, and reactive to light.     Funduscopic exam:    Right eye: Red reflex present.        Left eye: Red reflex present.    Visual Fields: Right eye visual fields normal and left eye visual fields normal.  Neck:     Thyroid: No thyromegaly.     Vascular: No carotid bruit.  Cardiovascular:     Rate and Rhythm: Regular rhythm. Tachycardia present.     Chest Wall: PMI is not displaced.     Pulses: Normal pulses.          Dorsalis pedis pulses are 2+ on the right side and 2+ on the left side.  Posterior tibial pulses are 2+ on the right side and 2+ on the left side.     Heart sounds: Normal heart sounds. No murmur heard. Pulmonary:     Effort: Pulmonary effort is normal. No respiratory distress.     Breath sounds: Normal breath sounds. No wheezing or rhonchi.  Abdominal:     General: Abdomen is flat. Bowel sounds are normal. There is no distension.     Palpations: Abdomen is soft. There is no hepatomegaly, splenomegaly or mass.     Tenderness: There is no abdominal tenderness. There is no right CVA tenderness, left CVA tenderness, guarding or rebound.  Musculoskeletal:        General: Normal range of motion.     Cervical back: Full passive range of motion without pain, normal range of motion and neck supple. No tenderness.     Right lower leg: No edema.     Left lower leg: No edema.  Feet:     Left foot:      Toenail Condition: Left toenails are normal.  Lymphadenopathy:     Cervical: Cervical adenopathy present.     Upper Body:     Right upper body: No supraclavicular adenopathy.     Left upper body: No supraclavicular adenopathy.  Skin:    General: Skin is warm and dry.     Capillary Refill: Capillary refill takes less than 2 seconds.     Nails: There is no clubbing.  Neurological:     General: No focal deficit present.     Mental Status: She is alert and oriented to person, place, and time.     GCS: GCS eye subscore is 4. GCS verbal subscore is 5. GCS motor subscore is 6.     Sensory: Sensation is intact.     Motor: Motor function is intact.     Coordination: Coordination is intact.     Gait: Gait is intact.     Deep Tendon Reflexes: Reflexes are normal and symmetric.  Psychiatric:        Attention and Perception: Attention normal.        Mood and Affect: Mood normal.        Speech: Speech normal.        Behavior: Behavior normal. Behavior is cooperative.        Thought Content: Thought content normal.        Cognition and Memory: Cognition and memory normal.        Judgment: Judgment normal.     Results for orders placed or performed in visit on 03/31/21  CBC with Differential/Platelet  Result Value Ref Range   WBC 4.6 3.4 - 10.8 x10E3/uL   RBC 4.94 3.77 - 5.28 x10E6/uL   Hemoglobin 14.6 11.1 - 15.9 g/dL   Hematocrit 43.7 34.0 - 46.6 %   MCV 89 79 - 97 fL   MCH 29.6 26.6 - 33.0 pg   MCHC 33.4 31.5 - 35.7 g/dL   RDW 13.6 11.7 - 15.4 %   Platelets 421 150 - 450 x10E3/uL   Neutrophils 45 Not Estab. %   Lymphs 40 Not Estab. %   Monocytes 11 Not Estab. %   Eos 3 Not Estab. %   Basos 1 Not Estab. %   Neutrophils Absolute 2.0 1.4 - 7.0 x10E3/uL   Lymphocytes Absolute 1.9 0.7 - 3.1 x10E3/uL   Monocytes Absolute 0.5 0.1 - 0.9 x10E3/uL   EOS (ABSOLUTE) 0.2 0.0 - 0.4 x10E3/uL   Basophils Absolute 0.1 0.0 -  0.2 x10E3/uL   Immature Granulocytes 0 Not Estab. %   Immature Grans  (Abs) 0.0 0.0 - 0.1 x10E3/uL  Comprehensive metabolic panel  Result Value Ref Range   Glucose 79 70 - 99 mg/dL   BUN 10 6 - 24 mg/dL   Creatinine, Ser 0.96 0.57 - 1.00 mg/dL   eGFR 69 >59 mL/min/1.73   BUN/Creatinine Ratio 10 9 - 23   Sodium 138 134 - 144 mmol/L   Potassium 4.1 3.5 - 5.2 mmol/L   Chloride 97 96 - 106 mmol/L   CO2 25 20 - 29 mmol/L   Calcium 10.8 (H) 8.7 - 10.2 mg/dL   Total Protein 7.3 6.0 - 8.5 g/dL   Albumin 5.0 (H) 3.8 - 4.9 g/dL   Globulin, Total 2.3 1.5 - 4.5 g/dL   Albumin/Globulin Ratio 2.2 1.2 - 2.2   Bilirubin Total 0.5 0.0 - 1.2 mg/dL   Alkaline Phosphatase 72 44 - 121 IU/L   AST 36 0 - 40 IU/L   ALT 25 0 - 32 IU/L  TSH  Result Value Ref Range   TSH 1.350 0.450 - 4.500 uIU/mL  T4  Result Value Ref Range   T4, Total 8.4 4.5 - 12.0 ug/dL  T3  Result Value Ref Range   T3, Total 102 71 - 180 ng/dL  Iron, TIBC and Ferritin Panel  Result Value Ref Range   Total Iron Binding Capacity 252 250 - 450 ug/dL   UIBC 157 131 - 425 ug/dL   Iron 95 27 - 159 ug/dL   Iron Saturation 38 15 - 55 %   Ferritin 184 (H) 15 - 150 ng/mL    MMUNIZATIONS:   - Tdap: Tetanus vaccination status reviewed: last tetanus booster within 10 years. - Influenza:  Patient ill today postponed - Pneumovax: Not applicable - Prevnar: Not applicable - HPV: Not applicable - Zostavax vaccine:  Patient ill today postponed  SCREENING COMPLETED: - Pap smear:  Up-to-date - STI testing: Ordered today -Mammogram:  Up-to-date - Colonoscopy: { Up-to-date - Bone Density: {Not Applicable -Hearing Test: Not Applicable -Spirometry: Not Applicable     Assessment & Plan:   Problem List Items Addressed This Visit   None Visit Diagnoses     Health maintenance examination    -  Primary   Relevant Orders   CBC with Differential/Platelet   Comprehensive metabolic panel   Hemoglobin A1c   Lipid panel   TSH   T4, free   Ct, Ng, Mycoplasmas NAA, Urine   COVID       Encounter for annual  physical exam       Relevant Orders   CBC with Differential/Platelet   Comprehensive metabolic panel   Hemoglobin A1c   Lipid panel   TSH   T4, free   Ct, Ng, Mycoplasmas NAA, Urine          Follow up plan: Return in about 1 year (around 11/10/2022) for CPE.  NEXT PREVENTATIVE PHYSICAL DUE IN 1 YEAR.  PATIENT COUNSELING PROVIDED FOR ALL ADULT PATIENTS:  Consume a well balanced diet low in saturated fats, cholesterol, and moderation in carbohydrates.   This can be as simple as monitoring portion sizes and cutting back on sugary beverages such as soda and juice to start with.    Daily water consumption of at least 64 ounces.  Physical activity at least 180 minutes per week, if just starting out.   This can be as simple as taking the stairs instead of the elevator and  walking 2-3 laps around the office  purposefully every day.   STD protection, partner selection, and regular testing if high risk.  Limited consumption of alcoholic beverages if alcohol is consumed.  For women, I recommend no more than 7 alcoholic beverages per week, spread out throughout the week.  Avoid "binge" drinking or consuming large quantities of alcohol in one setting.   Please let me know if you feel you may need help with reduction or quitting alcohol consumption.   Avoidance of nicotine, if used.  Please let me know if you feel you may need help with reduction or quitting nicotine use.   Daily mental health attention.  This can be in the form of 5 minute daily meditation, prayer, journaling, yoga, reflection, etc.   Purposeful attention to your emotions and mental state can significantly improve your overall wellbeing and Health.  Please know that I am here to help you with all of your health care goals and am happy to work with you to find a solution that works best for you.  The greatest advice I have received with any changes in life are to take it one step at a time, that even means if all you  can focus on is the next 60 seconds, then do that and celebrate your victories.  With any changes in life, you will have set backs, and that is OK. The important thing to remember is, if you have a set back, it is not a failure, it is an opportunity to try again!  Health Maintenance Recommendations Screening Testing Mammogram Every 1 -2 years based on history and risk factors Starting at age 79 Pap Smear Ages 21-39 every 3 years Ages 7-65 every 5 years with HPV testing More frequent testing may be required based on results and history Colon Cancer Screening Every 1-10 years based on test performed, risk factors, and history Starting at age 52 Bone Density Screening Every 2-10 years based on history Starting at age 45 for women Recommendations for men differ based on medication usage, history, and risk factors AAA Screening One time ultrasound Men 47-63 years old who have every smoked Lung Cancer Screening Low Dose Lung CT every 12 months Age 57-80 years with a 30 pack-year smoking history who still smoke or who have quit within the last 15 years  Screening Labs Routine  Labs: Complete Blood Count (CBC), Complete Metabolic Panel (CMP), Cholesterol (Lipid Panel) Every 6-12 months based on history and medications May be recommended more frequently based on current conditions or previous results Hemoglobin A1c Lab Every 3-12 months based on history and previous results Starting at age 25 or earlier with diagnosis of diabetes, high cholesterol, BMI >26, and/or risk factors Frequent monitoring for patients with diabetes to ensure blood sugar control Thyroid Panel (TSH w/ T3 & T4) Every 6 months based on history, symptoms, and risk factors May be repeated more often if on medication HIV One time testing for all patients 8 and older May be repeated more frequently for patients with increased risk factors or exposure Hepatitis C One time testing for all patients 96 and older May be  repeated more frequently for patients with increased risk factors or exposure Gonorrhea, Chlamydia Every 12 months for all sexually active persons 13-24 years Additional monitoring may be recommended for those who are considered high risk or who have symptoms PSA Men 15-104 years old with risk factors Additional screening may be recommended from age 39-69 based on risk factors, symptoms, and history  Vaccine Recommendations Tetanus Booster All adults every 10 years Flu Vaccine All patients 6 months and older every year COVID Vaccine All patients 12 years and older Initial dosing with booster May recommend additional booster based on age and health history HPV Vaccine 2 doses all patients age 10-26 Dosing may be considered for patients over 26 Shingles Vaccine (Shingrix) 2 doses all adults 53 years and older Pneumonia (Pneumovax 23) All adults 17 years and older May recommend earlier dosing based on health history Pneumonia (Prevnar 13) All adults 80 years and older Dosed 1 year after Pneumovax 23  Additional Screening, Testing, and Vaccinations may be recommended on an individualized basis based on family history, health history, risk factors, and/or exposure.

## 2021-11-09 NOTE — Patient Instructions (Addendum)
Fever and body aches: Ibuprofen and Tylenol alternate every 4 hours  Cepacol can also help with the sore throat.    Aquaphor can be very help with vaginal dryness.

## 2021-11-25 ENCOUNTER — Other Ambulatory Visit (HOSPITAL_BASED_OUTPATIENT_CLINIC_OR_DEPARTMENT_OTHER): Payer: Self-pay

## 2021-12-03 ENCOUNTER — Other Ambulatory Visit (HOSPITAL_BASED_OUTPATIENT_CLINIC_OR_DEPARTMENT_OTHER): Payer: Self-pay

## 2021-12-03 ENCOUNTER — Encounter (HOSPITAL_BASED_OUTPATIENT_CLINIC_OR_DEPARTMENT_OTHER): Payer: Self-pay | Admitting: Nurse Practitioner

## 2021-12-03 ENCOUNTER — Ambulatory Visit (INDEPENDENT_AMBULATORY_CARE_PROVIDER_SITE_OTHER): Payer: Medicare (Managed Care) | Admitting: Nurse Practitioner

## 2021-12-03 VITALS — BP 178/106

## 2021-12-03 DIAGNOSIS — F439 Reaction to severe stress, unspecified: Secondary | ICD-10-CM

## 2021-12-03 DIAGNOSIS — R635 Abnormal weight gain: Secondary | ICD-10-CM

## 2021-12-03 DIAGNOSIS — I10 Essential (primary) hypertension: Secondary | ICD-10-CM

## 2021-12-03 MED ORDER — VERAPAMIL HCL ER 240 MG PO TBCR
240.0000 mg | EXTENDED_RELEASE_TABLET | Freq: Every day | ORAL | 0 refills | Status: DC
  Filled 2021-12-03: qty 10, 10d supply, fill #0
  Filled 2021-12-04: qty 20, 20d supply, fill #0
  Filled 2021-12-29: qty 30, 30d supply, fill #0

## 2021-12-03 MED ORDER — PHENTERMINE HCL 15 MG PO CAPS
15.0000 mg | ORAL_CAPSULE | ORAL | 0 refills | Status: DC
  Filled 2021-12-03 – 2022-01-25 (×2): qty 180, 90d supply, fill #0

## 2021-12-03 NOTE — Progress Notes (Addendum)
Virtual Visit Encounter telephone visit.   I connected with  [name] on 02/05/22 at  4:10 PM EDT by secure audio telemedicine application. I verified that I am speaking with the correct person using two identifiers.   I introduced myself as a Designer, jewellery with the practice. The limitations of evaluation and management by telemedicine discussed with the patient and the availability of in person appointments. The patient expressed verbal understanding and consent to proceed.  Participating parties in this visit include: Myself and patient  The patient is: Patient Location: Home I am: Provider Location: Office/Clinic Subjective:    CC and HPI: Claudia Elliott is a 56 y.o. year old female presenting for follow up of weight. Patient reports the following: Claudia Elliott's blood pressure is elevated today, so we will also address that.  HTN [name] tells me that she has been exceptionally stressed lately, but the last several times she has taken her blood pressure it has been very elevated so she tells me that she decided to stop taking it because knowing that it was elevated stressed her out more.  She endorses taking her verapamil as prescribed.  She has previously been on amlodipine.  She is not sure why this medication was stopped but she feels that there may have been an interaction with one of her other medications.  She endorses a daily headache, but tells me that this is present even when her blood pressure is normal. She reports this is stress related.  Her weight is steady. She has been taking her phentermine, but has not been working on diet or exercise. She plans to increase her efforts with this.  Past medical history, Surgical history, Family history not pertinant except as noted below, Social history, Allergies, and medications have been entered into the medical record, reviewed, and corrections made.   Review of Systems:  All review of systems negative except what is listed in  the HPI  Objective:    Alert and oriented x 4 Speaking in clear sentences with no shortness of breath. No distress.  Impression and Recommendations:    Problem List Items Addressed This Visit     Primary hypertension    Chronic.  Blood pressures are not well-controlled at this time.  It is possible that her increased stress levels are contributing to elevated blood pressure readings.  At this time she is not interested in medication options as her stress is situational.  We did discuss stress management.  Will plan to increase verapamil.  Recommend close monitoring of blood pressure at home and reporting readings greater than 140/90.  Will plan to follow-up in 4 weeks.      Weight gain, abnormal    She has been taking the phentermine as directed however has not been working on her diet or exercise plan.  We discussed the importance of diet and exercise for weight management and continuation of weight maintenance once the medication is stopped.  We will plan to give 1 more month of phentermine while she works on her diet and exercise plan.  We will not be able to continue if she does not have any weight loss in the next month.      Relevant Medications   phentermine 15 MG capsule   Situational stress - Primary    Situational stress related to interactions with her son.  These have been quite concerning for her and have been contributing to a decline in her overall health.  We discussed the importance of  her health and working to eliminate some of the stresses that she is dealing with.  Average progressive muscle relaxation and deep breathing exercises as well as physical activity to see if this will help.  We will plan to follow-up with blood pressure recheck.       orders and follow up as documented in EMR I discussed the assessment and treatment plan with the patient. The patient was provided an opportunity to ask questions and all were answered. The patient agreed with the plan and  demonstrated an understanding of the instructions.   The patient was advised to call back or seek an in-person evaluation if the symptoms worsen or if the condition fails to improve as anticipated.  Follow-Up: in 3 months  I provided 26 minutes of non-face-to-face interaction with this non face-to-face encounter including intake, same-day documentation, and chart review.   Orma Render, NP , DNP, AGNP-c [facility] [phone] (fax)

## 2021-12-04 ENCOUNTER — Other Ambulatory Visit (HOSPITAL_BASED_OUTPATIENT_CLINIC_OR_DEPARTMENT_OTHER): Payer: Self-pay

## 2021-12-21 ENCOUNTER — Other Ambulatory Visit (HOSPITAL_BASED_OUTPATIENT_CLINIC_OR_DEPARTMENT_OTHER): Payer: Self-pay

## 2021-12-29 ENCOUNTER — Other Ambulatory Visit (HOSPITAL_BASED_OUTPATIENT_CLINIC_OR_DEPARTMENT_OTHER): Payer: Self-pay

## 2022-01-20 ENCOUNTER — Other Ambulatory Visit: Payer: Self-pay | Admitting: Obstetrics & Gynecology

## 2022-01-20 DIAGNOSIS — Z1231 Encounter for screening mammogram for malignant neoplasm of breast: Secondary | ICD-10-CM

## 2022-01-25 ENCOUNTER — Other Ambulatory Visit: Payer: Self-pay

## 2022-01-25 ENCOUNTER — Other Ambulatory Visit (HOSPITAL_BASED_OUTPATIENT_CLINIC_OR_DEPARTMENT_OTHER): Payer: Self-pay

## 2022-01-25 ENCOUNTER — Other Ambulatory Visit (HOSPITAL_BASED_OUTPATIENT_CLINIC_OR_DEPARTMENT_OTHER): Payer: Self-pay | Admitting: Nurse Practitioner

## 2022-01-25 DIAGNOSIS — I10 Essential (primary) hypertension: Secondary | ICD-10-CM

## 2022-01-25 MED ORDER — VERAPAMIL HCL ER 240 MG PO TBCR
240.0000 mg | EXTENDED_RELEASE_TABLET | Freq: Every day | ORAL | 0 refills | Status: DC
  Filled 2022-01-25: qty 30, 30d supply, fill #0

## 2022-01-25 NOTE — Telephone Encounter (Signed)
Pt needed a refill on verampil

## 2022-01-26 ENCOUNTER — Other Ambulatory Visit: Payer: Self-pay

## 2022-01-28 ENCOUNTER — Ambulatory Visit (HOSPITAL_BASED_OUTPATIENT_CLINIC_OR_DEPARTMENT_OTHER): Payer: Medicare (Managed Care) | Admitting: Obstetrics & Gynecology

## 2022-01-29 ENCOUNTER — Other Ambulatory Visit (HOSPITAL_BASED_OUTPATIENT_CLINIC_OR_DEPARTMENT_OTHER): Payer: Self-pay

## 2022-02-05 ENCOUNTER — Encounter (HOSPITAL_BASED_OUTPATIENT_CLINIC_OR_DEPARTMENT_OTHER): Payer: Self-pay | Admitting: Nurse Practitioner

## 2022-02-05 DIAGNOSIS — F439 Reaction to severe stress, unspecified: Secondary | ICD-10-CM | POA: Insufficient documentation

## 2022-02-05 NOTE — Assessment & Plan Note (Signed)
Situational stress related to interactions with her son.  These have been quite concerning for her and have been contributing to a decline in her overall health.  We discussed the importance of her health and working to eliminate some of the stresses that she is dealing with.  Average progressive muscle relaxation and deep breathing exercises as well as physical activity to see if this will help.  We will plan to follow-up with blood pressure recheck.

## 2022-02-05 NOTE — Assessment & Plan Note (Signed)
Chronic.  Blood pressures are not well-controlled at this time.  It is possible that her increased stress levels are contributing to elevated blood pressure readings.  At this time she is not interested in medication options as her stress is situational.  We did discuss stress management.  Will plan to increase verapamil.  Recommend close monitoring of blood pressure at home and reporting readings greater than 140/90.  Will plan to follow-up in 4 weeks.

## 2022-02-05 NOTE — Assessment & Plan Note (Signed)
She has been taking the phentermine as directed however has not been working on her diet or exercise plan.  We discussed the importance of diet and exercise for weight management and continuation of weight maintenance once the medication is stopped.  We will plan to give 1 more month of phentermine while she works on her diet and exercise plan.  We will not be able to continue if she does not have any weight loss in the next month.

## 2022-02-15 ENCOUNTER — Other Ambulatory Visit (HOSPITAL_COMMUNITY)
Admission: RE | Admit: 2022-02-15 | Discharge: 2022-02-15 | Disposition: A | Discharge status: Home / Self Care | Payer: Medicare (Managed Care) | Source: Ambulatory Visit | Attending: Obstetrics & Gynecology | Admitting: Obstetrics & Gynecology

## 2022-02-15 ENCOUNTER — Ambulatory Visit (INDEPENDENT_AMBULATORY_CARE_PROVIDER_SITE_OTHER): Payer: Medicare (Managed Care) | Admitting: Obstetrics & Gynecology

## 2022-02-15 ENCOUNTER — Encounter (HOSPITAL_BASED_OUTPATIENT_CLINIC_OR_DEPARTMENT_OTHER): Payer: Self-pay | Admitting: Obstetrics & Gynecology

## 2022-02-15 VITALS — BP 150/90 | HR 70 | Ht 65.25 in | Wt 165.0 lb

## 2022-02-15 DIAGNOSIS — Z124 Encounter for screening for malignant neoplasm of cervix: Secondary | ICD-10-CM

## 2022-02-15 DIAGNOSIS — N6489 Other specified disorders of breast: Secondary | ICD-10-CM | POA: Diagnosis not present

## 2022-02-15 DIAGNOSIS — N62 Hypertrophy of breast: Secondary | ICD-10-CM

## 2022-02-15 DIAGNOSIS — Z9189 Other specified personal risk factors, not elsewhere classified: Secondary | ICD-10-CM | POA: Diagnosis not present

## 2022-02-15 DIAGNOSIS — I10 Essential (primary) hypertension: Secondary | ICD-10-CM

## 2022-02-15 NOTE — Progress Notes (Unsigned)
57 y.o. [id number] Divorced Black or [place] American female here for breast and pelvic exam.  Denies vaginal bleeding.    Having issues with mid upper back pain.  Feels like her breasts are contributing.  Ice helps.  Heat makes her back feel better.  Left breast is a DD and right a C.  She is using a spanx for uneven breasts.  Ready to consider breast reduction.  If she can lift her breasts, the pain in her mid back improves.  Sports bras to help if they really hold her breasts in tightly.    Has been SA since I saw her last.  Would like pap smear.  Declines STD screening.  Feels "I would know".   Patient's last menstrual period was 06/13/2014 (approximate).          Sexually active: yes The current method of family planning is post menopausal status.    Smoker:  no  Health Maintenance: Pap:  pap with neg HR HPV 01/2021 History of abnormal Pap:  yes, h/o +HR HPV 2016 MMG:  12/06/2020 Colonoscopy:  07/2020.  Has ischemic colitis.   BMD:   plan around age 47 Screening Labs: done in 03/2021 and 05/2021   reports that she has quit smoking. Her smoking use included cigarettes. She has never used smokeless tobacco. She reports current alcohol use of about 2.0 standard drinks of alcohol per week. She reports that she does not use drugs.  Past Medical History:  Diagnosis Date   Abnormal Pap smear    repeat pap   Allergy    Bruising tendency 04/02/2021   COVID-19 03/05/2021   Depression    Edema 04/02/2021   Fibroid    Heart murmur    Irritant dermatitis 10/11/2018   Migraine    Panic attacks    Primary hypertension 03/05/2021   Seizures ([place])    x 3 , last seizure 06/23/14   Strep throat 04/02/2021   Subacute cough 03/20/2021    Past Surgical History:  Procedure Laterality Date   APPENDECTOMY     BREAST BIOPSY Right 2013   BREAST CYST ASPIRATION Right 12/2012   KNEE SURGERY Right 07/19/2017    Current Outpatient Medications  Medication Sig Dispense Refill   acetaminophen  (TYLENOL) 500 MG tablet Take 2 tablets (1,000 mg total) by mouth every 8 (eight) hours as needed. Alternate with ibuprofen. 60 tablet 1   Blood Pressure Monitoring (BLOOD PRESSURE KIT) KIT For monitoring BP. ICD-10 I10 1 kit 0   buPROPion (WELLBUTRIN XL) 300 MG 24 hr tablet Take 300 mg by mouth daily.     busPIRone (BUSPAR) 30 MG tablet Take 60 mg by mouth daily.     DULoxetine (CYMBALTA) 60 MG capsule Take 2 capsules by mouth daily.  3   hydroquinone 4 % cream Apply small pea-sized amount nightly to affected dark spots.     levETIRAcetam (KEPPRA) 1000 MG tablet Take 1 tablet (1,000 mg total) by mouth daily. 90 each 3   levETIRAcetam (KEPPRA) 750 MG tablet Take 1 tablet (750 mg total) by mouth daily. 90 each 3   Multiple Vitamins-Minerals (MULTIVITAMIN GUMMIES ADULT) CHEW Take 2 gummies by mouth daily. 60 tablet 11   NONFORMULARY OR COMPOUNDED ITEM Vitamin E vaginal cream 200u/ml.  One ml two to three times weekly.  Disp: 3 month supply. 36 each 3   ondansetron (ZOFRAN-ODT) 4 MG disintegrating tablet Take 1 tablet (4 mg total) by mouth every 8 (eight) hours as needed for nausea or vomiting.  20 tablet 0   pantoprazole (PROTONIX) 40 MG tablet Take 1 tablet (40 mg total) by mouth daily for three months. 30 tablet 3   phentermine 15 MG capsule Take 1-2 capsules (15-30 mg total) by mouth every morning. 180 capsule 0   Sulfacetamide Sodium, Acne, 10 % LOTN Apply to face each am.     traZODone (DESYREL) 50 MG tablet Take 1 tablet (50 mg total) by mouth at bedtime. 30 tablet 11   tretinoin (RETIN-A) 0.025 % cream daily as needed.     triamcinolone cream (KENALOG) 0.1 % daily as needed.  0   verapamil (CALAN-SR) 240 MG CR tablet Take 1 tablet (240 mg total) by mouth at bedtime. 30 tablet 0   zonisamide (ZONEGRAN) 100 MG capsule Take by mouth.     fluconazole (DIFLUCAN) 150 MG tablet Take one tablet by mouth at the first sign of symptoms of yeast. If no resolution, repeat dose in 72 hours. (Patient not  taking: Reported on 02/15/2022) 2 tablet 2   LORazepam (ATIVAN) 0.5 MG tablet Take 1 tablet (0.5 mg total) by mouth daily as needed for anxiety. (Patient not taking: Reported on 02/15/2022) 30 tablet 2   sucralfate (CARAFATE) 1 g tablet Take 1 tablet (1 g total) by mouth 4 (four) times daily for 1 month, then stop. (Patient not taking: Reported on 02/15/2022) 120 tablet 0   No current facility-administered medications for this visit.    Family History  Problem Relation Age of Onset   Diabetes Maternal Grandmother    Hypertension Maternal Grandmother    Hypertension Mother     ROS: Constitutional: negative Genitourinary:negative  Exam:   BP (!) 150/90   Pulse 70   Ht 5' 5.25" (1.657 m)   Wt 165 lb (74.8 kg)   LMP 06/13/2014 (Approximate)   BMI 27.25 kg/m   Height: 5' 5.25" (165.7 cm)  General appearance: alert, cooperative and appears stated age Head: Normocephalic, without obvious abnormality, atraumatic Neck: no adenopathy, supple, symmetrical, trachea midline and thyroid normal to inspection and palpation Lungs: clear to auscultation bilaterally Breasts: normal appearance, no masses or tenderness, pendulous and asymmetric  Heart: regular rate and rhythm Abdomen: soft, non-tender; bowel sounds normal; no masses,  no organomegaly Extremities: extremities normal, atraumatic, no cyanosis or edema Skin: Skin color, texture, turgor normal. No rashes or lesions Lymph nodes: Cervical, supraclavicular, and axillary nodes normal. No abnormal inguinal nodes palpated Neurologic: Grossly normal  Pelvic: External genitalia:  no lesions              Urethra:  normal appearing urethra with no masses, tenderness or lesions              Bartholins and Skenes: normal                 Vagina: normal appearing vagina with normal color and no discharge, no lesions              Cervix: no lesions              Pap taken: Yes.   Bimanual Exam:  Uterus:  normal size, contour, position, consistency,  mobility, non-tender              Adnexa: normal adnexa and no mass, fullness, tenderness               Rectovaginal: Confirms               Anus:  decliend DRE but small sebaceous cyst noted  at 12 o'clock  Chaperone, Ezekiel Ina, RN, was present for exam.  Assessment/Plan: 1. GYN exam for high-risk Medicare patient - Pap smear neg with neg HR HPV 01/2021.  Pt desires pap smear today.   - Mammogram 12/06/2020 - Colonoscopy 07/2020 with ischemic colitis - Bone mineral density will be planned around age 57 - lab work done with PCP, Clarise Cruz Early - vaccines reviewed/updated  2. Macromastia - Ambulatory referral to Plastic Surgery  3. Asymmetrical breasts  4. Cervical cancer screening - Cytology - PAP( [place])  5. Primary hypertension - is on medication.  Recommended she stop her phentermine due to elevated BP.

## 2022-02-16 LAB — CYTOLOGY - PAP: Diagnosis: NEGATIVE

## 2022-03-16 ENCOUNTER — Ambulatory Visit: Payer: Medicare (Managed Care)

## 2022-03-23 ENCOUNTER — Other Ambulatory Visit: Payer: Self-pay

## 2022-03-23 ENCOUNTER — Other Ambulatory Visit (HOSPITAL_BASED_OUTPATIENT_CLINIC_OR_DEPARTMENT_OTHER): Payer: Self-pay | Admitting: Nurse Practitioner

## 2022-03-23 ENCOUNTER — Other Ambulatory Visit (HOSPITAL_COMMUNITY): Payer: Self-pay

## 2022-03-23 ENCOUNTER — Telehealth: Payer: Self-pay | Admitting: Nurse Practitioner

## 2022-03-23 DIAGNOSIS — I10 Essential (primary) hypertension: Secondary | ICD-10-CM

## 2022-03-23 MED ORDER — VERAPAMIL HCL ER 240 MG PO TBCR
240.0000 mg | EXTENDED_RELEASE_TABLET | Freq: Every day | ORAL | 0 refills | Status: DC
  Filled 2022-03-23: qty 30, 30d supply, fill #0

## 2022-03-23 NOTE — Telephone Encounter (Signed)
Pt called and is requesting a refill on her verapamil please send to the  [facility] long mail order

## 2022-03-29 ENCOUNTER — Other Ambulatory Visit (HOSPITAL_COMMUNITY): Payer: Self-pay

## 2022-03-29 ENCOUNTER — Ambulatory Visit (INDEPENDENT_AMBULATORY_CARE_PROVIDER_SITE_OTHER): Payer: Medicare (Managed Care) | Admitting: Nurse Practitioner

## 2022-03-29 ENCOUNTER — Telehealth: Payer: Self-pay

## 2022-03-29 ENCOUNTER — Encounter: Payer: Self-pay | Admitting: Nurse Practitioner

## 2022-03-29 VITALS — BP 148/92 | HR 114 | Wt 162.2 lb

## 2022-03-29 DIAGNOSIS — G40109 Localization-related (focal) (partial) symptomatic epilepsy and epileptic syndromes with simple partial seizures, not intractable, without status epilepticus: Secondary | ICD-10-CM

## 2022-03-29 DIAGNOSIS — I10 Essential (primary) hypertension: Secondary | ICD-10-CM

## 2022-03-29 DIAGNOSIS — G40309 Generalized idiopathic epilepsy and epileptic syndromes, not intractable, without status epilepticus: Secondary | ICD-10-CM | POA: Diagnosis not present

## 2022-03-29 DIAGNOSIS — K2900 Acute gastritis without bleeding: Secondary | ICD-10-CM

## 2022-03-29 DIAGNOSIS — F411 Generalized anxiety disorder: Secondary | ICD-10-CM

## 2022-03-29 HISTORY — DX: Acute gastritis without bleeding: K29.00

## 2022-03-29 MED ORDER — PANTOPRAZOLE SODIUM 40 MG PO TBEC
40.0000 mg | DELAYED_RELEASE_TABLET | Freq: Every day | ORAL | 3 refills | Status: DC
  Filled 2022-03-29: qty 30, 30d supply, fill #0

## 2022-03-29 MED ORDER — LOPERAMIDE HCL 2 MG PO TABS
2.0000 mg | ORAL_TABLET | Freq: Four times a day (QID) | ORAL | 0 refills | Status: AC | PRN
  Filled 2022-03-29: qty 30, 8d supply, fill #0

## 2022-03-29 MED ORDER — PROMETHAZINE HCL 12.5 MG PO TABS
12.5000 mg | ORAL_TABLET | Freq: Three times a day (TID) | ORAL | 0 refills | Status: DC | PRN
  Filled 2022-03-29: qty 20, 7d supply, fill #0

## 2022-03-29 NOTE — Patient Instructions (Signed)
I will let you know what your labs show. If we need to change anything, I will let you know.   Let me know if this does not get better.   Stop the phentermine.  Stop alcohol intake.

## 2022-03-29 NOTE — Assessment & Plan Note (Signed)
Symptoms include vomiting and diarrhea following a recent COVID-19 infection. Recent diagnosis of gastritis with similar symptom onset. Suspect that her symptoms could be related to pancreatitis given her increased alcohol consumption reported recently.  Plan: - Advise a liquid diet for one day, then progress to the BRAT diet as tolerated. - Encourage adequate fluid intake to maintain hydration. - Prescribe Phenergan for nausea and vomiting, and loperamide for diarrhea. - Restart Pantoprazole for gastritis management. - No alcohol consumption at this time, given severity of symptoms.

## 2022-03-29 NOTE — Progress Notes (Signed)
Orma Render, DNP, AGNP-c [facility] [address] [place], [place] [address] [phone]  Subjective:   Claudia Elliott is a 57 y.o. female presents to day for evaluation of: Vomiting and Diarrhea Claudia Elliott presents today with a chief complaint of persistent vomiting, diarrhea, and sore throat. She reports that these symptoms began following a positive COVID-19 test approximately 10 days ago, with the sore throat persisting since the onset of the virus. She confirms that she has not had a fever during this time.  She details the progression of her gastrointestinal symptoms, noting the onset of diarrhea four days ago and vomiting beginning the subsequent day. Claudia Elliott indicates that she has experienced more frequent episodes of diarrhea, with three occurrences today, and mentions that she vomited yesterday. She also reports noticing blood in her rectum, although not in her stool.  Claudia Elliott expresses concern regarding her history of seizures, particularly her ability to retain her seizure medication in her stomach, Keppra, amidst her current symptoms. She tells me she has had three seizures since her symptoms started.  She also references a past hospitalization for gastritis and her previous use of Phenergan and Pantoprazole to manage her gastrointestinal issues.  In discussing her lifestyle, Claudia Elliott acknowledges her recent abstinence from alcohol for the past two weeks and recognizes the importance of reducing her alcohol consumption due to its potential adverse effects on her health, including the risk of stomach cancer and exacerbation of her seizure condition.  Claudia Elliott provides an overview of her current medication regimen, which includes verapamil, TriCor, Trazodone, Cymbalta, Wellbutrin, bupropion, buspirone, duloxetine, and fentanyl. She also mentions taking Phentermine for weight management but has been advised to discontinue its use in light of her elevated blood  pressure.  PMH, Medications, and Allergies reviewed and updated in chart as appropriate.   ROS negative except for what is listed in HPI. Objective:  BP (!) 148/92   Pulse (!) 114   Wt 162 lb 3.2 oz (73.6 kg)   LMP 06/13/2014 (Approximate)   BMI 26.79 kg/m  Physical Exam Vitals and nursing note reviewed.  Constitutional:      General: She is not in acute distress.    Appearance: Normal appearance. She is ill-appearing.  HENT:     Head: Normocephalic.  Eyes:     Pupils: Pupils are equal, round, and reactive to light.  Neck:     Vascular: No carotid bruit.  Cardiovascular:     Rate and Rhythm: Normal rate and regular rhythm.     Pulses: Normal pulses.     Heart sounds: Normal heart sounds. No murmur heard. Pulmonary:     Effort: Pulmonary effort is normal.     Breath sounds: Normal breath sounds. No wheezing.  Abdominal:     General: Bowel sounds are increased. There is no distension.     Palpations: Abdomen is soft. There is no hepatomegaly or splenomegaly.     Tenderness: There is abdominal tenderness in the epigastric area. There is no guarding.  Musculoskeletal:        General: Normal range of motion.     Cervical back: Normal range of motion and neck supple.     Right lower leg: No edema.     Left lower leg: No edema.  Lymphadenopathy:     Cervical: No cervical adenopathy.  Skin:    General: Skin is warm and dry.     Capillary Refill: Capillary refill takes less than 2 seconds.  Neurological:     General: No  focal deficit present.     Mental Status: She is alert and oriented to person, place, and time.  Psychiatric:        Mood and Affect: Mood normal.           Assessment & Plan:   Problem List Items Addressed This Visit     Focal epilepsy (Claudia Elliott)    Reports of three recent seizures with uncertainty about medication retention. At this time no alarm symptoms are present. Unclear if seizures have any relation to cessation of alcohol intake with illness. She  is not forthcoming today about the amount of alcohol she has been consuming. She reassures me she is no longer drinking and has not had a drink in 2 weeks.  Plan: - Ensure continued use of Keppra as prescribed. - Monitor seizure frequency and medication adherence. - contact neurology for recommendations if this continues.  - no alcohol consumption      Relevant Orders   CBC with Differential/Platelet   Comprehensive metabolic panel   Iron, TIBC and Ferritin Panel   TSH   B12 and Folate Panel   VITAMIN D 25 Hydroxy (Vit-D Deficiency, Fractures)   Hemoglobin A1c   Lipase   Primary hypertension    Elevated blood pressure with a history of high blood pressure and headaches. At this time it is not clear how well controlled her BP is. I am concerned that the low dose phentermine has been contributing to her symptoms.  Plan: - Discontinue Phentermine due to potential effects on blood pressure. - Encourage weight loss and exercise for blood pressure management. - Continue Verapamil as prescribed. - Regular blood pressure monitoring.      Relevant Orders   CBC with Differential/Platelet   Comprehensive metabolic panel   Iron, TIBC and Ferritin Panel   TSH   B12 and Folate Panel   VITAMIN D 25 Hydroxy (Vit-D Deficiency, Fractures)   Hemoglobin A1c   Lipase   Acute gastritis without hemorrhage - Primary    Symptoms include vomiting and diarrhea following a recent COVID-19 infection. Recent diagnosis of gastritis with similar symptom onset. Suspect that her symptoms could be related to pancreatitis given her increased alcohol consumption reported recently.  Plan: - Advise a liquid diet for one day, then progress to the BRAT diet as tolerated. - Encourage adequate fluid intake to maintain hydration. - Prescribe Phenergan for nausea and vomiting, and loperamide for diarrhea. - Restart Pantoprazole for gastritis management. - No alcohol consumption at this time, given severity of symptoms.        Relevant Medications   pantoprazole (PROTONIX) 40 MG tablet   loperamide (IMODIUM A-D) 2 MG tablet   promethazine (PHENERGAN) 12.5 MG tablet   Other Relevant Orders   CBC with Differential/Platelet   Comprehensive metabolic panel   Iron, TIBC and Ferritin Panel   TSH   B12 and Folate Panel   VITAMIN D 25 Hydroxy (Vit-D Deficiency, Fractures)   Hemoglobin A1c   Lipase   Generalized anxiety disorder   Relevant Orders   CBC with Differential/Platelet   Comprehensive metabolic panel   Iron, TIBC and Ferritin Panel   TSH   B12 and Folate Panel   VITAMIN D 25 Hydroxy (Vit-D Deficiency, Fractures)   Hemoglobin A1c   Lipase   Epilepsy, generalized, convulsive ([place])   Relevant Orders   CBC with Differential/Platelet   Comprehensive metabolic panel   Iron, TIBC and Ferritin Panel   TSH   B12 and Folate Panel   VITAMIN  D 25 Hydroxy (Vit-D Deficiency, Fractures)   Hemoglobin A1c   Lipase    Time: 45 minutes, >50% spent counseling, care coordination, chart review, and documentation.    Orma Render, DNP, AGNP-c 03/29/2022  7:42 PM    History, Medications, Surgery, SDOH, and Family History reviewed and updated as appropriate.

## 2022-03-29 NOTE — Assessment & Plan Note (Signed)
Elevated blood pressure with a history of high blood pressure and headaches. At this time it is not clear how well controlled her BP is. I am concerned that the low dose phentermine has been contributing to her symptoms.  Plan: - Discontinue Phentermine due to potential effects on blood pressure. - Encourage weight loss and exercise for blood pressure management. - Continue Verapamil as prescribed. - Regular blood pressure monitoring.

## 2022-03-29 NOTE — Telephone Encounter (Signed)
When pt. Was leaving she asked if you could write her out last week from when she had covid.

## 2022-03-29 NOTE — Assessment & Plan Note (Signed)
Reports of three recent seizures with uncertainty about medication retention. At this time no alarm symptoms are present. Unclear if seizures have any relation to cessation of alcohol intake with illness. She is not forthcoming today about the amount of alcohol she has been consuming. She reassures me she is no longer drinking and has not had a drink in 2 weeks.  Plan: - Ensure continued use of Keppra as prescribed. - Monitor seizure frequency and medication adherence. - contact neurology for recommendations if this continues.  - no alcohol consumption

## 2022-03-30 LAB — IRON,TIBC AND FERRITIN PANEL
Ferritin: 107 ng/mL (ref 15–150)
Iron Saturation: 24 % (ref 15–55)
Iron: 77 ug/dL (ref 27–159)
Total Iron Binding Capacity: 318 ug/dL (ref 250–450)
UIBC: 241 ug/dL (ref 131–425)

## 2022-03-30 LAB — COMPREHENSIVE METABOLIC PANEL
ALT: 15 IU/L (ref 0–32)
AST: 25 IU/L (ref 0–40)
Albumin/Globulin Ratio: 2.2 (ref 1.2–2.2)
Albumin: 4.6 g/dL (ref 3.8–4.9)
Alkaline Phosphatase: 71 IU/L (ref 44–121)
BUN/Creatinine Ratio: 15 (ref 9–23)
BUN: 16 mg/dL (ref 6–24)
Bilirubin Total: 0.5 mg/dL (ref 0.0–1.2)
CO2: 23 mmol/L (ref 20–29)
Calcium: 10.3 mg/dL — ABNORMAL HIGH (ref 8.7–10.2)
Chloride: 101 mmol/L (ref 96–106)
Creatinine, Ser: 1.06 mg/dL — ABNORMAL HIGH (ref 0.57–1.00)
Globulin, Total: 2.1 g/dL (ref 1.5–4.5)
Glucose: 89 mg/dL (ref 70–99)
Potassium: 4 mmol/L (ref 3.5–5.2)
Sodium: 139 mmol/L (ref 134–144)
Total Protein: 6.7 g/dL (ref 6.0–8.5)
eGFR: 61 mL/min/{1.73_m2} (ref >59)

## 2022-03-30 LAB — CBC WITH DIFFERENTIAL/PLATELET
Basophils Absolute: 0.1 10*3/uL (ref 0.0–0.2)
Basos: 2 %
EOS (ABSOLUTE): 0.2 10*3/uL (ref 0.0–0.4)
Eos: 5 %
Hematocrit: 43.9 % (ref 34.0–46.6)
Hemoglobin: 15.3 g/dL (ref 11.1–15.9)
Immature Grans (Abs): 0 10*3/uL (ref 0.0–0.1)
Immature Granulocytes: 0 %
Lymphocytes Absolute: 1.3 10*3/uL (ref 0.7–3.1)
Lymphs: 33 %
MCH: 31.5 pg (ref 26.6–33.0)
MCHC: 34.9 g/dL (ref 31.5–35.7)
MCV: 90 fL (ref 79–97)
Monocytes Absolute: 0.4 10*3/uL (ref 0.1–0.9)
Monocytes: 11 %
Neutrophils Absolute: 2 10*3/uL (ref 1.4–7.0)
Neutrophils: 49 %
Platelets: 414 10*3/uL (ref 150–450)
RBC: 4.86 x10E6/uL (ref 3.77–5.28)
RDW: 12.3 % (ref 11.7–15.4)
WBC: 4 10*3/uL (ref 3.4–10.8)

## 2022-03-30 LAB — B12 AND FOLATE PANEL
Folate: 11.5 ng/mL (ref >3.0)
Vitamin B-12: 764 pg/mL (ref 232–1245)

## 2022-03-30 LAB — HEMOGLOBIN A1C
Est. average glucose Bld gHb Est-mCnc: 105 mg/dL
Hgb A1c MFr Bld: 5.3 % (ref 4.8–5.6)

## 2022-03-30 LAB — LIPASE: Lipase: 38 U/L (ref 14–72)

## 2022-03-30 LAB — VITAMIN D 25 HYDROXY (VIT D DEFICIENCY, FRACTURES): Vit D, 25-Hydroxy: 47.7 ng/mL (ref 30.0–100.0)

## 2022-03-30 LAB — TSH: TSH: 0.678 u[IU]/mL (ref 0.450–4.500)

## 2022-04-09 ENCOUNTER — Ambulatory Visit (INDEPENDENT_AMBULATORY_CARE_PROVIDER_SITE_OTHER): Payer: Medicare (Managed Care) | Admitting: Plastic Surgery

## 2022-04-09 ENCOUNTER — Encounter: Payer: Self-pay | Admitting: Plastic Surgery

## 2022-04-09 VITALS — BP 137/82 | HR 87 | Ht 66.5 in | Wt 168.0 lb

## 2022-04-09 DIAGNOSIS — F411 Generalized anxiety disorder: Secondary | ICD-10-CM

## 2022-04-09 DIAGNOSIS — N6489 Other specified disorders of breast: Secondary | ICD-10-CM | POA: Insufficient documentation

## 2022-04-09 DIAGNOSIS — M549 Dorsalgia, unspecified: Secondary | ICD-10-CM | POA: Insufficient documentation

## 2022-04-09 DIAGNOSIS — Z6827 Body mass index (BMI) 27.0-27.9, adult: Secondary | ICD-10-CM

## 2022-04-09 DIAGNOSIS — G8929 Other chronic pain: Secondary | ICD-10-CM

## 2022-04-09 DIAGNOSIS — M546 Pain in thoracic spine: Secondary | ICD-10-CM

## 2022-04-09 DIAGNOSIS — M542 Cervicalgia: Secondary | ICD-10-CM | POA: Diagnosis not present

## 2022-04-09 DIAGNOSIS — N62 Hypertrophy of breast: Secondary | ICD-10-CM | POA: Diagnosis not present

## 2022-04-09 DIAGNOSIS — I10 Essential (primary) hypertension: Secondary | ICD-10-CM

## 2022-04-09 DIAGNOSIS — G40309 Generalized idiopathic epilepsy and epileptic syndromes, not intractable, without status epilepticus: Secondary | ICD-10-CM

## 2022-04-09 HISTORY — DX: Other specified disorders of breast: N64.89

## 2022-04-09 NOTE — Progress Notes (Signed)
Patient ID: Claudia Elliott, female    DOB: 06/04/1965, 57 y.o.   MRN: [id number]   Chief Complaint  Patient presents with   Advice Only    Mammary Hyperplasia: The patient is a 57 y.o. female with a history of mammary hyperplasia for several years.  She has extremely large breasts causing symptoms that include the following: Back pain in the upper and lower back, including neck pain. She pulls or pins her bra straps to provide better lift and relief of the pressure and pain. She notices relief by holding her breast up manually.  Her shoulder straps cause grooves and pain and pressure that requires padding for relief. Pain medication is sometimes required with motrin and tylenol.   She has tried supportive clothing as well as fitted bras without improvement.  Her breasts are at least a cup size different.  She has hyperpigmentation of the inframammary area on both sides.  The sternal to nipple distance on the right is 22 cm and the left is 23 cm.  The IMF distance is 15 cm.  She is 5 feet 6 inches tall and weighs 168 pounds.  The BMI = 27.1 kg/m.  Preoperative bra size = C on right and DD cup on left.  The estimated excess breast tissue to be removed at the time of surgery = 250 grams on the left and 100 grams on the right.  Mammogram history: due 04/28/22.  Previous MM negative.  Family history of breast cancer:  none.  Tobacco use: None and she is not diabetic.  She did have a biopsy of her breast 5 years ago but it was negative. The patient expresses the desire to pursue surgical intervention.  [place] history is positive for hypertension, seizures, panic attacks and migraines.  She has had an appendectomy, a breast biopsy and knee surgery in the past.    Review of Systems  Constitutional:  Positive for activity change. Negative for appetite change.  Eyes: Negative.   Respiratory: Negative.    Cardiovascular: Negative.  Negative for leg swelling.  Endocrine: Negative.   Genitourinary:  Negative.   Musculoskeletal:  Positive for back pain and neck pain.  Hematological: Negative.     Past Medical History:  Diagnosis Date   Abnormal Pap smear    repeat pap   Allergy    Bruising tendency 04/02/2021   COVID-19 03/05/2021   Depression    Edema 04/02/2021   Fibroid    Heart murmur    Irritant dermatitis 10/11/2018   Migraine    Panic attacks    Primary hypertension 03/05/2021   Seizures ([place])    x 3 , last seizure 06/23/14   Strep throat 04/02/2021   Subacute cough 03/20/2021    Past Surgical History:  Procedure Laterality Date   APPENDECTOMY     BREAST BIOPSY Right 2013   BREAST CYST ASPIRATION Right 12/2012   KNEE SURGERY Right 07/19/2017      Current Outpatient Medications:    acetaminophen (TYLENOL) 500 MG tablet, Take 2 tablets (1,000 mg total) by mouth every 8 (eight) hours as needed. Alternate with ibuprofen., Disp: 60 tablet, Rfl: 1   Blood Pressure Monitoring (BLOOD PRESSURE KIT) KIT, For monitoring BP. ICD-10 I10, Disp: 1 kit, Rfl: 0   buPROPion (WELLBUTRIN XL) 300 MG 24 hr tablet, Take 300 mg by mouth daily., Disp: , Rfl:    busPIRone (BUSPAR) 30 MG tablet, Take 60 mg by mouth daily., Disp: , Rfl:  DULoxetine (CYMBALTA) 60 MG capsule, Take 2 capsules by mouth daily., Disp: , Rfl: 3   hydroquinone 4 % cream, Apply small pea-sized amount nightly to affected dark spots., Disp: , Rfl:    levETIRAcetam (KEPPRA) 1000 MG tablet, Take 1 tablet (1,000 mg total) by mouth daily., Disp: 90 each, Rfl: 3   levETIRAcetam (KEPPRA) 750 MG tablet, Take 1 tablet (750 mg total) by mouth daily., Disp: 90 each, Rfl: 3   loperamide (IMODIUM A-D) 2 MG tablet, Take 1 tablet (2 mg total) by mouth 4 (four) times daily as needed for diarrhea or loose stools., Disp: 30 tablet, Rfl: 0   Multiple Vitamins-Minerals (MULTIVITAMIN GUMMIES ADULT) CHEW, Take 2 gummies by mouth daily., Disp: 60 tablet, Rfl: 11   NONFORMULARY OR COMPOUNDED ITEM, Vitamin E vaginal cream 200u/ml.  One ml  two to three times weekly.  Disp: 3 month supply., Disp: 36 each, Rfl: 3   ondansetron (ZOFRAN-ODT) 4 MG disintegrating tablet, Take 1 tablet (4 mg total) by mouth every 8 (eight) hours as needed for nausea or vomiting., Disp: 20 tablet, Rfl: 0   pantoprazole (PROTONIX) 40 MG tablet, Take 1 tablet (40 mg total) by mouth daily., Disp: 30 tablet, Rfl: 3   promethazine (PHENERGAN) 12.5 MG tablet, Take 1 tablet (12.5 mg total) by mouth every 8 (eight) hours as needed for nausea or vomiting., Disp: 20 tablet, Rfl: 0   Sulfacetamide Sodium, Acne, 10 % LOTN, Apply to face each am., Disp: , Rfl:    traZODone (DESYREL) 50 MG tablet, Take 1 tablet (50 mg total) by mouth at bedtime., Disp: 30 tablet, Rfl: 11   tretinoin (RETIN-A) 0.025 % cream, daily as needed., Disp: , Rfl:    triamcinolone cream (KENALOG) 0.1 %, daily as needed., Disp: , Rfl: 0   verapamil (CALAN-SR) 240 MG CR tablet, Take 1 tablet (240 mg total) by mouth at bedtime., Disp: 30 tablet, Rfl: 0   zonisamide (ZONEGRAN) 100 MG capsule, Take by mouth., Disp: , Rfl:    Objective:   Vitals:   04/09/22 1111  BP: 137/82  Pulse: 87  SpO2: 100%    Physical Exam Vitals and nursing note reviewed.  Constitutional:      Appearance: Normal appearance.  HENT:     Head: Normocephalic and atraumatic.  Cardiovascular:     Rate and Rhythm: Normal rate.     Pulses: Normal pulses.  Pulmonary:     Effort: Pulmonary effort is normal.  Abdominal:     Palpations: Abdomen is soft.  Musculoskeletal:        General: No swelling or deformity.  Skin:    General: Skin is warm.     Capillary Refill: Capillary refill takes less than 2 seconds.  Neurological:     Mental Status: She is alert and oriented to person, place, and time.  Psychiatric:        Mood and Affect: Mood normal.        Behavior: Behavior normal.        Thought Content: Thought content normal.        Judgment: Judgment normal.     Assessment & Plan:  Generalized anxiety  disorder  Epilepsy, generalized, convulsive ([place])  Primary hypertension  Chronic bilateral thoracic back pain  Neck pain  Breast asymmetry in female  Symptomatic mammary hypertrophy  The procedure the patient selected and that was best for the patient was discussed. The risk were discussed and include but not limited to the following:  Breast asymmetry, fluid  accumulation, firmness of the breast, inability to breast feed, loss of nipple or areola, skin loss, change in skin and nipple sensation, fat necrosis of the breast tissue, bleeding, infection and healing delay.  There are risks of anesthesia and injury to nerves or blood vessels.  Allergic reaction to tape, suture and skin glue are possible.  There will be swelling.  Any of these can lead to the need for revisional surgery.  A breast reduction has potential to interfere with diagnostic procedures in the future.  This procedure is best done when the breast is fully developed.  Changes in the breast will continue to occur over time: pregnancy, weight gain or weigh loss.    Total time: 40 minutes. This includes time spent with the patient during the visit as well as time spent before and after the visit reviewing the chart, documenting the encounter, ordering pertinent studies and literature for the patient.    The patient's main concern is for symmetry.  This would mean a reduction of the left with a lift on both sides.  We will submit to insurance and see what happens.  Pictures were obtained of the patient and placed in the chart with the patient's or guardian's permission.   Mitchell, DO

## 2022-04-28 ENCOUNTER — Ambulatory Visit: Payer: Medicare (Managed Care)

## 2022-05-05 ENCOUNTER — Telehealth: Payer: Self-pay | Admitting: Plastic Surgery

## 2022-05-05 NOTE — Telephone Encounter (Signed)
Pending Ref# [id number] for [employer] for surgical procedure.

## 2022-05-24 ENCOUNTER — Other Ambulatory Visit: Payer: Self-pay | Admitting: Nurse Practitioner

## 2022-05-24 ENCOUNTER — Other Ambulatory Visit (HOSPITAL_COMMUNITY): Payer: Self-pay

## 2022-05-24 DIAGNOSIS — I10 Essential (primary) hypertension: Secondary | ICD-10-CM

## 2022-05-25 ENCOUNTER — Other Ambulatory Visit (HOSPITAL_COMMUNITY): Payer: Self-pay

## 2022-05-25 MED ORDER — VERAPAMIL HCL ER 240 MG PO TBCR
240.0000 mg | EXTENDED_RELEASE_TABLET | Freq: Every day | ORAL | 5 refills | Status: DC
  Filled 2022-05-25: qty 30, 30d supply, fill #0
  Filled 2022-07-06: qty 30, 30d supply, fill #1
  Filled 2022-09-17: qty 30, 30d supply, fill #2

## 2022-05-26 ENCOUNTER — Telehealth: Payer: Self-pay | Admitting: Plastic Surgery

## 2022-05-26 NOTE — Telephone Encounter (Signed)
Notified pt of Insurance approval Ref# [id number] good from 05/05/22 to 08/05/22.  Approval given to me by phone call ref# [id number].  She was unable to fax me copy of approval.  Pt stated thank you for call and understand scheduler to be calling, she did state she may get a second opinion not wanting to be cut up the middle of the breast.

## 2022-05-27 ENCOUNTER — Other Ambulatory Visit (HOSPITAL_COMMUNITY): Payer: Self-pay

## 2022-06-04 ENCOUNTER — Telehealth: Payer: Self-pay | Admitting: *Deleted

## 2022-06-04 NOTE — Telephone Encounter (Signed)
Patient to call back when ready to schedule says she sick

## 2022-06-10 ENCOUNTER — Telehealth (INDEPENDENT_AMBULATORY_CARE_PROVIDER_SITE_OTHER): Payer: Medicare (Managed Care) | Admitting: Medical

## 2022-06-10 ENCOUNTER — Telehealth: Payer: Self-pay | Admitting: Nurse Practitioner

## 2022-06-10 ENCOUNTER — Encounter: Payer: Self-pay | Admitting: Medical

## 2022-06-10 VITALS — Temp 98.7°F | Wt 160.0 lb

## 2022-06-10 DIAGNOSIS — G43809 Other migraine, not intractable, without status migrainosus: Secondary | ICD-10-CM

## 2022-06-10 DIAGNOSIS — G40309 Generalized idiopathic epilepsy and epileptic syndromes, not intractable, without status epilepticus: Secondary | ICD-10-CM | POA: Diagnosis not present

## 2022-06-10 DIAGNOSIS — F411 Generalized anxiety disorder: Secondary | ICD-10-CM

## 2022-06-10 DIAGNOSIS — Z79899 Other long term (current) drug therapy: Secondary | ICD-10-CM

## 2022-06-10 DIAGNOSIS — I10 Essential (primary) hypertension: Secondary | ICD-10-CM

## 2022-06-10 NOTE — Telephone Encounter (Signed)
Contacted Claudia Elliott to schedule their annual wellness visit. Call back at later date: 06/22/22  Rudell Cobb AWV direct phone # [phone]   Spoke with patient to schedule AWV.  Patient stated she just got over having seizure  I transferred to PFM to have someone talk to her.   I will call next week to schedule AWV

## 2022-06-10 NOTE — Progress Notes (Signed)
This visit type was conducted due to national recommendations for restrictions regarding the COVID-19 Pandemic (e.g. social distancing) in an effort to limit this patient's exposure and mitigate transmission in our community.  Due to their co-morbid illnesses, this patient is at least at moderate risk for complications without adequate follow up.  This format is felt to be most appropriate for this patient at this time.    Documentation for virtual audio and video telecommunications through [facility] encounter:  The patient was located at home. The provider was located in the office. The patient did consent to this visit and is aware of possible charges through their insurance for this visit.  The other persons participating in this telemedicine service were none. Time spent on call was 20 minutes and in review of previous records >30 minutes total.  This virtual service is not related to other E/M service within previous 7 days.    Subjective:  Claudia Elliott is a 57 y.o. female who presents for Chief Complaint  Patient presents with   Seizures    Seizures 2 nights ago. Happens when under stress. Feels she will have a seizure every 3 months.      Virtual for seizure concern.  This is my first visit with her.  She normally sees Enid Skeens, NP here for PCP. She also sees neurology and psychiatry.  She notes a history of seizures since early age.  Seizures tend to be at night.  She knows that stress tends to be a big trigger for her.  She is averaging a seizure every 3 to 4 months but she had a seizure 2 nights ago.  She always feels horrible the next few days after a seizure.  She would like a work note out of work for a few days to recover from the recent seizure.  That is her major concern today.  She currently is on Keppra 1750 mg at night only not twice daily dosing.  Original neurologist was Dr. Baldemar Friday, years ago, before he retired.  But now has been seeing [facility], but was  passed around to different providers for a while.   Last neurologist was Dr. Jose Persia.   Having a lot of stress lately.    Mother is in late stages of Alzheimers.  Has part time job but that has been giving her more stress.   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  She has been seeing the same counselor for many years.  She takes Wellbutrin XL 300 mg daily, BuSpar 30 mg in the morning and 1/2 tablet of the times a day if needed, also on duloxetine 60 mg daily.  She feels like she needs additional help only handling stress.  She does confide in and talk to her pastor but her pastor unfortunately was just recently was diagnosed with cancer.  She has a visit with neurology again in 4 days.  She disclosed towards the end of the visit that she does take phentermine to help with energy.  In recent months she just feels like she has no energy.  This medicine is meant to help boost her energy to get out and do some things.  No other aggravating or relieving factors.    No other c/o.  Past Medical History:  Diagnosis Date   Abnormal Pap smear    repeat pap   Allergy    Bruising  tendency 04/02/2021   COVID-19 03/05/2021   Depression    Edema 04/02/2021   Fibroid    Heart murmur    Irritant dermatitis 10/11/2018   Migraine    Panic attacks    Primary hypertension 03/05/2021   Seizures (HCC)    x 3 , last seizure 06/23/14   Strep throat 04/02/2021   Subacute cough 03/20/2021   Current Outpatient Medications on File Prior to Visit  Medication Sig Dispense Refill   acetaminophen (TYLENOL) 500 MG tablet Take 2 tablets (1,000 mg total) by mouth every 8 (eight) hours as needed. Alternate with ibuprofen. 60 tablet 1   buPROPion (WELLBUTRIN XL) 300 MG 24 hr tablet Take 300 mg by mouth daily.     busPIRone (BUSPAR) 30 MG tablet Take 60 mg by mouth daily.     DULoxetine (CYMBALTA) 60 MG  capsule Take 2 capsules by mouth daily.  3   hydroquinone 4 % cream Apply small pea-sized amount nightly to affected dark spots.     levETIRAcetam (KEPPRA) 1000 MG tablet Take 1 tablet (1,000 mg total) by mouth daily. 90 each 3   levETIRAcetam (KEPPRA) 750 MG tablet Take 1 tablet (750 mg total) by mouth daily. 90 each 3   loperamide (IMODIUM A-D) 2 MG tablet Take 1 tablet (2 mg total) by mouth 4 (four) times daily as needed for diarrhea or loose stools. 30 tablet 0   Multiple Vitamins-Minerals (MULTIVITAMIN GUMMIES ADULT) CHEW Take 2 gummies by mouth daily. 60 tablet 11   pantoprazole (PROTONIX) 40 MG tablet Take 1 tablet (40 mg total) by mouth daily. 30 tablet 3   Sulfacetamide Sodium, Acne, 10 % LOTN Apply to face each am.     traZODone (DESYREL) 50 MG tablet Take 1 tablet (50 mg total) by mouth at bedtime. 30 tablet 11   verapamil (CALAN-SR) 240 MG CR tablet Take 1 tablet (240 mg total) by mouth at bedtime. 30 tablet 5   zonisamide (ZONEGRAN) 100 MG capsule Take by mouth.     Blood Pressure Monitoring (BLOOD PRESSURE KIT) KIT For monitoring BP. ICD-10 I10 1 kit 0   NONFORMULARY OR COMPOUNDED ITEM Vitamin E vaginal cream 200u/ml.  One ml two to three times weekly.  Disp: 3 month supply. 36 each 3   ondansetron (ZOFRAN-ODT) 4 MG disintegrating tablet Take 1 tablet (4 mg total) by mouth every 8 (eight) hours as needed for nausea or vomiting. 20 tablet 0   No current facility-administered medications on file prior to visit.     The following portions of the patient's history were reviewed and updated as appropriate: allergies, current medications, past family history, past medical history, past social history, past surgical history and problem list.  ROS Otherwise as in subjective above  Objective: Temp 98.7 F (37.1 C)   Wt 160 lb (72.6 kg)   LMP 06/13/2014 (Approximate)   BMI 25.44 kg/m   General appearance: alert, no distress, well developed, well nourished She seems to be in pain  or not feeling well at times in interview Answers questions appropriate, good eye contact   Assessment: Encounter Diagnoses  Name Primary?   Epilepsy, generalized, convulsive (HCC) Yes   Primary hypertension    Other migraine without status migrainosus, not intractable    Generalized anxiety disorder    High risk medication use      Plan: We discussed symptoms and concerns.  Note given for work to recover from the recent seizure  We discussed her overall concerns, overall medications.  We discussed that some of her medications are either taken a little different than the average regimen or in some cases doses may be higher than what we typically see for example with the BuSpar the dose is a little higher than average dose.  Typically we see Keppra twice daily versus the way she is taking it.  I advise I am certainly not an expert at psychotropic medicines or seizure medication or treatment  She feels like she needs more consistency with her neurology office.  We discussed reducing stress where possible since stress is a trigger for her  I advise she asked for med review with both her psychiatrist and neurologist.  Additionally I will make referral to neuropsychiatry for further consult or another opinion  Of note she has been taking phentermine to help with energy levels.  I reviewed labs she had here with her PCP back in February.  Labs are all okay except for slightly elevated calcium.  At that time she had a panel of labs including comprehensive metabolic panel, CBC, thyroid, Z61 and folate, vitamin D, hemoglobin A1c and others.    Findings at that time included slightly elevated calcium, creatinine 1.06, but all the other labs look pretty good  I recommended she not take phentermine and talk with her other specialist about ways to help improve her energy levels in light of her other medications.  We discussed the potential risks of phentermine  Britt was seen today for  seizures.  Diagnoses and all orders for this visit:  Epilepsy, generalized, convulsive (HCC) -     Ambulatory referral to Neuropsychology  Primary hypertension  Other migraine without status migrainosus, not intractable  Generalized anxiety disorder -     Ambulatory referral to Neuropsychology  High risk medication use -     Ambulatory referral to Neuropsychology    Follow up: with neurology Monday in 4 days

## 2022-06-14 ENCOUNTER — Ambulatory Visit
Admission: RE | Admit: 2022-06-14 | Discharge: 2022-06-14 | Disposition: A | Discharge status: Home / Self Care | Payer: Medicare (Managed Care) | Source: Ambulatory Visit | Attending: Obstetrics & Gynecology | Admitting: Obstetrics & Gynecology

## 2022-06-14 ENCOUNTER — Ambulatory Visit: Payer: Medicare (Managed Care)

## 2022-06-14 DIAGNOSIS — Z1231 Encounter for screening mammogram for malignant neoplasm of breast: Secondary | ICD-10-CM

## 2022-06-22 ENCOUNTER — Telehealth: Payer: Self-pay | Admitting: Nurse Practitioner

## 2022-06-22 NOTE — Telephone Encounter (Signed)
Claudia Elliott called and said she had an appointment with her neurologist and they did not like her being on phentermine because it can cause high bp and increase seizures. Although I see she was taken off of that and I did inform her. They told her their are newer medications that can help her do the same as phentermine and she is asking if you can prescribe her an alternate drug even if it is an injectable. She [facility]

## 2022-07-06 ENCOUNTER — Other Ambulatory Visit (HOSPITAL_COMMUNITY): Payer: Self-pay

## 2022-07-07 NOTE — Telephone Encounter (Signed)
TR ptn has changed mind she does not want the lollipop scar - checked with [place] - no other choices- ptn said she will seek other options

## 2022-08-04 ENCOUNTER — Telehealth: Payer: Self-pay | Admitting: Nurse Practitioner

## 2022-08-04 NOTE — Telephone Encounter (Signed)
Fax receive from [facility] that patient declined scheduling a visit due to: "Already has a psychiatrist!"  Chart review reveals that my partner, Kristian Covey, PA-c provided a referral to neuropsychiatry for the patient at a recent visit when she presented with concerns of increased stress, fatigue, increase in seizures, and use of phentermine for energy (although this medication has been discontinued). His not reports that the referral was made for further consult or second opinion on her current treatment plan to help better manage both her anxiety and seizures.   If further evaluation is needed, a new referral will need to be placed.

## 2022-08-31 ENCOUNTER — Ambulatory Visit: Payer: Medicare (Managed Care)

## 2022-08-31 ENCOUNTER — Ambulatory Visit: Payer: Medicare (Managed Care) | Admitting: Medical

## 2022-09-06 ENCOUNTER — Ambulatory Visit: Payer: Medicare (Managed Care) | Admitting: Medical

## 2022-09-06 NOTE — Progress Notes (Deleted)
Anxiety, migraine Hirsuitism?

## 2022-09-08 ENCOUNTER — Telehealth (INDEPENDENT_AMBULATORY_CARE_PROVIDER_SITE_OTHER): Payer: Medicare (Managed Care) | Admitting: Medical

## 2022-09-08 VITALS — Ht 66.0 in | Wt 170.0 lb

## 2022-09-08 DIAGNOSIS — Z79899 Other long term (current) drug therapy: Secondary | ICD-10-CM

## 2022-09-08 DIAGNOSIS — F439 Reaction to severe stress, unspecified: Secondary | ICD-10-CM

## 2022-09-08 DIAGNOSIS — R635 Abnormal weight gain: Secondary | ICD-10-CM | POA: Diagnosis not present

## 2022-09-08 DIAGNOSIS — I10 Essential (primary) hypertension: Secondary | ICD-10-CM

## 2022-09-08 DIAGNOSIS — G40309 Generalized idiopathic epilepsy and epileptic syndromes, not intractable, without status epilepticus: Secondary | ICD-10-CM

## 2022-09-08 DIAGNOSIS — F419 Anxiety disorder, unspecified: Secondary | ICD-10-CM

## 2022-09-08 NOTE — Progress Notes (Signed)
Subjective:     Patient ID: Claudia Elliott, female   DOB: 1965-07-08, 57 y.o.   MRN: [id number]  This visit type was conducted due to national recommendations for restrictions regarding the COVID-19 Pandemic (e.g. social distancing) in an effort to limit this patient's exposure and mitigate transmission in our community.  Due to their co-morbid illnesses, this patient is at least at moderate risk for complications without adequate follow up.  This format is felt to be most appropriate for this patient at this time.    Documentation for virtual audio and video telecommunications through [facility] encounter:  The patient was located at home. The provider was located in the office. The patient did consent to this visit and is aware of possible charges through their insurance for this visit.  The other persons participating in this telemedicine service were none. Time spent on call was 20 minutes and in review of previous records 20 minutes total.  This virtual service is not related to other E/M service within previous 7 days.   HPI Chief Complaint  Patient presents with   Consult    Discuss weight gain and would like something to help weight loss   Virtual consult today to discuss weight gain.  She disclosed at her recent visit we did that she had been doing some phentermine to help maintain weight which was helping.  But after our discussion after discussion with her neurologist, she no longer wants to be on phentermine due to safety issues.  She did feel like it was helping her maintain weight and gave her a boost of energy.  She recently stopped the metformin and since then has been gaining some weight and feels less energetic to go out and exercise.  She is tearful on the phone talking about this today.  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  Hospice has been called in.  She along with her elderly father is taking care of her mother.  She has a lot of  stress and anxiety related to that.  She also has stress knowing that she may be at risk for developing neurological disorder such as dementia given mom's history and given her seizure disorder diagnosis.  She is interested in Zepbound to help with weight loss.  She does not want to use any type of stimulant such as ADD medication given the way it made her son change including mood changes and becoming emaciated due to his weight loss on stimulant medication.  She does see a counselor regularly every few weeks up to every 6 weeks.  She sees psychiatrist Dr. Nils Pyle who recommended she talk to her PCP about getting up and turning  She recently let go of her part-time job due to anxiety and stress.  She is just working 1 job now  Eating healthy, pescetarian diet.  Not eaing lots of carbs.  Eats fresh fruits and vegetables.  Sometimes eats chicken but mostly fish and vegetarian diet.  She eats breakfast and typically a larger afternoon meal around 4 PM.    Lately not exercising because it so hard to get up and exercise, doesn't have energy.  She attributes this to anxiety and stress, not being on the phentermine anymore, work requirements and being a caregiver for apparently was in her late stages.  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  No other aggravating or relieving factors. No other complaint.   Past  Medical History:  Diagnosis Date   Abnormal Pap smear    repeat pap   Allergy    Bruising tendency 04/02/2021   COVID-19 03/05/2021   Depression    Edema 04/02/2021   Fibroid    Heart murmur    Irritant dermatitis 10/11/2018   Migraine    Panic attacks    Primary hypertension 03/05/2021   Seizures (HCC)    x 3 , last seizure 06/23/14   Strep throat 04/02/2021   Subacute cough 03/20/2021   Current Outpatient Medications on File Prior to Visit  Medication Sig Dispense Refill   acetaminophen (TYLENOL) 500 MG tablet Take 2 tablets  (1,000 mg total) by mouth every 8 (eight) hours as needed. Alternate with ibuprofen. 60 tablet 1   Blood Pressure Monitoring (BLOOD PRESSURE KIT) KIT For monitoring BP. ICD-10 I10 1 kit 0   buPROPion (WELLBUTRIN XL) 300 MG 24 hr tablet Take 300 mg by mouth daily.     busPIRone (BUSPAR) 30 MG tablet Take 60 mg by mouth daily.     DULoxetine (CYMBALTA) 60 MG capsule Take 2 capsules by mouth daily.  3   hydroquinone 4 % cream Apply small pea-sized amount nightly to affected dark spots.     levETIRAcetam (KEPPRA) 1000 MG tablet Take 1 tablet (1,000 mg total) by mouth daily. 90 each 3   levETIRAcetam (KEPPRA) 750 MG tablet Take 1 tablet (750 mg total) by mouth daily. 90 each 3   loperamide (IMODIUM A-D) 2 MG tablet Take 1 tablet (2 mg total) by mouth 4 (four) times daily as needed for diarrhea or loose stools. 30 tablet 0   Multiple Vitamins-Minerals (MULTIVITAMIN GUMMIES ADULT) CHEW Take 2 gummies by mouth daily. 60 tablet 11   NONFORMULARY OR COMPOUNDED ITEM Vitamin E vaginal cream 200u/ml.  One ml two to three times weekly.  Disp: 3 month supply. 36 each 3   Sulfacetamide Sodium, Acne, 10 % LOTN Apply to face each am.     traZODone (DESYREL) 50 MG tablet Take 1 tablet (50 mg total) by mouth at bedtime. 30 tablet 11   verapamil (CALAN-SR) 240 MG CR tablet Take 1 tablet (240 mg total) by mouth at bedtime. 30 tablet 5   zonisamide (ZONEGRAN) 100 MG capsule Take by mouth.     pantoprazole (PROTONIX) 40 MG tablet Take 1 tablet (40 mg total) by mouth daily. (Patient not taking: Reported on 09/08/2022) 30 tablet 3   No current facility-administered medications on file prior to visit.   Family History  Problem Relation Age of Onset   Diabetes Maternal Grandmother    Hypertension Maternal Grandmother    Hypertension Mother     Review of Systems As in subjective    Objective:   Physical Exam Due to coronavirus pandemic stay at home measures, patient visit was virtual and they were not examined in  person.   Ht 5\' 6"  (1.676 m)   Wt 170 lb (77.1 kg)   LMP 06/13/2014 (Approximate)   BMI 27.44 kg/m   Wt Readings from Last 3 Encounters:  09/08/22 170 lb (77.1 kg)  06/10/22 160 lb (72.6 kg)  04/09/22 168 lb (76.2 kg)   Gen: wd, wn, nad Tearful throughout parts of the conversation today Psych: Pleasant but seems stressed and frustrated     Assessment:     Encounter Diagnoses  Name Primary?   Weight gain Yes   Epilepsy, generalized, convulsive (HCC)    Situational stress    Primary hypertension  Anxiety        Plan:     We discussed risk concerns, recent weight gain.  She has a lot going on being a caregiver for her elderly mother who is in hospice currently for vascular dementia and late stages.  She was tearful throughout much of the interview today.  She apparently is eating fairly healthy but not getting a lot of exercise currently due to stress and no energy.  Phentermine certainly is not a good long-term option given safety concerns and blood pressure concerns.  We discussed her concern for being on weight loss medicine to maintain her weight or to prevent weight gain.  Continue healthy diet.  Counseled on strategies to help start exercising more regularly despite lack of energy.  Discussed potential for B12 supplement.  We discussed other weight loss medicine such as GLP-1 medications which Medicare will not pay for.  We discussed other compounded pharmacies that are offering a similar medication that could be an option.  We discussed Contrave as well.  She is already on Wellbutrin which is part of Contrave components.  I recommended she do some additional counseling soon and make sure her counselor is aware of her emotions currently, her concerns about weight gain, her lack of energy.  She absolutely refuses stimulant medicine such as ADD medication.  We discussed medicine such as Provigil which could help with energy but again this would be at the discretion  of neurology or psychiatry.  I advised I do not have great answers for maintaining way other than trying to get some regular exercise and a discipline approach that she is not currently doing.  She reports eating quite healthy currently.  I will reach out to her neurologist about some of these concerns.  Advise she follow-up with her psychiatrist to discuss some these concerns as well.  We obviously want to avoid any medicines going to lower her seizure threshold potential  Spent > 45 minutes face to face with patient in discussion of symptoms, evaluation, plan and recommendations.    Payslee was seen today for consult.  Diagnoses and all orders for this visit:  Weight gain  Epilepsy, generalized, convulsive (HCC)  Situational stress  Primary hypertension  Anxiety  F/u pending call back

## 2022-09-17 ENCOUNTER — Other Ambulatory Visit (HOSPITAL_COMMUNITY): Payer: Self-pay

## 2022-10-19 ENCOUNTER — Ambulatory Visit: Payer: Medicare (Managed Care)

## 2022-10-19 ENCOUNTER — Ambulatory Visit
Admission: EM | Admit: 2022-10-19 | Discharge: 2022-10-19 | Disposition: A | Discharge status: Home / Self Care | Payer: Medicare (Managed Care) | Attending: Family Medicine | Admitting: Family Medicine

## 2022-10-19 DIAGNOSIS — G43809 Other migraine, not intractable, without status migrainosus: Secondary | ICD-10-CM

## 2022-10-19 DIAGNOSIS — J069 Acute upper respiratory infection, unspecified: Secondary | ICD-10-CM

## 2022-10-19 DIAGNOSIS — Z1152 Encounter for screening for COVID-19: Secondary | ICD-10-CM | POA: Diagnosis not present

## 2022-10-19 DIAGNOSIS — R509 Fever, unspecified: Secondary | ICD-10-CM | POA: Diagnosis present

## 2022-10-19 MED ORDER — KETOROLAC TROMETHAMINE 30 MG/ML IJ SOLN
30.0000 mg | Freq: Once | INTRAMUSCULAR | Status: AC
  Administered 2022-10-19: 30 mg via INTRAMUSCULAR

## 2022-10-19 MED ORDER — PROMETHAZINE-DM 6.25-15 MG/5ML PO SYRP: 5.0000 mL | ORAL_SOLUTION | Freq: Four times a day (QID) | ORAL | 0 refills | Status: DC | PRN

## 2022-10-19 NOTE — ED Triage Notes (Signed)
Pt reports headache, congestion, fever 101.0 F, chills, headache, seeing spots  and sore throat x 4-5 days. Pt ha snot taken any meds for complaints.

## 2022-10-19 NOTE — Progress Notes (Deleted)
[name] [address] [place] [address] Phone: [phone] Subjective:    I'm seeing this patient by the request  of:  Early, Sung Amabile, NP  CC:   [id number]  Claudia Elliott is a 57 y.o. female coming in with complaint of back and knee pain. Patient states       Past Medical History:  Diagnosis Date   Abnormal Pap smear    repeat pap   Allergy    Bruising tendency 04/02/2021   COVID-19 03/05/2021   Depression    Edema 04/02/2021   Fibroid    Heart murmur    Irritant dermatitis 10/11/2018   Migraine    Panic attacks    Primary hypertension 03/05/2021   Seizures (HCC)    x 3 , last seizure 06/23/14   Strep throat 04/02/2021   Subacute cough 03/20/2021   Past Surgical History:  Procedure Laterality Date   APPENDECTOMY     BREAST BIOPSY Right 2013   BREAST CYST ASPIRATION Right 12/2012   KNEE SURGERY Right 07/19/2017   Social History   Socioeconomic History   Marital status: Divorced    Spouse name: Not on file   Number of children: 1   Years of education: MA   Highest education level: Not on file  Occupational History   Occupation: [employer]    Employer: [employer]  Tobacco Use   Smoking status: Former    Types: Cigarettes   Smokeless tobacco: Never   Tobacco comments:    Quit over 20 years ago. Only smoked for less than a year  Vaping Use   Vaping status: Never Used  Substance and Sexual Activity   Alcohol use: Yes    Alcohol/week: 2.0 standard drinks of alcohol    Types: 2 Glasses of wine per week   Drug use: No   Sexual activity: Not Currently    Birth control/protection: Post-menopausal  Other Topics Concern   Not on file  Social History Narrative   Patient lives at home with son.   Patient is right handed.   Patient has a masters degree   Caffeine Use: 1 cup of tea daily   Social Determinants of Corporate investment banker Strain: Not on file  Food Insecurity: Not on  file  Transportation Needs: Not on file  Physical Activity: Not on file  Stress: Not on file  Social Connections: Not on file   Allergies  Allergen Reactions   Dilaudid [Hydromorphone] Hives    Out of it, takes a long time to come back   Percocet [Oxycodone-Acetaminophen] Itching   Family History  Problem Relation Age of Onset   Diabetes Maternal Grandmother    Hypertension Maternal Grandmother    Hypertension Mother      Current Outpatient Medications (Cardiovascular):    verapamil (CALAN-SR) 240 MG CR tablet, Take 1 tablet (240 mg total) by mouth at bedtime.   Current Outpatient Medications (Analgesics):    acetaminophen (TYLENOL) 500 MG tablet, Take 2 tablets (1,000 mg total) by mouth every 8 (eight) hours as needed. Alternate with ibuprofen.   Current Outpatient Medications (Other):    Blood Pressure Monitoring (BLOOD PRESSURE KIT) KIT, For monitoring BP. ICD-10 I10   buPROPion (WELLBUTRIN XL) 300 MG 24 hr tablet, Take 300 mg by mouth daily.   busPIRone (BUSPAR) 30 MG tablet, Take 60 mg by mouth daily.   DULoxetine (CYMBALTA) 60 MG capsule, Take 2 capsules by mouth daily.  hydroquinone 4 % cream, Apply small pea-sized amount nightly to affected dark spots.   levETIRAcetam (KEPPRA) 1000 MG tablet, Take 1 tablet (1,000 mg total) by mouth daily.   levETIRAcetam (KEPPRA) 750 MG tablet, Take 1 tablet (750 mg total) by mouth daily.   loperamide (IMODIUM A-D) 2 MG tablet, Take 1 tablet (2 mg total) by mouth 4 (four) times daily as needed for diarrhea or loose stools.   Multiple Vitamins-Minerals (MULTIVITAMIN GUMMIES ADULT) CHEW, Take 2 gummies by mouth daily.   NONFORMULARY OR COMPOUNDED ITEM, Vitamin E vaginal cream 200u/ml.  One ml two to three times weekly.  Disp: 3 month supply.   pantoprazole (PROTONIX) 40 MG tablet, Take 1 tablet (40 mg total) by mouth daily. (Patient not taking: Reported on 09/08/2022)   Sulfacetamide Sodium, Acne, 10 % LOTN, Apply to face each am.    traZODone (DESYREL) 50 MG tablet, Take 1 tablet (50 mg total) by mouth at bedtime.   zonisamide (ZONEGRAN) 100 MG capsule, Take by mouth.   Reviewed prior external information including notes and imaging from  primary care provider As well as notes that were available from care everywhere and other healthcare systems.  Past medical history, social, surgical and family history all reviewed in electronic medical record.  No pertanent information unless stated regarding to the chief complaint.   Review of Systems:  No headache, visual changes, nausea, vomiting, diarrhea, constipation, dizziness, abdominal pain, skin rash, fevers, chills, night sweats, weight loss, swollen lymph nodes, body aches, joint swelling, chest pain, shortness of breath, mood changes. POSITIVE muscle aches  Objective  Last menstrual period 06/13/2014.   General: No apparent distress alert and oriented x3 mood and affect normal, dressed appropriately.  HEENT: Pupils equal, extraocular movements intact  Respiratory: Patient's speak in full sentences and does not appear short of breath  Cardiovascular: No lower extremity edema, non tender, no erythema      Impression and Recommendations:

## 2022-10-20 ENCOUNTER — Ambulatory Visit: Payer: Medicare (Managed Care) | Admitting: Family Medicine

## 2022-10-20 LAB — SARS CORONAVIRUS 2 (TAT 6-24 HRS): SARS Coronavirus 2: NEGATIVE

## 2022-10-20 NOTE — ED Provider Notes (Addendum)
[facility]    CSN: [id number] Arrival date & time: 10/19/22  1554      History   Chief Complaint Chief Complaint  Patient presents with   Headache   Sore Throat         HPI Claudia Elliott is a 57 y.o. female.   Patient presenting today with 4 to 5-day history of headache, congestion, fever, chills, cough, sore throat.  Denies chest pain, shortness of breath, abdominal pain, nausea vomiting or diarrhea.  States she feels significantly weak and fatigued.  So far not trying anything over-the-counter for symptoms.  No known sick contacts recently.  No known history of chronic pulmonary disease.    Past Medical History:  Diagnosis Date   Abnormal Pap smear    repeat pap   Allergy    Bruising tendency 04/02/2021   COVID-19 03/05/2021   Depression    Edema 04/02/2021   Fibroid    Heart murmur    Irritant dermatitis 10/11/2018   Migraine    Panic attacks    Primary hypertension 03/05/2021   Seizures (HCC)    x 3 , last seizure 06/23/14   Strep throat 04/02/2021   Subacute cough 03/20/2021    Patient Active Problem List   Diagnosis Date Noted   High risk medication use 09/08/2022   Weight gain 09/08/2022   Anxiety 09/08/2022   Back pain 04/09/2022   Neck pain 04/09/2022   Breast asymmetry in female 04/09/2022   Symptomatic mammary hypertrophy 04/09/2022   Acute gastritis without hemorrhage 03/29/2022   Situational stress 02/05/2022   Pain involving joint of finger of right hand 07/07/2021   Gastroesophageal reflux disease 04/28/2021   Primary hypertension 03/05/2021   Generalized anxiety disorder 03/05/2021   Weight gain, abnormal 03/05/2021   Hirsutism 10/11/2018   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   Migraine 08/25/2014   Episode of recurrent major depressive disorder (HCC) 08/25/2014   Cyst of breast, right, diffuse fibrocystic 02/12/2013   Epilepsy, generalized, 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 Authorizing Provider  Ferrous Sulfate (IRON PO) Take by mouth. 05/14/21  Yes [provider]  promethazine-dextromethorphan (PROMETHAZINE-DM) 6.25-15 MG/5ML syrup Take 5 mLs by mouth 4 (four) times daily as needed for cough. 10/19/22  Yes Particia Nearing, PA-C  acetaminophen (TYLENOL) 500 MG tablet Take 2 tablets (1,000 mg total) by mouth every 8 (eight) hours as needed. Alternate with ibuprofen. 03/05/21   Tollie Eth, NP  Blood Pressure Monitoring (BLOOD PRESSURE KIT) KIT For monitoring BP. ICD-10 I10 03/05/21   Tollie Eth, NP  buPROPion (WELLBUTRIN XL) 300 MG 24 hr tablet Take 300 mg by mouth daily. 12/07/18   [provider]  busPIRone (BUSPAR) 30 MG tablet Take 60  mg by mouth daily.    [provider]  clonazePAM (KLONOPIN) 1 MG tablet Take 1 mg by mouth at bedtime as needed.    [provider]  DULoxetine (CYMBALTA) 60 MG capsule Take 2 capsules by mouth daily. 10/15/17   [provider]  hydroquinone 4 % cream Apply small pea-sized amount nightly to affected dark spots. 10/12/18   [provider]  levETIRAcetam (KEPPRA) 1000 MG tablet Take 1 tablet (1,000 mg total) by mouth daily. 03/05/21   Tollie Eth, NP  levETIRAcetam (KEPPRA) 750 MG tablet Take 1 tablet (750 mg total) by mouth daily. 03/05/21   Tollie Eth, NP  loperamide (IMODIUM A-D) 2 MG tablet Take 1 tablet (2 mg total) by mouth 4 (four) times daily as needed for diarrhea or loose stools. 03/29/22   Tollie Eth, NP  Multiple Vitamins-Minerals (MULTIVITAMIN GUMMIES ADULT) CHEW  Take 2 gummies by mouth daily. 04/28/21   Tollie Eth, NP  NONFORMULARY OR COMPOUNDED ITEM Vitamin E vaginal cream 200u/ml.  One ml two to three times weekly.  Disp: 3 month supply. 01/26/21   Jerene Bears, MD  pantoprazole (PROTONIX) 40 MG tablet Take 1 tablet (40 mg total) by mouth daily. Patient not taking: Reported on 09/08/2022 03/29/22   Tollie Eth, NP  Sulfacetamide Sodium, Acne, 10 % LOTN Apply to face each am. 10/24/18   [provider]  traZODone (DESYREL) 50 MG tablet Take 1 tablet (50 mg total) by mouth at bedtime. 03/05/21   Tollie Eth, NP  verapamil (CALAN-SR) 240 MG CR tablet Take 1 tablet (240 mg total) by mouth at bedtime. 05/25/22   Tollie Eth, NP  zonisamide (ZONEGRAN) 100 MG capsule Take by mouth. 10/25/21   [provider]    Family History Family History  Problem Relation Age of Onset   Diabetes Maternal Grandmother    Hypertension Maternal Grandmother    Hypertension Mother     Social History Social History   Tobacco Use   Smoking status: Former    Types: Cigarettes   Smokeless tobacco: Never   Tobacco comments:    Quit over 20 years ago. Only smoked for less than a year  Vaping Use   Vaping status: Never Used  Substance Use Topics   Alcohol use: Yes    Alcohol/week: 2.0 standard drinks of alcohol    Types: 2 Glasses of wine per week   Drug use: No     Allergies   Dilaudid [hydromorphone] and Percocet [oxycodone-acetaminophen]   Review of Systems Review of Systems Per HPI  Physical Exam Triage Vital Signs ED Triage Vitals  Encounter Vitals Group     BP 10/19/22 1601 124/85     Systolic BP Percentile --      Diastolic BP Percentile --      Pulse Rate 10/19/22 1608 76     Resp 10/19/22 1601 18     Temp 10/19/22 1601 99.5 F (37.5 C)     Temp Source 10/19/22 1601 Oral     SpO2 10/19/22 1608 96 %     Weight --      Height --      Head Circumference --      Peak Flow --      Pain Score 10/19/22 1606 10     Pain  Loc --      Pain Education --      Exclude from Growth Chart --    No data found.  Updated Vital Signs BP 124/85 (BP Location: Left Arm)   Pulse 76   Temp 99.5 F (37.5 C) (Oral)   Resp 18   LMP 06/13/2014 (Approximate)   SpO2 96%   Visual Acuity Right Eye Distance:   Left Eye Distance:   Bilateral Distance:    Right Eye Near:   Left Eye Near:    Bilateral Near:     Physical Exam Vitals and nursing note reviewed.  Constitutional:      Appearance: Normal appearance.  HENT:     Head: Atraumatic.     Right Ear: Tympanic membrane and external ear normal.     Left Ear: Tympanic membrane and external ear normal.     Nose: Rhinorrhea present.     Mouth/Throat:     Mouth: Mucous membranes are moist.     Pharynx: Posterior oropharyngeal erythema present.  Eyes:     Extraocular Movements: Extraocular movements intact.     Conjunctiva/sclera: Conjunctivae normal.  Cardiovascular:     Rate and Rhythm: Normal rate and regular rhythm.     Heart sounds: Normal heart sounds.  Pulmonary:     Effort: Pulmonary effort is normal.     Breath sounds: Normal breath sounds. No wheezing.  Musculoskeletal:        General: Normal range of motion.     Cervical back: Normal range of motion and neck supple.  Skin:    General: Skin is warm and dry.  Neurological:     Mental Status: She is alert and oriented to person, place, and time.  Psychiatric:        Mood and Affect: Mood normal.        Thought Content: Thought content normal.      UC Treatments / Results  Labs (all labs ordered are listed, but only abnormal results are displayed) Labs Reviewed  SARS CORONAVIRUS 2 (TAT 6-24 HRS)    EKG   Radiology No results found.  Procedures Procedures (including critical care time)  Medications Ordered in UC Medications  ketorolac (TORADOL) 30 MG/ML injection 30 mg (30 mg Intramuscular Given 10/19/22 1657)    Initial Impression / Assessment and Plan / UC Course  I have  reviewed the triage vital signs and the nursing notes.  Pertinent labs & imaging results that were available during my care of the patient were reviewed by me and considered in my medical decision making (see chart for details).     Vitals and exam overall reassuring, suspicious for viral respiratory infection.  COVID testing pending, good candidate for molnupiravir if positive.  IM Toradol given for headache and bodyaches.  Treat with Phenergan DM, supportive over-the-counter medications and home care.  Return for worsening symptoms.  Final Clinical Impressions(s) / UC Diagnoses   Final diagnoses:  Viral URI with cough  Other migraine without status migrainosus, not intractable   Discharge Instructions   None    ED Prescriptions     Medication Sig Dispense Auth. Provider   promethazine-dextromethorphan (PROMETHAZINE-DM) 6.25-15 MG/5ML syrup Take 5 mLs by mouth 4 (four) times daily as needed for cough. 118 mL Particia Nearing, [place]      PDMP not reviewed this encounter.   Particia Nearing, PA-C 10/20/22 2009    Roosvelt Maser [place], [place] 10/20/22 2009

## 2022-11-11 ENCOUNTER — Encounter: Payer: Self-pay | Admitting: Nurse Practitioner

## 2022-11-11 ENCOUNTER — Ambulatory Visit: Payer: Medicare (Managed Care) | Admitting: Nurse Practitioner

## 2022-11-11 VITALS — BP 130/82 | HR 75 | Ht 66.0 in | Wt 166.4 lb

## 2022-11-11 DIAGNOSIS — F339 Major depressive disorder, recurrent, unspecified: Secondary | ICD-10-CM

## 2022-11-11 DIAGNOSIS — I10 Essential (primary) hypertension: Secondary | ICD-10-CM

## 2022-11-11 DIAGNOSIS — G40109 Localization-related (focal) (partial) symptomatic epilepsy and epileptic syndromes with simple partial seizures, not intractable, without status epilepticus: Secondary | ICD-10-CM

## 2022-11-11 DIAGNOSIS — R635 Abnormal weight gain: Secondary | ICD-10-CM

## 2022-11-11 DIAGNOSIS — Z0000 Encounter for general adult medical examination without abnormal findings: Secondary | ICD-10-CM

## 2022-11-11 DIAGNOSIS — F411 Generalized anxiety disorder: Secondary | ICD-10-CM

## 2022-11-11 DIAGNOSIS — G43809 Other migraine, not intractable, without status migrainosus: Secondary | ICD-10-CM

## 2022-11-11 DIAGNOSIS — M25569 Pain in unspecified knee: Secondary | ICD-10-CM

## 2022-11-11 MED ORDER — VERAPAMIL HCL ER 240 MG PO TBCR
240.0000 mg | EXTENDED_RELEASE_TABLET | Freq: Every day | ORAL | 5 refills | Status: DC
Start: 2022-11-11 — End: 2023-01-03

## 2022-11-11 NOTE — Progress Notes (Deleted)
[name] [address] [place] [address] Phone: [phone] Subjective:    I'm seeing this patient by the request  of:  Early, Sung Amabile, NP  CC:   [id number]  Claudia Elliott is a 57 y.o. female coming in with complaint of r knee and lower back  Onset-  Location Duration-  Character- Aggravating factors- Reliving factors-  Therapies tried-  Severity-     Past Medical History:  Diagnosis Date   Abnormal Pap smear    repeat pap   Acute gastritis without hemorrhage 03/29/2022   Allergy    Breast asymmetry in female 04/09/2022   Bruising tendency 04/02/2021   COVID-19 03/05/2021   Depression    Edema 04/02/2021   Fibroid    Heart murmur    Hirsutism 10/11/2018   Irritant dermatitis 10/11/2018   Migraine    Pain involving joint of finger of right hand 07/07/2021   Panic attacks    Primary hypertension 03/05/2021   Seizures (HCC)    x 3 , last seizure 06/23/14   Strep throat 04/02/2021   Subacute cough 03/20/2021   Past Surgical History:  Procedure Laterality Date   APPENDECTOMY     BREAST BIOPSY Right 2013   BREAST CYST ASPIRATION Right 12/2012   KNEE SURGERY Right 07/19/2017   Social History   Socioeconomic History   Marital status: Divorced    Spouse name: Not on file   Number of children: 1   Years of education: MA   Highest education level: Not on file  Occupational History   Occupation: [employer]    Employer: [employer]  Tobacco Use   Smoking status: Former    Types: Cigarettes   Smokeless tobacco: Never   Tobacco comments:    Quit over 20 years ago. Only smoked for less than a year  Vaping Use   Vaping status: Never Used  Substance and Sexual Activity   Alcohol use: Yes    Alcohol/week: 2.0 standard drinks of alcohol    Types: 2 Glasses of wine per week   Drug use: No   Sexual activity: Not Currently    Birth control/protection: Post-menopausal  Other Topics Concern    Not on file  Social History Narrative   Patient lives at home with son.   Patient is right handed.   Patient has a masters degree   Caffeine Use: 1 cup of tea daily   Social Determinants of Corporate investment banker Strain: Not on file  Food Insecurity: Not on file  Transportation Needs: Not on file  Physical Activity: Not on file  Stress: Not on file  Social Connections: Not on file   Allergies  Allergen Reactions   Dilaudid [Hydromorphone] Hives    Out of it, takes a long time to come back   Percocet [Oxycodone-Acetaminophen] Itching   Family History  Problem Relation Age of Onset   Diabetes Maternal Grandmother    Hypertension Maternal Grandmother    Hypertension Mother      Current Outpatient Medications (Cardiovascular):    verapamil (CALAN-SR) 240 MG CR tablet, Take 1 tablet (240 mg total) by mouth at bedtime.   Current Outpatient Medications (Analgesics):    acetaminophen (TYLENOL) 500 MG tablet, Take 2 tablets (1,000 mg total) by mouth every 8 (eight) hours as needed. Alternate with ibuprofen.   Current Outpatient Medications (Other):    Blood Pressure Monitoring (BLOOD PRESSURE KIT) KIT, For monitoring BP. ICD-10 I10  buPROPion (WELLBUTRIN XL) 300 MG 24 hr tablet, Take 300 mg by mouth daily.   busPIRone (BUSPAR) 30 MG tablet, Take 60 mg by mouth daily.   clonazePAM (KLONOPIN) 1 MG tablet, Take 1 mg by mouth at bedtime as needed.   DULoxetine (CYMBALTA) 60 MG capsule, Take 2 capsules by mouth daily.   hydroquinone 4 % cream, Apply small pea-sized amount nightly to affected dark spots.   levETIRAcetam (KEPPRA) 1000 MG tablet, Take 1 tablet (1,000 mg total) by mouth daily.   levETIRAcetam (KEPPRA) 750 MG tablet, Take 1 tablet (750 mg total) by mouth daily.   loperamide (IMODIUM A-D) 2 MG tablet, Take 1 tablet (2 mg total) by mouth 4 (four) times daily as needed for diarrhea or loose stools.   Multiple Vitamins-Minerals (MULTIVITAMIN GUMMIES ADULT) CHEW,  Take 2 gummies by mouth daily.   NONFORMULARY OR COMPOUNDED ITEM, Vitamin E vaginal cream 200u/ml.  One ml two to three times weekly.  Disp: 3 month supply.   Sulfacetamide Sodium, Acne, 10 % LOTN, Apply to face each am.   traZODone (DESYREL) 50 MG tablet, Take 1 tablet (50 mg total) by mouth at bedtime.   zonisamide (ZONEGRAN) 100 MG capsule, Take by mouth.   Reviewed prior external information including notes and imaging from  primary care provider As well as notes that were available from care everywhere and other healthcare systems.  Past medical history, social, surgical and family history all reviewed in electronic medical record.  No pertanent information unless stated regarding to the chief complaint.   Review of Systems:  No headache, visual changes, nausea, vomiting, diarrhea, constipation, dizziness, abdominal pain, skin rash, fevers, chills, night sweats, weight loss, swollen lymph nodes, body aches, joint swelling, chest pain, shortness of breath, mood changes. POSITIVE muscle aches  Objective  Last menstrual period 06/13/2014.   General: No apparent distress alert and oriented x3 mood and affect normal, dressed appropriately.  HEENT: Pupils equal, extraocular movements intact  Respiratory: Patient's speak in full sentences and does not appear short of breath  Cardiovascular: No lower extremity edema, non tender, no erythema      Impression and Recommendations:

## 2022-11-11 NOTE — Progress Notes (Signed)
Shawna Clamp, DNP, AGNP-c [facility] [address] [place], [place] [address] Main Office [phone]  BP 130/82   Pulse 75   Ht 5\' 6"  (1.676 m)   Wt 166 lb 6.4 oz (75.5 kg)   LMP 06/13/2014 (Approximate)   BMI 26.86 kg/m    Subjective:    Patient ID: Claudia Elliott, female    DOB: 10-Jun-1965, 57 y.o.   MRN: [id number]  HPI: Claudia Elliott is a 57 y.o. female presenting on 11/11/2022 for comprehensive medical examination.   History of Present Illness Claudia Elliott presents for her annual exam today.    Today she reports ongoing seizures despite being on Keppra 1750mg . She reports having two seizures in the past six months, which were "not severe". She also reports a constant headache, described as a throbbing sensation in the temples, which has been ongoing for about 21 days. She has an upcoming appointment with a neuro-ophthalmologist due to changes in vision, which has become blurrier since the onset of the headache.  Claudia Elliott also reports significant psychosocial stressors. She is currently living with her brother due to financial constraints related to being on disability. She recently stopped working due to the job's high stress levels and inability to work full time with thoughts that the stress was contributing to her seizures. The patient's mother, who has Alzheimer's disease, is reportedly declining and is now bed-bound. The patient's father is also in poor health but refuses medical care. Claudia Elliott assists in caregiving for her mother, which includes physically moving her, adding to the patient's stress.  She is currently under the care of a psychiatrist and a therapist.  She also reports knee pain, which has limited her ability to engage in physical activities such as dancing. She has an upcoming appointment with an orthopedist for this issue.    The patient's son, who was previously living with her, has moved out and is considering joining the [employer].  This has  been a stress reliever for her .  Pertinent items are noted in HPI.  IMMUNIZATIONS:   Flu Vaccine: Flu vaccine declined, patient does not wish to complete Prevnar 13: Prevnar 13 N/A for this patient Prevnar 20: Prevnar 20 N/A for this patient Pneumovax 23: Pneumovax 23 N/A for this patient Vac Shingrix: Shingrix declined, patient does not wish to have this vaccine HPV: N/A or Aged Out Tetanus: Tetanus completed in the last 10 years COVID: Declined, patient does not wish to have this vaccine and patient requests permanent removal from health maintenance RSV: No  HEALTH MAINTENANCE: Pap Smear HM Status: is up to date Mammogram HM Status: is up to date Colon Cancer Screening HM Status: is up to date Bone Density HM Status: N/A STI Testing HM Status: was declined  Lung CT HM Status: was declined   Concerns with vision, hearing, or dentition: No  Most Recent Depression Screen:     11/11/2022    2:51 PM 02/15/2022    2:46 PM 11/09/2021    3:05 PM 06/03/2021   11:21 AM 04/28/2021    2:04 PM  Depression screen PHQ 2/9  Decreased Interest 0 0 1 0 1  Down, Depressed, Hopeless 2 1 1  0 1  PHQ - 2 Score 2 1 2  0 2  Altered sleeping 3  3  1   Tired, decreased energy 3  2  1   Change in appetite 1  2  1   Feeling bad or failure about yourself  1  2  1   Trouble  concentrating 1  1  0  Moving slowly or fidgety/restless 0  1  0  Suicidal thoughts 0  0  0  PHQ-9 Score 11  13  6   Difficult doing work/chores Very difficult  Somewhat difficult  Somewhat difficult   Most Recent Anxiety Screen:      No data to display         Most Recent Fall Screen:    11/11/2022    2:50 PM 02/15/2022    2:47 PM 12/03/2021    4:17 PM 11/09/2021    3:04 PM 06/03/2021   11:21 AM  Fall Risk   Falls in the past year? 1 1 0 1 0  Number falls in past yr: 1 1 0 1 0  Comment about 6      Injury with Fall? 1 0 0 0 0  Risk for fall due to : Impaired balance/gait  No Fall Risks History of fall(s) No Fall Risks   Follow up Falls evaluation completed  Falls evaluation completed;Education provided Falls evaluation completed;Education provided Falls evaluation completed;Education provided    Past medical history, surgical history, medications, allergies, family history and social history reviewed with patient today and changes made to appropriate areas of the chart.  Past Medical History:  Past Medical History:  Diagnosis Date   Abnormal Pap smear    repeat pap   Acute gastritis without hemorrhage 03/29/2022   Allergy    Breast asymmetry in female 04/09/2022   Bruising tendency 04/02/2021   COVID-19 03/05/2021   Depression    Edema 04/02/2021   Fibroid    Heart murmur    Hirsutism 10/11/2018   Irritant dermatitis 10/11/2018   Migraine    Pain involving joint of finger of right hand 07/07/2021   Panic attacks    Primary hypertension 03/05/2021   Seizures (HCC)    x 3 , last seizure 06/23/14   Strep throat 04/02/2021   Subacute cough 03/20/2021   Weight gain, abnormal 03/05/2021   Medications:  Current Outpatient Medications on File Prior to Visit  Medication Sig   acetaminophen (TYLENOL) 500 MG tablet Take 2 tablets (1,000 mg total) by mouth every 8 (eight) hours as needed. Alternate with ibuprofen.   Blood Pressure Monitoring (BLOOD PRESSURE KIT) KIT For monitoring BP. ICD-10 I10   buPROPion (WELLBUTRIN XL) 300 MG 24 hr tablet Take 300 mg by mouth daily.   busPIRone (BUSPAR) 30 MG tablet Take 60 mg by mouth daily.   clonazePAM (KLONOPIN) 1 MG tablet Take 1 mg by mouth at bedtime as needed.   DULoxetine (CYMBALTA) 60 MG capsule Take 2 capsules by mouth daily.   hydroquinone 4 % cream Apply small pea-sized amount nightly to affected dark spots.   levETIRAcetam (KEPPRA) 1000 MG tablet Take 1 tablet (1,000 mg total) by mouth daily.   levETIRAcetam (KEPPRA) 750 MG tablet Take 1 tablet (750 mg total) by mouth daily.   loperamide (IMODIUM A-D) 2 MG tablet Take 1 tablet (2 mg total) by mouth 4  (four) times daily as needed for diarrhea or loose stools.   Multiple Vitamins-Minerals (MULTIVITAMIN GUMMIES ADULT) CHEW Take 2 gummies by mouth daily.   NONFORMULARY OR COMPOUNDED ITEM Vitamin E vaginal cream 200u/ml.  One ml two to three times weekly.  Disp: 3 month supply.   Sulfacetamide Sodium, Acne, 10 % LOTN Apply to face each am.   traZODone (DESYREL) 50 MG tablet Take 1 tablet (50 mg total) by mouth at bedtime.   zonisamide (ZONEGRAN) 100  MG capsule Take by mouth.   No current facility-administered medications on file prior to visit.   Surgical History:  Past Surgical History:  Procedure Laterality Date   APPENDECTOMY     BREAST BIOPSY Right 2013   BREAST CYST ASPIRATION Right 12/2012   KNEE SURGERY Right 07/19/2017   Allergies:  Allergies  Allergen Reactions   Dilaudid [Hydromorphone] Hives    Out of it, takes a long time to come back   Percocet [Oxycodone-Acetaminophen] Itching   Family History:  Family History  Problem Relation Age of Onset   Diabetes Maternal Grandmother    Hypertension Maternal Grandmother    Hypertension Mother        Objective:    BP 130/82   Pulse 75   Ht 5\' 6"  (1.676 m)   Wt 166 lb 6.4 oz (75.5 kg)   LMP 06/13/2014 (Approximate)   BMI 26.86 kg/m   Wt Readings from Last 3 Encounters:  11/11/22 166 lb 6.4 oz (75.5 kg)  09/08/22 170 lb (77.1 kg)  06/10/22 160 lb (72.6 kg)    Physical Exam Vitals and nursing note reviewed.  Constitutional:      General: She is not in acute distress.    Appearance: Normal appearance.  HENT:     Head: Normocephalic and atraumatic.     Right Ear: Hearing, tympanic membrane, ear canal and external ear normal.     Left Ear: Hearing, tympanic membrane, ear canal and external ear normal.     Nose: Nose normal.     Right Sinus: No maxillary sinus tenderness or frontal sinus tenderness.     Left Sinus: No maxillary sinus tenderness or frontal sinus tenderness.     Mouth/Throat:     Lips: Pink.      Mouth: Mucous membranes are moist.     Pharynx: Oropharynx is clear.  Eyes:     General: Lids are normal. Vision grossly intact.     Extraocular Movements: Extraocular movements intact.     Conjunctiva/sclera: Conjunctivae normal.     Pupils: Pupils are equal, round, and reactive to light.     Funduscopic exam:    Right eye: Red reflex present.        Left eye: Red reflex present.    Visual Fields: Right eye visual fields normal and left eye visual fields normal.  Neck:     Thyroid: No thyromegaly.     Vascular: No carotid bruit.  Cardiovascular:     Rate and Rhythm: Normal rate and regular rhythm.     Chest Wall: PMI is not displaced.     Pulses: Normal pulses.          Dorsalis pedis pulses are 2+ on the right side and 2+ on the left side.       Posterior tibial pulses are 2+ on the right side and 2+ on the left side.     Heart sounds: Normal heart sounds. No murmur heard. Pulmonary:     Effort: Pulmonary effort is normal. No respiratory distress.     Breath sounds: Normal breath sounds.  Abdominal:     General: Abdomen is flat. Bowel sounds are normal. There is no distension.     Palpations: Abdomen is soft. There is no hepatomegaly, splenomegaly or mass.     Tenderness: There is no abdominal tenderness. There is no right CVA tenderness, left CVA tenderness, guarding or rebound.  Musculoskeletal:        General: Normal range of motion.  Cervical back: Full passive range of motion without pain, normal range of motion and neck supple. No tenderness.     Right lower leg: No edema.     Left lower leg: No edema.  Feet:     Left foot:     Toenail Condition: Left toenails are normal.  Lymphadenopathy:     Cervical: No cervical adenopathy.     Upper Body:     Right upper body: No supraclavicular adenopathy.     Left upper body: No supraclavicular adenopathy.  Skin:    General: Skin is warm and dry.     Capillary Refill: Capillary refill takes less than 2 seconds.     Nails:  There is no clubbing.  Neurological:     General: No focal deficit present.     Mental Status: She is alert and oriented to person, place, and time.     GCS: GCS eye subscore is 4. GCS verbal subscore is 5. GCS motor subscore is 6.     Sensory: Sensation is intact.     Motor: Motor function is intact.     Coordination: Coordination is intact.     Gait: Gait is intact.     Deep Tendon Reflexes: Reflexes are normal and symmetric.  Psychiatric:        Attention and Perception: Attention normal.        Mood and Affect: Affect normal. Mood is depressed.        Speech: Speech normal.        Behavior: Behavior normal. Behavior is cooperative.        Thought Content: Thought content normal.        Cognition and Memory: Cognition and memory normal.        Judgment: Judgment normal.      Results for orders placed or performed in visit on 11/11/22  CBC with Differential/Platelet  Result Value Ref Range   WBC 4.5 3.4 - 10.8 x10E3/uL   RBC 4.68 3.77 - 5.28 x10E6/uL   Hemoglobin 14.1 11.1 - 15.9 g/dL   Hematocrit 11.9 14.7 - 46.6 %   MCV 94 79 - 97 fL   MCH 30.1 26.6 - 33.0 pg   MCHC 32.1 31.5 - 35.7 g/dL   RDW 82.9 56.2 - 13.0 %   Platelets 459 (H) 150 - 450 x10E3/uL   Neutrophils 59 Not Estab. %   Lymphs 30 Not Estab. %   Monocytes 7 Not Estab. %   Eos 3 Not Estab. %   Basos 1 Not Estab. %   Neutrophils Absolute 2.6 1.4 - 7.0 x10E3/uL   Lymphocytes Absolute 1.4 0.7 - 3.1 x10E3/uL   Monocytes Absolute 0.3 0.1 - 0.9 x10E3/uL   EOS (ABSOLUTE) 0.1 0.0 - 0.4 x10E3/uL   Basophils Absolute 0.1 0.0 - 0.2 x10E3/uL   Immature Granulocytes 0 Not Estab. %   Immature Grans (Abs) 0.0 0.0 - 0.1 x10E3/uL  CMP14+EGFR  Result Value Ref Range   Glucose 77 70 - 99 mg/dL   BUN 9 6 - 24 mg/dL   Creatinine, Ser 8.65 (H) 0.57 - 1.00 mg/dL   eGFR 59 (L) >78 IO/NGE/9.52   BUN/Creatinine Ratio 8 (L) 9 - 23   Sodium 141 134 - 144 mmol/L   Potassium 3.6 3.5 - 5.2 mmol/L   Chloride 104 96 - 106 mmol/L    CO2 24 20 - 29 mmol/L   Calcium 10.2 8.7 - 10.2 mg/dL   Total Protein 7.0 6.0 - 8.5 g/dL  Albumin 4.7 3.8 - 4.9 g/dL   Globulin, Total 2.3 1.5 - 4.5 g/dL   Bilirubin Total 0.6 0.0 - 1.2 mg/dL   Alkaline Phosphatase 67 44 - 121 IU/L   AST 20 0 - 40 IU/L   ALT 10 0 - 32 IU/L  Hemoglobin A1c  Result Value Ref Range   Hgb A1c MFr Bld 5.4 4.8 - 5.6 %   Est. average glucose Bld gHb Est-mCnc 108 mg/dL  Lipid panel  Result Value Ref Range   Cholesterol, Total 212 (H) 100 - 199 mg/dL   Triglycerides 55 0 - 149 mg/dL   HDL 77 >40 mg/dL   VLDL Cholesterol Cal 10 5 - 40 mg/dL   LDL Chol Calc (NIH) 981 (H) 0 - 99 mg/dL   Chol/HDL Ratio 2.8 0.0 - 4.4 ratio  TSH  Result Value Ref Range   TSH 1.190 0.450 - 4.500 uIU/mL       Assessment & Plan:   Problem List Items Addressed This Visit     Focal epilepsy (HCC)    2 recent seizures reported despite 1750mg  of Keppra daily. Her stress levels have been exceptionally high, which may have been contributing. I am hopeful that she will feel some relief now that she is not working.  -Continue to follow closely with neurology -Continue current medications      Relevant Orders   CBC with Differential/Platelet (Completed)   CMP14+EGFR (Completed)   Hemoglobin A1c (Completed)   Lipid panel (Completed)   TSH (Completed)   Migraine    Reports of constant headache, previously diagnosed with ocular migraine. -Neuro-ophthalmologist appointment scheduled for next week. -Request report from neuro-ophthalmologist to monitor progress.      Relevant Medications   verapamil (CALAN-SR) 240 MG CR tablet   Other Relevant Orders   CBC with Differential/Platelet (Completed)   CMP14+EGFR (Completed)   Hemoglobin A1c (Completed)   Lipid panel (Completed)   TSH (Completed)   Episode of recurrent major depressive disorder [facility])    Currently followed with psychiatry and psychology for ongoing management. She appears depressed today, but no alarm symptoms  are present at this time. Recommend continued psychiatric management and monitoring.       Relevant Orders   CBC with Differential/Platelet (Completed)   CMP14+EGFR (Completed)   Hemoglobin A1c (Completed)   Lipid panel (Completed)   TSH (Completed)   Primary hypertension    Chronic. BP slightly elevated today. No alarm symptoms present at this time.  Medication: verapamil, taking as prescribed. Goal blood pressure less than less than 130/80. Currently is not followed with cardiology.  Plan: current treatment plan is effective, no change in therapy, orders and follow up as documented in EpicCare.  Refills have been provided today. Labs today to check electrolytes and kidney function.  Will make changes to plan of care as necessary based on lab results.  Plan to follow-up in 12 months.       Relevant Medications   verapamil (CALAN-SR) 240 MG CR tablet   Other Relevant Orders   CBC with Differential/Platelet (Completed)   CMP14+EGFR (Completed)   Hemoglobin A1c (Completed)   Lipid panel (Completed)   TSH (Completed)   Encounter for annual physical exam - Primary    CPE completed today. Review of HM activities and recommendations discussed and provided on AVS. Anticipatory guidance, diet, and exercise recommendations provided. Medications, allergies, and hx reviewed and updated as necessary. Orders placed as listed below.  Plan: - Labs ordered. Will make changes as necessary  based on results.  - I will review these results and send recommendations via MyChart or a telephone call.  - F/U with CPE in 1 year or sooner for acute/chronic health needs as directed.        Relevant Orders   CBC with Differential/Platelet (Completed)   CMP14+EGFR (Completed)   Hemoglobin A1c (Completed)   Lipid panel (Completed)   TSH (Completed)   Knee pain    Reports of right knee pain, with an appointment scheduled with an orthopedist. -Orthopedist appointment scheduled for tomorrow. -Request  report from orthopedist to monitor progress.      Other Visit Diagnoses     Weight gain       Relevant Orders   CBC with Differential/Platelet (Completed)   CMP14+EGFR (Completed)   Hemoglobin A1c (Completed)   Lipid panel (Completed)   TSH (Completed)   Generalized anxiety disorder             Follow up plan: Return in about 1 year (around 11/11/2023) for CPE.  NEXT PREVENTATIVE PHYSICAL DUE IN 1 YEAR.  PATIENT COUNSELING PROVIDED FOR ALL ADULT PATIENTS: A well balanced diet low in saturated fats, cholesterol, and moderation in carbohydrates.  This can be as simple as monitoring portion sizes and cutting back on sugary beverages such as soda and juice to start with.    Daily water consumption of at least 64 ounces.  Physical activity at least 180 minutes per week.  If just starting out, start 10 minutes a day and work your way up.   This can be as simple as taking the stairs instead of the elevator and walking 2-3 laps around the office  purposefully every day.   STD protection, partner selection, and regular testing if high risk.  Limited consumption of alcoholic beverages if alcohol is consumed. For men, I recommend no more than 14 alcoholic beverages per week, spread out throughout the week (max 2 per day). Avoid "binge" drinking or consuming large quantities of alcohol in one setting.  Please let me know if you feel you may need help with reduction or quitting alcohol consumption.   Avoidance of nicotine, if used. Please let me know if you feel you may need help with reduction or quitting nicotine use.   Daily mental health attention. This can be in the form of 5 minute daily meditation, prayer, journaling, yoga, reflection, etc.  Purposeful attention to your emotions and mental state can significantly improve your overall wellbeing  and  Health.  Please know that I am here to help you with all of your health care goals and am happy to work with you to find a  solution that works best for you.  The greatest advice I have received with any changes in life are to take it one step at a time, that even means if all you can focus on is the next 60 seconds, then do that and celebrate your victories.  With any changes in life, you will have set backs, and that is OK. The important thing to remember is, if you have a set back, it is not a failure, it is an opportunity to try again! Screening Testing Mammogram Every 1 -2 years based on history and risk factors Starting at age 70 Pap Smear Ages 21-39 every 3 years Ages 14-65 every 5 years with HPV testing More frequent testing may be required based on results and history Colon Cancer Screening Every 1-10 years based on test performed, risk factors, and history Starting  at age 40 Bone Density Screening Every 2-10 years based on history Starting at age 59 for women Recommendations for men differ based on medication usage, history, and risk factors AAA Screening One time ultrasound Men 5-51 years old who have every smoked Lung Cancer Screening Low Dose Lung CT every 12 months Age 32-80 years with a 30 pack-year smoking history who still smoke or who have quit within the last 15 years   Screening Labs Routine  Labs: Complete Blood Count (CBC), Complete Metabolic Panel (CMP), Cholesterol (Lipid Panel) Every 6-12 months based on history and medications May be recommended more frequently based on current conditions or previous results Hemoglobin A1c Lab Every 3-12 months based on history and previous results Starting at age 40 or earlier with diagnosis of diabetes, high cholesterol, BMI >26, and/or risk factors Frequent monitoring for patients with diabetes to ensure blood sugar control Thyroid Panel (TSH) Every 6 months based on history, symptoms, and risk factors May be repeated more often if on medication HIV One time testing for all patients 15 and older May be repeated more frequently for  patients with increased risk factors or exposure Hepatitis C One time testing for all patients 37 and older May be repeated more frequently for patients with increased risk factors or exposure Gonorrhea, Chlamydia Every 12 months for all sexually active persons 13-24 years Additional monitoring may be recommended for those who are considered high risk or who have symptoms Every 12 months for any woman on birth control, regardless of sexual activity PSA Men 19-46 years old with risk factors Additional screening may be recommended from age 19-69 based on risk factors, symptoms, and history  Vaccine Recommendations Tetanus Booster All adults every 10 years Flu Vaccine All patients 6 months and older every year COVID Vaccine All patients 12 years and older Initial dosing with booster May recommend additional booster based on age and health history HPV Vaccine 2 doses all patients age 70-26 Dosing may be considered for patients over 26 Shingles Vaccine (Shingrix) 2 doses all adults 55 years and older Pneumonia (Pneumovax 44) All adults 65 years and older May recommend earlier dosing based on health history One year apart from Prevnar 33 Pneumonia (Prevnar 66) All adults 65 years and older Dosed 1 year after Pneumovax 23 Pneumonia (Prevnar 20) One time alternative to the two dosing of 13 and 23 For all adults with initial dose of 23, 20 is recommended 1 year later For all adults with initial dose of 13, 23 is still recommended as second option 1 year later

## 2022-11-11 NOTE — Patient Instructions (Signed)
WEIGHT LOSS PLANNING This may help make a difference   For best management of weight, it is vital to balance intake versus output. This means the number of calories burned per day must be less than the calories you take in with food and drink.   I recommend trying to follow a diet with the following: Calories: 1200-1500 calories per day Carbohydrates: 150-180 grams of carbohydrates per day  Why: Gives your body enough "quick fuel" for cells to maintain normal function without sending them into starvation mode.  Protein: At least 90 grams of protein per day- 30 grams with each meal Why: Protein takes longer and uses more energy than carbohydrates to break down for fuel. The carbohydrates in your meals serves as quick energy sources and proteins help use some of that extra quick energy to break down to produce long term energy. This helps you not feel hungry as quickly and protein breakdown burns calories.  Water: Drink AT LEAST 64 ounces of water per day  Why: Water is essential to healthy metabolism. Water helps to fill the stomach and keep you fuller longer. Water is required for healthy digestion and filtering of waste in the body.  Fat: Limit fats in your diet- when choosing fats, choose foods with lower fats content such as lean meats (chicken, fish, [place]).  Why: Increased fat intake leads to storage "for later". Once you burn your carbohydrate energy, your body goes into fat and protein breakdown mode to help you loose weight.  Cholesterol: Fats and oils that are LIQUID at room temperature are best. Choose vegetable oils (olive oil, avocado oil, nuts). Avoid fats that are SOLID at room temperature (animal fats, processed meats). Healthy fats are often found in whole grains, beans, nuts, seeds, and berries.  Why: Elevated cholesterol levels lead to build up of cholesterol on the inside of your blood vessels. This will eventually cause the blood vessels to become hard and can lead to high blood  pressure and damage to your organs. When the blood flow is reduced, but the pressure is high from cholesterol buildup, parts of the cholesterol can break off and form clots that can go to the brain or heart leading to a stroke or heart attack.  Fiber: Increase amount of SOLUBLE the fiber in your diet. This helps to fill you up, lowers cholesterol, and helps with digestion. Some foods high in soluble fiber are oats, peas, beans, apples, carrots, barley, and citrus fruits.   Why: Fiber fills you up, helps remove excess cholesterol, and aids in healthy digestion which are all very important in weight management.   I recommend the following as a minimum activity routine: Purposeful walk or other physical activity at least 20 minutes every single day. This means purposefully taking a walk, jog, bike, swim, treadmill, elliptical, dance, etc.  This activity should be ABOVE your normal daily activities, such as walking at work. Goal exercise should be at least 150 minutes a week- work your way up to this.   Heart Rate: Your maximum exercise heart rate should be 220 - Your Age in Years. When exercising, get your heart rate up, but avoid going over the maximum targeted heart rate.  60-70% of your maximum heart rate is where you tend to burn the most fat. To find this number:  220 - Age In Years= Max HR  Max HR x 0.6 (or 0.7) = Fat Burning HR The Fat Burning HR is your goal heart rate while working out to burn  the most fat.  NEVER exercise to the point your feel lightheaded, weak, nauseated, dizzy. If you experience ANY of these symptoms- STOP exercise! Allow yourself to cool down and your heart rate to come down. Then restart slower next time.  If at ANY TIME you feel chest pain or chest pressure during exercise, STOP IMMEDIATELY and seek medical attention.

## 2022-11-12 ENCOUNTER — Ambulatory Visit: Payer: Medicare (Managed Care) | Admitting: Family Medicine

## 2022-11-12 LAB — CMP14+EGFR
ALT: 10 [IU]/L (ref 0–32)
AST: 20 [IU]/L (ref 0–40)
Albumin: 4.7 g/dL (ref 3.8–4.9)
Alkaline Phosphatase: 67 [IU]/L (ref 44–121)
BUN/Creatinine Ratio: 8 — ABNORMAL LOW (ref 9–23)
BUN: 9 mg/dL (ref 6–24)
Bilirubin Total: 0.6 mg/dL (ref 0.0–1.2)
CO2: 24 mmol/L (ref 20–29)
Calcium: 10.2 mg/dL (ref 8.7–10.2)
Chloride: 104 mmol/L (ref 96–106)
Creatinine, Ser: 1.09 mg/dL — ABNORMAL HIGH (ref 0.57–1.00)
Globulin, Total: 2.3 g/dL (ref 1.5–4.5)
Glucose: 77 mg/dL (ref 70–99)
Potassium: 3.6 mmol/L (ref 3.5–5.2)
Sodium: 141 mmol/L (ref 134–144)
Total Protein: 7 g/dL (ref 6.0–8.5)
eGFR: 59 mL/min/{1.73_m2} — ABNORMAL LOW (ref >59)

## 2022-11-12 LAB — CBC WITH DIFFERENTIAL/PLATELET
Basophils Absolute: 0.1 10*3/uL (ref 0.0–0.2)
Basos: 1 %
EOS (ABSOLUTE): 0.1 10*3/uL (ref 0.0–0.4)
Eos: 3 %
Hematocrit: 43.9 % (ref 34.0–46.6)
Hemoglobin: 14.1 g/dL (ref 11.1–15.9)
Immature Grans (Abs): 0 10*3/uL (ref 0.0–0.1)
Immature Granulocytes: 0 %
Lymphocytes Absolute: 1.4 10*3/uL (ref 0.7–3.1)
Lymphs: 30 %
MCH: 30.1 pg (ref 26.6–33.0)
MCHC: 32.1 g/dL (ref 31.5–35.7)
MCV: 94 fL (ref 79–97)
Monocytes Absolute: 0.3 10*3/uL (ref 0.1–0.9)
Monocytes: 7 %
Neutrophils Absolute: 2.6 10*3/uL (ref 1.4–7.0)
Neutrophils: 59 %
Platelets: 459 10*3/uL — ABNORMAL HIGH (ref 150–450)
RBC: 4.68 x10E6/uL (ref 3.77–5.28)
RDW: 12.5 % (ref 11.7–15.4)
WBC: 4.5 10*3/uL (ref 3.4–10.8)

## 2022-11-12 LAB — LIPID PANEL
Chol/HDL Ratio: 2.8 {ratio} (ref 0.0–4.4)
Cholesterol, Total: 212 mg/dL — ABNORMAL HIGH (ref 100–199)
HDL: 77 mg/dL (ref >39)
LDL Chol Calc (NIH): 125 mg/dL — ABNORMAL HIGH (ref 0–99)
Triglycerides: 55 mg/dL (ref 0–149)
VLDL Cholesterol Cal: 10 mg/dL (ref 5–40)

## 2022-11-12 LAB — HEMOGLOBIN A1C
Est. average glucose Bld gHb Est-mCnc: 108 mg/dL
Hgb A1c MFr Bld: 5.4 % (ref 4.8–5.6)

## 2022-11-12 LAB — TSH: TSH: 1.19 u[IU]/mL (ref 0.450–4.500)

## 2022-11-16 NOTE — Progress Notes (Signed)
[name] [address] [place] [address] Phone: [phone] Subjective:   INadine Counts, am serving as a scribe for Dr. Antoine Primas.  I'm seeing this patient by the request  of:  Early, Sung Amabile, NP  CC: Right knee pain, low back pain  [id number]  Claudia Elliott is a 57 y.o. female coming in with complaint of R knee and LBP. Patient states  R knee pain for over 7 years ago and had arthroscopic surgery at [facility]. No home treatment. Crunching and hurts when putting weight on knee. Now it's causing lower back to hurt. Has been doing acupuncture.       Past Medical History:  Diagnosis Date   Abnormal Pap smear    repeat pap   Acute gastritis without hemorrhage 03/29/2022   Allergy    Breast asymmetry in female 04/09/2022   Bruising tendency 04/02/2021   COVID-19 03/05/2021   Depression    Edema 04/02/2021   Fibroid    Heart murmur    Hirsutism 10/11/2018   Irritant dermatitis 10/11/2018   Migraine    Pain involving joint of finger of right hand 07/07/2021   Panic attacks    Primary hypertension 03/05/2021   Seizures (HCC)    x 3 , last seizure 06/23/14   Strep throat 04/02/2021   Subacute cough 03/20/2021   Weight gain, abnormal 03/05/2021   Past Surgical History:  Procedure Laterality Date   APPENDECTOMY     BREAST BIOPSY Right 2013   BREAST CYST ASPIRATION Right 12/2012   KNEE SURGERY Right 07/19/2017   Social History   Socioeconomic History   Marital status: Divorced    Spouse name: Not on file   Number of children: 1   Years of education: MA   Highest education level: Not on file  Occupational History   Occupation: [employer]    Employer: [employer]  Tobacco Use   Smoking status: Former    Types: Cigarettes   Smokeless tobacco: Never   Tobacco comments:    Quit over 20 years ago. Only smoked for less than a year  Vaping Use   Vaping status: Never Used  Substance and Sexual  Activity   Alcohol use: Yes    Alcohol/week: 2.0 standard drinks of alcohol    Types: 2 Glasses of wine per week   Drug use: No   Sexual activity: Not Currently    Birth control/protection: Post-menopausal  Other Topics Concern   Not on file  Social History Narrative   Patient lives at home with son.   Patient is right handed.   Patient has a masters degree   Caffeine Use: 1 cup of tea daily   Social Determinants of Corporate investment banker Strain: Not on file  Food Insecurity: Not on file  Transportation Needs: Not on file  Physical Activity: Not on file  Stress: Not on file  Social Connections: Not on file   Allergies  Allergen Reactions   Dilaudid [Hydromorphone] Hives    Out of it, takes a long time to come back   Percocet [Oxycodone-Acetaminophen] Itching   Family History  Problem Relation Age of Onset   Diabetes Maternal Grandmother    Hypertension Maternal Grandmother    Hypertension Mother      Current Outpatient Medications (Cardiovascular):    verapamil (CALAN-SR) 240 MG CR tablet, Take 1 tablet (240 mg total) by mouth at bedtime.   Current Outpatient Medications (Analgesics):  acetaminophen (TYLENOL) 500 MG tablet, Take 2 tablets (1,000 mg total) by mouth every 8 (eight) hours as needed. Alternate with ibuprofen.   Current Outpatient Medications (Other):    Blood Pressure Monitoring (BLOOD PRESSURE KIT) KIT, For monitoring BP. ICD-10 I10   buPROPion (WELLBUTRIN XL) 300 MG 24 hr tablet, Take 300 mg by mouth daily.   busPIRone (BUSPAR) 30 MG tablet, Take 60 mg by mouth daily.   clonazePAM (KLONOPIN) 1 MG tablet, Take 1 mg by mouth at bedtime as needed.   DULoxetine (CYMBALTA) 60 MG capsule, Take 2 capsules by mouth daily.   hydroquinone 4 % cream, Apply small pea-sized amount nightly to affected dark spots.   levETIRAcetam (KEPPRA) 1000 MG tablet, Take 1 tablet (1,000 mg total) by mouth daily.   levETIRAcetam (KEPPRA) 750 MG tablet, Take 1 tablet  (750 mg total) by mouth daily.   loperamide (IMODIUM A-D) 2 MG tablet, Take 1 tablet (2 mg total) by mouth 4 (four) times daily as needed for diarrhea or loose stools.   Multiple Vitamins-Minerals (MULTIVITAMIN GUMMIES ADULT) CHEW, Take 2 gummies by mouth daily.   NONFORMULARY OR COMPOUNDED ITEM, Vitamin E vaginal cream 200u/ml.  One ml two to three times weekly.  Disp: 3 month supply.   Sulfacetamide Sodium, Acne, 10 % LOTN, Apply to face each am.   traZODone (DESYREL) 50 MG tablet, Take 1 tablet (50 mg total) by mouth at bedtime.   zonisamide (ZONEGRAN) 100 MG capsule, Take by mouth.   Reviewed prior external information including notes and imaging from  primary care provider As well as notes that were available from care everywhere and other healthcare systems.  Past medical history, social, surgical and family history all reviewed in electronic medical record.  No pertanent information unless stated regarding to the chief complaint.   Review of Systems:  No headache, visual changes, nausea, vomiting, diarrhea, constipation, dizziness, abdominal pain, skin rash, fevers, chills, night sweats, weight loss, swollen lymph nodes, body aches, joint swelling, chest pain, shortness of breath, mood changes. POSITIVE muscle aches  Objective  Blood pressure 112/74, pulse 79, height 5\' 6"  (1.676 m), weight 162 lb (73.5 kg), last menstrual period 06/13/2014, SpO2 96%.   General: No apparent distress alert and oriented x3 mood and affect normal, dressed appropriately.  HEENT: Pupils equal, extraocular movements intact  Respiratory: Patient's speak in full sentences and does not appear short of breath  Cardiovascular: No lower extremity edema, non tender, no erythema  Right knee exam does have significant instability noted of the right knee.  Abnormality in significant laxity noted of the MCL.  Does have what appears to be crepitus with range of motion testing.  Antalgic gait noted. ight knee exam does  show the patient does have instability in that noted.  No abnormal thigh to calf ratio noted.  Patient is ambulatory Limited muscular skeletal ultrasound was performed and interpreted by Antoine Primas, M  Limited ultrasound shows that there is some postsurgical changes noted after an arthroscopic procedure.  Does have narrowing of the patellofemoral and medial joint space. Impression: Knee arthritis    Impression and Recommendations:    The above documentation has been reviewed and is accurate and complete Judi Saa, DO

## 2022-11-17 ENCOUNTER — Encounter: Payer: Self-pay | Admitting: Nurse Practitioner

## 2022-11-17 ENCOUNTER — Ambulatory Visit: Payer: Medicare (Managed Care) | Admitting: Family Medicine

## 2022-11-17 DIAGNOSIS — Z0000 Encounter for general adult medical examination without abnormal findings: Secondary | ICD-10-CM | POA: Insufficient documentation

## 2022-11-17 DIAGNOSIS — M25569 Pain in unspecified knee: Secondary | ICD-10-CM | POA: Insufficient documentation

## 2022-11-17 NOTE — Assessment & Plan Note (Signed)

## 2022-11-17 NOTE — Assessment & Plan Note (Signed)
Reports of high stress levels due to personal and family circumstances. -Continue psychiatric care with Dr. Madaline Guthrie. -Continue therapy sessions.

## 2022-11-17 NOTE — Assessment & Plan Note (Signed)
Currently followed with psychiatry and psychology for ongoing management. She appears depressed today, but no alarm symptoms are present at this time. Recommend continued psychiatric management and monitoring.

## 2022-11-17 NOTE — Assessment & Plan Note (Signed)
2 recent seizures reported despite 1750mg  of Keppra daily. Her stress levels have been exceptionally high, which may have been contributing. I am hopeful that she will feel some relief now that she is not working.  -Continue to follow closely with neurology -Continue current medications

## 2022-11-17 NOTE — Assessment & Plan Note (Signed)
Reports of constant headache, previously diagnosed with ocular migraine. -Neuro-ophthalmologist appointment scheduled for next week. -Request report from neuro-ophthalmologist to monitor progress.

## 2022-11-17 NOTE — Assessment & Plan Note (Signed)
Reports of right knee pain, with an appointment scheduled with an orthopedist. -Orthopedist appointment scheduled for tomorrow. -Request report from orthopedist to monitor progress.

## 2022-11-17 NOTE — Assessment & Plan Note (Addendum)
Chronic. BP slightly elevated today. No alarm symptoms present at this time.  Medication: verapamil, taking as prescribed. Goal blood pressure less than less than 130/80. Currently is not followed with cardiology.  Plan: current treatment plan is effective, no change in therapy, orders and follow up as documented in EpicCare.  Refills have been provided today. Labs today to check electrolytes and kidney function.  Will make changes to plan of care as necessary based on lab results.  Plan to follow-up in 12 months.

## 2022-11-17 NOTE — Assessment & Plan Note (Signed)
>>  ASSESSMENT AND PLAN FOR GENERALIZED ANXIETY DISORDER WRITTEN ON 11/17/2022  5:02 PM BY Natale Thoma E, NP  Reports of high stress levels due to personal and family circumstances. -Continue psychiatric care with Dr. Madaline Guthrie. -Continue therapy sessions.

## 2022-11-23 ENCOUNTER — Other Ambulatory Visit: Payer: Self-pay

## 2022-11-23 ENCOUNTER — Ambulatory Visit: Payer: Medicare (Managed Care) | Admitting: Family Medicine

## 2022-11-23 ENCOUNTER — Encounter: Payer: Self-pay | Admitting: Family Medicine

## 2022-11-23 VITALS — BP 112/74 | HR 79 | Ht 66.0 in | Wt 162.0 lb

## 2022-11-23 DIAGNOSIS — M1711 Unilateral primary osteoarthritis, right knee: Secondary | ICD-10-CM | POA: Diagnosis not present

## 2022-11-23 DIAGNOSIS — G8929 Other chronic pain: Secondary | ICD-10-CM

## 2022-11-23 DIAGNOSIS — M25561 Pain in right knee: Secondary | ICD-10-CM

## 2022-11-23 NOTE — Assessment & Plan Note (Signed)
Patient does have significant arthritic changes noted.  Wants to hold on any type of replacement in the near future with her being the primary caregiving for her mother.  We discussed with patient about an OA stability brace secondary to the instability of the knee, the abnormal thigh to calf ratio, as well as patient continuing to be ambulatory.  I do think that this will be significantly helpful.  We discussed with patient to avoid the steroid injection when possible secondary to the glaucoma.  Will see if patient is a candidate for viscosupplementation.  Follow-up with me again 6 to 8 weeks to see how patient is responding.

## 2022-11-23 NOTE — Patient Instructions (Signed)
Good to see you! Gel approval I[employer] rep will contact you Vitamin D3 2000IU daily  Turmeric 500mg  daily  Tart cherry extract any dose at night 2400mg  See you again in 5 weeks

## 2022-11-30 ENCOUNTER — Other Ambulatory Visit: Payer: Self-pay

## 2022-11-30 ENCOUNTER — Telehealth: Payer: Self-pay

## 2022-11-30 DIAGNOSIS — M25561 Pain in right knee: Secondary | ICD-10-CM

## 2022-11-30 NOTE — Telephone Encounter (Signed)
Patients insurance called they need clinical information to move any further with the Pre certification process. They are asking for notes and imaging. They stated x ray but I was going to try and just use the [place] that was done at the visit, however I need notes added to that. And it may be denied because patient does not have an xray.

## 2022-12-14 ENCOUNTER — Telehealth: Payer: Self-pay

## 2022-12-14 NOTE — Telephone Encounter (Signed)
FYI  Office called patient twice and a mychart was sent to patient to come in and get her x rays. Patient has not done so, therefore I have not been able to move forward with the gel injection pre certification. The one I had started was denied because we didn't have x rays.

## 2022-12-16 NOTE — Telephone Encounter (Signed)
Pt called, will come in next week for xrays.

## 2022-12-21 ENCOUNTER — Ambulatory Visit (INDEPENDENT_AMBULATORY_CARE_PROVIDER_SITE_OTHER): Payer: Medicare (Managed Care)

## 2022-12-21 DIAGNOSIS — M25561 Pain in right knee: Secondary | ICD-10-CM | POA: Diagnosis not present

## 2023-01-03 ENCOUNTER — Other Ambulatory Visit: Payer: Self-pay

## 2023-01-03 ENCOUNTER — Telehealth: Payer: Self-pay | Admitting: Nurse Practitioner

## 2023-01-03 DIAGNOSIS — I10 Essential (primary) hypertension: Secondary | ICD-10-CM

## 2023-01-03 MED ORDER — VERAPAMIL HCL ER 240 MG PO TBCR: 240.0000 mg | EXTENDED_RELEASE_TABLET | Freq: Every day | ORAL | 1 refills | Status: DC

## 2023-01-03 NOTE — Telephone Encounter (Signed)
Pt states she needed her Verapamil refills to go to [facility],  please send 90 day refills there.

## 2023-01-15 NOTE — Telephone Encounter (Signed)
done

## 2023-01-25 ENCOUNTER — Ambulatory Visit: Payer: Medicare (Managed Care)

## 2023-01-25 VITALS — BP 142/90 | HR 97 | Temp 97.7°F | Ht 66.0 in | Wt 165.3 lb

## 2023-01-25 DIAGNOSIS — Z0000 Encounter for general adult medical examination without abnormal findings: Secondary | ICD-10-CM | POA: Diagnosis not present

## 2023-01-25 NOTE — Patient Instructions (Signed)
Ms. Moninger , Thank you for taking time to come for your Medicare Wellness Visit. I appreciate your ongoing commitment to your health goals. Please review the following plan we discussed and let me know if I can assist you in the future.   Referrals/Orders/Follow-Ups/Clinician Recommendations: none  This is a list of the screening recommended for you and due dates:  Health Maintenance  Topic Date Due   Zoster (Shingles) Vaccine (1 of 2) 02/17/2023*   Medicare Annual Wellness Visit  01/25/2024   Mammogram  06/13/2024   Pap with HPV screening  02/16/2027   Colon Cancer Screening  04/28/2030   DTaP/Tdap/Td vaccine (3 - Tdap) 09/23/2030   Hepatitis C Screening  Completed   HIV Screening  Completed   HPV Vaccine  Aged Out   Flu Shot  Discontinued   COVID-19 Vaccine  Discontinued  *Topic was postponed. The date shown is not the original due date.    Advanced directives: (Declined) Advance directive discussed with you today. Even though you declined this today, please call our office should you change your mind, and we can give you the proper paperwork for you to fill out.  Next Medicare Annual Wellness Visit scheduled for next year: Yes  insert Preventive Care Attachment Reference

## 2023-01-25 NOTE — Progress Notes (Signed)
Subjective:   Claudia Elliott is a 57 y.o. female who presents for an Initial Medicare Annual Wellness Visit.  Visit Complete: In person    Cardiac Risk Factors include: hypertension     Objective:    Today's Vitals   01/25/23 1444 01/25/23 1520  BP: (!) 150/90 (!) 142/90  Pulse: 97   Temp: 97.7 F (36.5 C)   TempSrc: Oral   SpO2: 97%   Weight: 165 lb 4.8 oz (75 kg)   Height: 5\' 6"  (1.676 m)    Body mass index is 26.68 kg/m.     01/25/2023    2:59 PM 03/06/2021    1:05 PM 05/28/2019    2:20 PM 03/02/2016    2:57 PM 08/07/2014    1:53 PM 11/02/2013   10:40 AM 10/28/2013    5:05 PM  Advanced Directives  Does Patient Have a Medical Advance Directive? No No Yes No No No No  Type of Surveyor, minerals;Living will      Would patient like information on creating a medical advance directive? No - Patient declined No - Guardian declined No - Patient declined  Yes - Educational materials given No - patient declined information No - patient declined information    Current Medications (verified) Outpatient Encounter Medications as of 01/25/2023  Medication Sig   acetaminophen (TYLENOL) 500 MG tablet Take 2 tablets (1,000 mg total) by mouth every 8 (eight) hours as needed. Alternate with ibuprofen.   Blood Pressure Monitoring (BLOOD PRESSURE KIT) KIT For monitoring BP. ICD-10 I10   buPROPion (WELLBUTRIN XL) 300 MG 24 hr tablet Take 300 mg by mouth daily.   busPIRone (BUSPAR) 30 MG tablet Take 60 mg by mouth daily.   clonazePAM (KLONOPIN) 1 MG tablet Take 1 mg by mouth at bedtime as needed.   DULoxetine (CYMBALTA) 60 MG capsule Take 2 capsules by mouth daily.   hydroquinone 4 % cream Apply small pea-sized amount nightly to affected dark spots.   levETIRAcetam (KEPPRA) 1000 MG tablet Take 1 tablet (1,000 mg total) by mouth daily.   levETIRAcetam (KEPPRA) 750 MG tablet Take 1 tablet (750 mg total) by mouth daily.   loperamide (IMODIUM A-D) 2 MG tablet  Take 1 tablet (2 mg total) by mouth 4 (four) times daily as needed for diarrhea or loose stools.   Multiple Vitamins-Minerals (MULTIVITAMIN GUMMIES ADULT) CHEW Take 2 gummies by mouth daily.   NONFORMULARY OR COMPOUNDED ITEM Vitamin E vaginal cream 200u/ml.  One ml two to three times weekly.  Disp: 3 month supply.   Sulfacetamide Sodium, Acne, 10 % LOTN Apply to face each am.   traZODone (DESYREL) 50 MG tablet Take 1 tablet (50 mg total) by mouth at bedtime.   verapamil (CALAN-SR) 240 MG CR tablet Take 1 tablet (240 mg total) by mouth at bedtime.   zonisamide (ZONEGRAN) 100 MG capsule Take by mouth.   No facility-administered encounter medications on file as of 01/25/2023.    Allergies (verified) Dilaudid [hydromorphone] and Percocet [oxycodone-acetaminophen]   History: Past Medical History:  Diagnosis Date   Abnormal Pap smear    repeat pap   Acute gastritis without hemorrhage 03/29/2022   Allergy    Breast asymmetry in female 04/09/2022   Bruising tendency 04/02/2021   COVID-19 03/05/2021   Depression    Edema 04/02/2021   Fibroid    Heart murmur    Hirsutism 10/11/2018   Irritant dermatitis 10/11/2018   Migraine    Pain involving  joint of finger of right hand 07/07/2021   Panic attacks    Primary hypertension 03/05/2021   Seizures (HCC)    x 3 , last seizure 06/23/14   Strep throat 04/02/2021   Subacute cough 03/20/2021   Weight gain, abnormal 03/05/2021   Past Surgical History:  Procedure Laterality Date   APPENDECTOMY     BREAST BIOPSY Right 2013   BREAST CYST ASPIRATION Right 12/2012   KNEE SURGERY Right 07/19/2017   Family History  Problem Relation Age of Onset   Diabetes Maternal Grandmother    Hypertension Maternal Grandmother    Hypertension Mother    Social History   Socioeconomic History   Marital status: Divorced    Spouse name: Not on file   Number of children: 1   Years of education: MA   Highest education level: Not on file  Occupational  History   Occupation: [employer]    Employer: [employer]  Tobacco Use   Smoking status: Former    Types: Cigarettes   Smokeless tobacco: Never   Tobacco comments:    Quit over 20 years ago. Only smoked for less than a year  Vaping Use   Vaping status: Never Used  Substance and Sexual Activity   Alcohol use: Yes    Alcohol/week: 2.0 standard drinks of alcohol    Types: 2 Glasses of wine per week   Drug use: No   Sexual activity: Not Currently    Birth control/protection: Post-menopausal  Other Topics Concern   Not on file  Social History Narrative   Patient lives at home with son.   Patient is right handed.   Patient has a masters degree   Caffeine Use: 1 cup of tea daily   Social Drivers of Corporate investment banker Strain: Low Risk  (01/25/2023)   Overall Financial Resource Strain (CARDIA)    Difficulty of Paying Living Expenses: Not hard at all  Food Insecurity: Food Insecurity Present (01/25/2023)   Hunger Vital Sign    Worried About Running Out of Food in the Last Year: Sometimes true    Ran Out of Food in the Last Year: Sometimes true  Transportation Needs: No Transportation Needs (01/25/2023)   PRAPARE - Administrator, Civil Service (Medical): No    Lack of Transportation (Non-Medical): No  Physical Activity: Inactive (01/25/2023)   Exercise Vital Sign    Days of Exercise per Week: 0 days    Minutes of Exercise per Session: 0 min  Stress: Stress Concern Present (01/25/2023)   [employer] of Occupational Health - Occupational Stress Questionnaire    Feeling of Stress : Rather much  Social Connections: Moderately Isolated (01/25/2023)   Social Connection and Isolation Panel [NHANES]    Frequency of Communication with Friends and Family: More than three times a week    Frequency of Social Gatherings with Friends and Family: Never    Attends Religious Services: More than 4 times per year    Active Member of [employer] or  Organizations: No    Attends Banker Meetings: Never    Marital Status: Divorced    Tobacco Counseling Counseling given: Not Answered Tobacco comments: Quit over 20 years ago. Only smoked for less than a year   Clinical Intake:  Pre-visit preparation completed: Yes  Pain : No/denies pain     Nutritional Status: BMI 25 -29 Overweight Nutritional Risks: None Diabetes: No  How often do you need to have someone help you  when you read instructions, pamphlets, or other written materials from your doctor or pharmacy?: 1 - Never  Interpreter Needed?: No  Information entered by :: NAllen LPN   Activities of Daily Living    01/25/2023    2:46 PM  In your present state of health, do you have any difficulty performing the following activities:  Hearing? 0  Vision? 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 1  Comment memory with stress  Walking or climbing stairs? 1  Comment due to knees  Dressing or bathing? 0  Doing errands, shopping? 0  Preparing Food and eating ? N  Using the Toilet? N  In the past six months, have you accidently leaked urine? N  Do you have problems with loss of bowel control? N  Managing your Medications? N  Managing your Finances? N  Housekeeping or managing your Housekeeping? N    Patient Care Team: Early, Sung Amabile, NP as PCP - General (Nurse Practitioner) Jerene Bears, MD as Consulting Physician (Obstetrics and Gynecology)  Indicate any recent Medical Services you may have received from other than [facility] providers in the past year (date may be approximate).     Assessment:   This is a routine wellness examination for Hui.  Hearing/Vision screen Hearing Screening - Comments:: Denies hearing issues Vision Screening - Comments:: Blind in left eye, regular eye exams, [facility]   Goals Addressed             This Visit's Progress    Patient Stated       01/25/2023, wants to  manage self       Depression Screen    01/25/2023    3:02 PM 11/11/2022    2:51 PM 02/15/2022    2:46 PM 11/09/2021    3:05 PM 06/03/2021   11:21 AM 04/28/2021    2:04 PM 04/02/2021    7:35 AM  PHQ 2/9 Scores  PHQ - 2 Score 3 2 1 2  0 2 0  PHQ- 9 Score 9 11  13  6    Exception Documentation     Medical reason Medical reason     Fall Risk    01/25/2023    3:01 PM 11/11/2022    2:50 PM 02/15/2022    2:47 PM 12/03/2021    4:17 PM 11/09/2021    3:04 PM  Fall Risk   Falls in the past year? 1 1 1  0 1  Comment seizures      Number falls in past yr: 1 1 1  0 1  Comment  about 6     Injury with Fall? 1 1 0 0 0  Comment bruises      Risk for fall due to : History of fall(s);Medication side effect Impaired balance/gait  No Fall Risks History of fall(s)  Follow up Falls evaluation completed;Falls prevention discussed Falls evaluation completed  Falls evaluation completed;Education provided Falls evaluation completed;Education provided    MEDICARE RISK AT HOME: Medicare Risk at Home Any stairs in or around the home?: Yes If so, are there any without handrails?: No Home free of loose throw rugs in walkways, pet beds, electrical cords, etc?: Yes Adequate lighting in your home to reduce risk of falls?: Yes Life alert?: No Use of a cane, walker or w/c?: No Grab bars in the bathroom?: Yes Shower chair or bench in shower?: No Elevated toilet seat or a handicapped toilet?: No  TIMED UP AND GO:  Was the test  performed? Yes  Length of time to ambulate 10 feet: 5 sec Gait steady and fast without use of assistive device    Cognitive Function:        01/25/2023    3:07 PM  6CIT Screen  What Year? 0 points  What month? 0 points  What time? 0 points  Count back from 20 0 points  Months in reverse 0 points  Repeat phrase 0 points  Total Score 0 points    Immunizations Immunization History  Administered Date(s) Administered   PFIZER(Purple Top)SARS-COV-2 Vaccination 05/22/2019,  06/19/2019   Td 09/22/2020   Td (Adult),5 Lf Tetanus Toxid, Preservative Free 09/22/2020    TDAP status: Up to date  Flu Vaccine status: Declined, Education has been provided regarding the importance of this vaccine but patient still declined. Advised may receive this vaccine at local pharmacy or Health Dept. Aware to provide a copy of the vaccination record if obtained from local pharmacy or Health Dept. Verbalized acceptance and understanding.  Pneumococcal vaccine status: Up to date  Covid-19 vaccine status: Information provided on how to obtain vaccines.   Qualifies for Shingles Vaccine? Yes   Zostavax completed No   Shingrix Completed?: No.    Education has been provided regarding the importance of this vaccine. Patient has been advised to call insurance company to determine out of pocket expense if they have not yet received this vaccine. Advised may also receive vaccine at local pharmacy or Health Dept. Verbalized acceptance and understanding.  Screening Tests Health Maintenance  Topic Date Due   Zoster Vaccines- Shingrix (1 of 2) 02/17/2023 (Originally 06/23/1982)   Medicare Annual Wellness (AWV)  01/25/2024   MAMMOGRAM  06/13/2024   Cervical Cancer Screening (HPV/Pap Cotest)  02/16/2027   Colonoscopy  04/28/2030   DTaP/Tdap/Td (3 - Tdap) 09/23/2030   Hepatitis C Screening  Completed   HIV Screening  Completed   HPV VACCINES  Aged Out   INFLUENZA VACCINE  Discontinued   COVID-19 Vaccine  Discontinued    Health Maintenance  There are no preventive care reminders to display for this patient.   Colorectal cancer screening: Type of screening: Colonoscopy. Completed 04/27/2020. Repeat every 10 years  Mammogram status: Completed 06/14/2022. Repeat every year  Bone Density status: n/a  Lung Cancer Screening: (Low Dose CT Chest recommended if Age 62-80 years, 20 pack-year currently smoking OR have quit w/in 15years.) does not qualify.   Lung Cancer Screening Referral:  no  Additional Screening:  Hepatitis C Screening: does qualify; Completed 08/01/2014  Vision Screening: Recommended annual ophthalmology exams for early detection of glaucoma and other disorders of the eye. Is the patient up to date with their annual eye exam?  Yes  Who is the provider or what is the name of the office in which the patient attends annual eye exams? [facility] If pt is not established with a provider, would they like to be referred to a provider to establish care? No .   Dental Screening: Recommended annual dental exams for proper oral hygiene  Diabetic Foot Exam: n/a  Community Resource Referral / Chronic Care Management: CRR required this visit?  No   CCM required this visit?  No     Plan:     I have personally reviewed and noted the following in the patient's chart:   Medical and social history Use of alcohol, tobacco or illicit drugs  Current medications and supplements including opioid prescriptions. Patient is not currently taking opioid prescriptions. Functional ability and status  Nutritional status Physical activity Advanced directives List of other physicians Hospitalizations, surgeries, and ER visits in previous 12 months Vitals Screenings to include cognitive, depression, and falls Referrals and appointments  In addition, I have reviewed and discussed with patient certain preventive protocols, quality metrics, and best practice recommendations. A written personalized care plan for preventive services as well as general preventive health recommendations were provided to patient.     Barb Merino, LPN   [id number]   After Visit Summary: (In Person-Printed) AVS printed and given to the patient  Nurse Notes: none

## 2023-06-28 ENCOUNTER — Ambulatory Visit: Payer: Self-pay

## 2023-06-28 NOTE — Telephone Encounter (Signed)
 Chief Complaint:  -Hoarse voice   Symptoms:  -Nose bleeding -Coughing- Productive ( yellow phlegm) -Intermittently diaphoretic ( unable to check temp) - Fatigue ( new for her)  Frequency:  -Onset: 3 days ago after eat a shrimp taco salad    Patient denies  -Chest pain, weakness of extremities, SOB,   Disposition: [ ] ED /[ X]Urgent Care (no appt availability in office) / [ ] Appointment(In office/virtual)/ [ ]  [facility]/ [ ] Home Care/ [ ] Refused Recommended Disposition /[ ] [facility]/ [ ]  Follow-up with PCP   Additional Notes:  Attempted to schedule an appt , no availability noted. Referred pt to local UC/ED. Patient agreed with plan. Patient educated on pertinent s/s that would warrant emergent help/call 911/ ED/UC.  Patient verbalized understanding. No additional questions/concerns noted during the time of the call.    Complete triage note below:   Copied from CRM [id number]. Topic: Clinical - Medical Advice >> Jun 28, 2023  2:30 PM Yolanda T wrote: Reason for CRM: patient is hoarse and coughing up green/yellow mucus. No appts until next week so if provider could f/u with patient for medication or sooner appt Reason for Disposition  [1] Continuous (nonstop) coughing interferes with work or school AND [2] no improvement using cough treatment per Care Advice  Answer Assessment - Initial Assessment Questions 1. ONSET: "When did the cough begin?"      3 days ago- Ate a shrimp taco salad and this started happened.   2. SEVERITY: "How bad is the cough today?"      ------ Continuous- wakes her up out of her sleep.    3. SPUTUM: "Describe the color of your sputum" (none, dry cough; clear, white, yellow, green)     -Green phlegm   4. HEMOPTYSIS: "Are you coughing up any blood?" If so ask: "How much?" (flecks, streaks, tablespoons, etc.)    ---- R. Nostril: Blood flecks-dark blood   5. DIFFICULTY BREATHING: "Are you having difficulty breathing?" If Yes,  ask: "How bad is it?" (e.g., mild, moderate, severe)    - MILD: No SOB at rest, mild SOB with walking, speaks normally in sentences, can lie down, no retractions, pulse < 100.    - MODERATE: SOB at rest, SOB with minimal exertion and prefers to sit, cannot lie down flat, speaks in phrases, mild retractions, audible wheezing, pulse 100-120.    - SEVERE: Very SOB at rest, speaks in single words, struggling to breathe, sitting hunched forward, retractions, pulse > 120      ---Denies   6. FEVER: "Do you have a fever?" If Yes, ask: "What is your temperature, how was it measured, and when did it start?"    --- "gets very hot" unsure if that is d/t fever or post-menopausal symptoms.    7. CARDIAC HISTORY: "Do you have any history of heart disease?" (e.g., heart attack, congestive heart failure)     --- 8. LUNG HISTORY: "Do you have any history of lung disease?"  (e.g., pulmonary embolus, asthma, emphysema)     ----- Denies SOB/difficulty breating    9. PE RISK FACTORS: "Do you have a history of blood clots?" (or: recent major surgery, recent prolonged travel, bedridden)    ----   10. OTHER SYMPTOMS: "Do you have any other symptoms?" (e.g., runny nose, wheezing, chest pain) ---- Fatigue- unusual for her  ----- Intermittent episodes: diaphoretic  ----- Mucinex    ---COVID/FLU- Negative  Protocols used: Cough - Acute Productive-A-AH

## 2023-06-29 ENCOUNTER — Ambulatory Visit (INDEPENDENT_AMBULATORY_CARE_PROVIDER_SITE_OTHER): Admitting: Family Medicine

## 2023-06-29 ENCOUNTER — Encounter: Payer: Self-pay | Admitting: Family Medicine

## 2023-06-29 VITALS — BP 132/70 | HR 79 | Temp 98.9°F | Wt 159.8 lb

## 2023-06-29 DIAGNOSIS — J040 Acute laryngitis: Secondary | ICD-10-CM | POA: Diagnosis not present

## 2023-06-29 DIAGNOSIS — J40 Bronchitis, not specified as acute or chronic: Secondary | ICD-10-CM | POA: Diagnosis not present

## 2023-06-29 MED ORDER — AMOXICILLIN-POT CLAVULANATE 875-125 MG PO TABS
1.0000 | ORAL_TABLET | Freq: Two times a day (BID) | ORAL | 0 refills | Status: DC
Start: 2023-06-29 — End: 2023-11-11

## 2023-06-29 MED ORDER — FLUCONAZOLE 150 MG PO TABS: 150.0000 mg | ORAL_TABLET | Freq: Once | ORAL | 0 refills | Status: AC

## 2023-06-29 NOTE — Telephone Encounter (Signed)
 Appt scheduled

## 2023-06-29 NOTE — Progress Notes (Signed)
   Subjective:    Patient ID: Claudia Elliott, female    DOB: 09-06-1965, 58 y.o.   MRN: [id number]  HPI She complains of a 5-day history of hoarse voice, sinus and chest congestion, productive cough, rhinorrhea, postnasal drainage and fatigue.  She did eat a taco prior to this but at that time she had no difficulty with swallowing.   Review of Systems     Objective:    Physical Exam Alert and in no distress.  No tenderness over sinuses.  Tympanic membranes and canals are normal. Pharyngeal area is normal. Neck is supple without adenopathy or thyromegaly. Cardiac exam shows a regular sinus rhythm without murmurs or gallops. Lungs are clear to auscultation.        Assessment & Plan:  Laryngitis  Bronchitis - Plan: amoxicillin -clavulanate (AUGMENTIN ) 875-125 MG tablet  Explained that the laryngitis usually has to run its course but she has other symptoms that make it sound as if she has a bacterial infection and will therefore give Augmentin .  Will also give Diflucan  in case she does get a vaginitis from this.

## 2023-07-06 ENCOUNTER — Ambulatory Visit: Payer: Self-pay

## 2023-07-06 ENCOUNTER — Ambulatory Visit (INDEPENDENT_AMBULATORY_CARE_PROVIDER_SITE_OTHER): Admitting: Family Medicine

## 2023-07-06 ENCOUNTER — Encounter: Payer: Self-pay | Admitting: *Deleted

## 2023-07-06 ENCOUNTER — Encounter: Payer: Self-pay | Admitting: Family Medicine

## 2023-07-06 VITALS — BP 128/70 | HR 90 | Wt 159.4 lb

## 2023-07-06 DIAGNOSIS — L299 Pruritus, unspecified: Secondary | ICD-10-CM

## 2023-07-06 DIAGNOSIS — R5383 Other fatigue: Secondary | ICD-10-CM

## 2023-07-06 NOTE — Telephone Encounter (Signed)
 RN advised pt she should be seen within 24 hours. Then, this RN's technology malfunctioned and the call was dropped. RN called the pt back and LVM with a callback number. Will send to callback folder for more attempts.  Copied from CRM [id number]. Topic: Clinical - Red Word Triage >> Jul 06, 2023  8:48 AM Claudia Elliott wrote: Red Word that prompted transfer to Nurse Triage: still coughing, no voice and very fatigue. This has been going on for 10 days Reason for Disposition  [1] MODERATE weakness (i.e., interferes with work, school, normal activities) AND [2] persists > 3 days  Answer Assessment - Initial Assessment Questions 1. DESCRIPTION: "Describe how you are feeling."     "Very tired" 2. SEVERITY: "How bad is it?"  "Can you stand and walk?"   - MILD (0-3): Feels weak or tired, but does not interfere with work, school or normal activities.   - MODERATE (4-7): Able to stand and walk; weakness interferes with work, school, or normal activities.   - SEVERE (8-10): Unable to stand or walk; unable to do usual activities.     Pt states she is able to stand and walk, is going to work late today 3. ONSET: "When did these symptoms begin?" (e.g., hours, days, weeks, months)     10 days 4. CAUSE: "What do you think is causing the weakness or fatigue?" (e.g., not drinking enough fluids, medical problem, trouble sleeping)     Pt states she is eating and drinking enough, endorses she is drinking water frequently; denies difficulty sleeping 5. NEW MEDICINES:  "Have you started on any new medicines recently?" (e.g., opioid pain medicines, benzodiazepines, muscle relaxants, antidepressants, antihistamines, neuroleptics, beta blockers)     Augmentin  from 5/21, denies anything else 6. OTHER SYMPTOMS: "Do you have any other symptoms?" (e.g., chest pain, fever, cough, SOB, vomiting, diarrhea, bleeding, other areas of pain)     Fatigue that has gotten worse since 5/21 office visit. Coughing up clear phlegm, "I get very  hot and sweat", temperature of 53F. Pt endorses itching, pt states the place she used to live had black mold. Itching "all over, it just depends." Endorses itching to her stomach. Pt states sometimes little bumps come up on her body, states they are brought on by stress  Denies difficulty breathing, denies chest pain, denies dizziness, denies rectal bleeding. States she was bleeding from her nose with blowing it, states that has resolved. Denies diarrhea and vomiting  Protocols used: Weakness (Generalized) and Fatigue-A-AH

## 2023-07-06 NOTE — Progress Notes (Signed)
   Subjective:    Patient ID: Claudia Elliott, female    DOB: 08/15/1965, 58 y.o.   MRN: [id number]  HPI She originally said she was exposed to mold however that was over a month ago.  She then states that she has had difficulty with fatigue as well as itching and depression for a much longer period of time.  She also has itching that does seem to be related to stress.  She is concerned and wants to make sure that her blood studies are okay.  Review of Systems     Objective:    Physical Exam Alert and in no distress. Tympanic membranes and canals are normal. Pharyngeal area is normal. Neck is supple without adenopathy or thyromegaly. Cardiac exam shows a regular sinus rhythm without murmurs or gallops. Lungs are clear to auscultation.        Assessment & Plan:  Other fatigue - Plan: CBC with Differential/Platelet, Comprehensive metabolic panel with GFR  Itching - Plan: CBC with Differential/Platelet, Comprehensive metabolic panel with GFR  I explained that if the blood work comes back normal, set up an appointment with [facility] explore this further and also look into depression playing a major role with this.

## 2023-07-06 NOTE — Telephone Encounter (Signed)
 Patient returned call to schedule appt within 24 hours. None available until Friday and none with PCP. Called [facility] for assistance with scheduling and patient able to be scheduled this afternoon.

## 2023-07-06 NOTE — Telephone Encounter (Signed)
 This encounter was created in error - please disregard.

## 2023-07-06 NOTE — Telephone Encounter (Signed)
 Attempted to contact patient to schedule appt within 24 hours as directed from previous encounter from triage call. No answer, LVMTCB [phone].

## 2023-07-07 ENCOUNTER — Ambulatory Visit: Payer: Self-pay | Admitting: Family Medicine

## 2023-07-07 ENCOUNTER — Ambulatory Visit: Admitting: Family Medicine

## 2023-07-07 LAB — COMPREHENSIVE METABOLIC PANEL WITH GFR
ALT: 15 IU/L (ref 0–32)
AST: 23 IU/L (ref 0–40)
Albumin: 4.6 g/dL (ref 3.8–4.9)
Alkaline Phosphatase: 67 IU/L (ref 44–121)
BUN/Creatinine Ratio: 9 — ABNORMAL LOW (ref 12–28)
BUN: 12 mg/dL (ref 8–27)
Bilirubin Total: 0.4 mg/dL (ref 0.0–1.2)
CO2: 23 mmol/L (ref 20–29)
Calcium: 10.1 mg/dL (ref 8.7–10.3)
Chloride: 101 mmol/L (ref 96–106)
Creatinine, Ser: 1.27 mg/dL — ABNORMAL HIGH (ref 0.57–1.00)
Globulin, Total: 2.2 g/dL (ref 1.5–4.5)
Glucose: 95 mg/dL (ref 70–99)
Potassium: 3.8 mmol/L (ref 3.5–5.2)
Sodium: 139 mmol/L (ref 134–144)
Total Protein: 6.8 g/dL (ref 6.0–8.5)
eGFR: 48 mL/min/{1.73_m2} — ABNORMAL LOW (ref >59)

## 2023-07-07 LAB — CBC WITH DIFFERENTIAL/PLATELET
Basophils Absolute: 0.1 10*3/uL (ref 0.0–0.2)
Basos: 2 %
EOS (ABSOLUTE): 0.1 10*3/uL (ref 0.0–0.4)
Eos: 3 %
Hematocrit: 51.6 % — ABNORMAL HIGH (ref 34.0–46.6)
Hemoglobin: 17.2 g/dL — ABNORMAL HIGH (ref 11.1–15.9)
Immature Grans (Abs): 0 10*3/uL (ref 0.0–0.1)
Immature Granulocytes: 0 %
Lymphocytes Absolute: 1.6 10*3/uL (ref 0.7–3.1)
Lymphs: 41 %
MCH: 32.1 pg (ref 26.6–33.0)
MCHC: 33.3 g/dL (ref 31.5–35.7)
MCV: 96 fL (ref 79–97)
Monocytes Absolute: 0.4 10*3/uL (ref 0.1–0.9)
Monocytes: 10 %
Neutrophils Absolute: 1.7 10*3/uL (ref 1.4–7.0)
Neutrophils: 44 %
Platelets: 400 10*3/uL (ref 150–450)
RBC: 5.36 x10E6/uL — ABNORMAL HIGH (ref 3.77–5.28)
RDW: 12.5 % (ref 11.7–15.4)
WBC: 3.9 10*3/uL (ref 3.4–10.8)

## 2023-08-31 ENCOUNTER — Emergency Department (HOSPITAL_BASED_OUTPATIENT_CLINIC_OR_DEPARTMENT_OTHER)
Admission: EM | Admit: 2023-08-31 | Discharge: 2023-08-31 | Disposition: A | Discharge status: Home / Self Care | Attending: Emergency Medicine | Admitting: Emergency Medicine

## 2023-08-31 ENCOUNTER — Encounter (HOSPITAL_BASED_OUTPATIENT_CLINIC_OR_DEPARTMENT_OTHER): Payer: Self-pay

## 2023-08-31 ENCOUNTER — Emergency Department (HOSPITAL_BASED_OUTPATIENT_CLINIC_OR_DEPARTMENT_OTHER): Admitting: Radiology

## 2023-08-31 ENCOUNTER — Emergency Department (HOSPITAL_BASED_OUTPATIENT_CLINIC_OR_DEPARTMENT_OTHER)

## 2023-08-31 ENCOUNTER — Other Ambulatory Visit: Payer: Self-pay

## 2023-08-31 ENCOUNTER — Other Ambulatory Visit (HOSPITAL_BASED_OUTPATIENT_CLINIC_OR_DEPARTMENT_OTHER): Payer: Self-pay

## 2023-08-31 DIAGNOSIS — J3489 Other specified disorders of nose and nasal sinuses: Secondary | ICD-10-CM | POA: Diagnosis not present

## 2023-08-31 DIAGNOSIS — S060X0A Concussion without loss of consciousness, initial encounter: Secondary | ICD-10-CM | POA: Diagnosis present

## 2023-08-31 DIAGNOSIS — Y92410 Unspecified street and highway as the place of occurrence of the external cause: Secondary | ICD-10-CM | POA: Insufficient documentation

## 2023-08-31 DIAGNOSIS — S8001XA Contusion of right knee, initial encounter: Secondary | ICD-10-CM | POA: Insufficient documentation

## 2023-08-31 DIAGNOSIS — M542 Cervicalgia: Secondary | ICD-10-CM | POA: Insufficient documentation

## 2023-08-31 MED ORDER — ACETAMINOPHEN 500 MG PO TABS
1000.0000 mg | ORAL_TABLET | Freq: Once | ORAL | Status: AC
  Administered 2023-08-31: 1000 mg via ORAL
  Filled 2023-08-31: qty 2

## 2023-08-31 MED ORDER — CYCLOBENZAPRINE HCL 10 MG PO TABS
5.0000 mg | ORAL_TABLET | Freq: Every evening | ORAL | 0 refills | Status: AC | PRN
  Filled 2023-08-31: qty 10, 10d supply, fill #0
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
## 2023-10-04 NOTE — Progress Notes (Signed)
 Breast and Pelvic Exam Patient name: Claudia Elliott MRN [id number]  Date of birth: [birth year] Chief Complaint:   Breast and Pelvic Exam  History of Present Illness:   Claudia Elliott is a 58 y.o. [phone] African-American female being seen today for breast and pelvic exam.  Reports she was having a lot of hot flashes but that seems to have resolved.      She is interested in considering breast augmentation.  Has asymmetric breast with L larger than R side.  Referral placed.    Patient's last menstrual period was 06/13/2014 (approximate).   Last pap 02/15/2022. Results were: NILM w/ HRHPV not done. H/O abnormal pap: yes h/o +HR HPV 2016 [facility] Results were normal Birads 1:negative. Family h/o breast cancer: no Last colonoscopy: 07/2020. Results were abnormal with ischemic colitis. Family h/o colorectal cancer: no     01/25/2023    3:02 PM 11/11/2022    2:51 PM 02/15/2022    2:46 PM 11/09/2021    3:05 PM 06/03/2021   11:21 AM  Depression screen PHQ 2/9  Decreased Interest 0 0 0 1 0  Down, Depressed, Hopeless 3 2 1 1  0  PHQ - 2 Score 3 2 1 2  0  Altered sleeping 3 3  3    Tired, decreased energy 3 3  2    Change in appetite 0 1  2   Feeling bad or failure about yourself  0 1  2   Trouble concentrating 0 1  1   Moving slowly or fidgety/restless 0 0  1   Suicidal thoughts 0 0  0   PHQ-9 Score 9 11  13    Difficult doing work/chores Very difficult Very difficult  Somewhat difficult     Review of Systems:   Pertinent items are noted in HPI Denies any headaches, blurred vision, fatigue, shortness of breath, chest pain, abdominal pain, abnormal vaginal discharge/itching/odor/irritation, problems with periods, bowel movements, urination, or intercourse unless otherwise stated above. Pertinent History Reviewed:  Reviewed past medical,surgical, social and family history.  Reviewed problem list, medications and allergies. Physical Assessment:    Vitals:   10/05/23 0848  BP: 124/87  Pulse: 83  SpO2: 99%  Weight: 153 lb 6.4 oz (69.6 kg)  Height: 5' 5.25 (1.657 m)  Body mass index is 25.33 kg/m.        Physical Examination:   General appearance - well appearing, and in no distress  Mental status - alert, oriented to person, place, and time  Psych:  She has a normal mood and affect  Skin - warm and dry, normal color, no suspicious lesions noted  Chest - effort normal, all lung fields clear to auscultation bilaterally  Heart - normal rate and regular rhythm  Neck:  midline trachea, no thyromegaly or nodules  Breasts - breasts appear normal, no suspicious masses, no skin or nipple changes or  axillary nodes  Abdomen - soft, nontender, nondistended, no masses or organomegaly  Pelvic - VULVA: normal appearing vulva with no masses, tenderness or lesions   VAGINA: normal appearing vagina with normal color and discharge, no lesions   CERVIX: normal appearing cervix without discharge or lesions, no CMT  Thin prep pap is not indicated  UTERUS: uterus is felt to be normal size, shape, consistency and nontender   ADNEXA: No adnexal masses or tenderness noted.  Rectal - normal rectal, good sphincter tone, no masses felt.  Extremities:  No swelling or varicosities noted  Chaperone  present for exam  No results found for this or any previous visit (from the past 24 hours).  Assessment & Plan:  1. GYN exam for high-risk Medicare patient (Primary) - Pap smear not indicated today.  Was neg with neg HR HPV 02/16/2023 - Mammogram 06/14/2022 - Colonoscopy 07/2020.  Done due to ischemic colitis. - Bone mineral density guidelines reviewed - lab work done with PCP - vaccines reviewed/updated  2. Breast asymmetry - Ambulatory referral to Plastic Surgery  3. Encounter for screening mammogram for malignant neoplasm of breast - MM 3D SCREENING MAMMOGRAM BILATERAL BREAST; Future  4. Vaginal atrophy - Rx for Vit E vaginal cream to pahramcy -  NONFORMULARY OR COMPOUNDED ITEM; Vitamin E vaginal cream 200u/ml.  One ml two to three times weekly.  Disp: 3 month supply.  Dispense: 36 each; Refill: 3  5. 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   Follow-up: No follow-ups on file.  Ronal GORMAN P[place], MD 10/05/2023 9:16 AM

## 2023-10-05 ENCOUNTER — Ambulatory Visit (HOSPITAL_BASED_OUTPATIENT_CLINIC_OR_DEPARTMENT_OTHER): Payer: Medicare (Managed Care) | Admitting: Obstetrics & Gynecology

## 2023-10-05 ENCOUNTER — Encounter (HOSPITAL_BASED_OUTPATIENT_CLINIC_OR_DEPARTMENT_OTHER): Payer: Self-pay | Admitting: Obstetrics & Gynecology

## 2023-10-05 VITALS — BP 124/87 | HR 83 | Ht 65.25 in | Wt 153.4 lb

## 2023-10-05 DIAGNOSIS — Z1231 Encounter for screening mammogram for malignant neoplasm of breast: Secondary | ICD-10-CM | POA: Diagnosis not present

## 2023-10-05 DIAGNOSIS — N6489 Other specified disorders of breast: Secondary | ICD-10-CM

## 2023-10-05 DIAGNOSIS — R87810 Cervical high risk human papillomavirus (HPV) DNA test positive: Secondary | ICD-10-CM

## 2023-10-05 DIAGNOSIS — N952 Postmenopausal atrophic vaginitis: Secondary | ICD-10-CM | POA: Diagnosis not present

## 2023-10-05 DIAGNOSIS — Z9189 Other specified personal risk factors, not elsewhere classified: Secondary | ICD-10-CM | POA: Diagnosis not present

## 2023-10-05 MED ORDER — NONFORMULARY OR COMPOUNDED ITEM: 3 refills | Status: AC

## 2023-10-05 NOTE — Patient Instructions (Signed)
Call [phone] to schedule an appointment at [facility].    Call [phone] to scheduled at the [facility], [address] [place]  [place], [place] [address]  Mammograms can also be self-scheduled online through [place].

## 2023-10-26 ENCOUNTER — Telehealth (HOSPITAL_BASED_OUTPATIENT_CLINIC_OR_DEPARTMENT_OTHER): Payer: Self-pay | Admitting: Obstetrics & Gynecology

## 2023-10-26 ENCOUNTER — Ambulatory Visit: Admitting: Plastic Surgery

## 2023-10-26 NOTE — Telephone Encounter (Signed)
 LMOM at 4:26 for patient to call office. Patient will need to call her insurance company and find out who is in network with her insurance. tbw

## 2023-10-26 NOTE — Telephone Encounter (Signed)
 New message    The patient had an appointment with Dr. Waddell at Plastics today  Dr. Waddell office is out of network for the patient.   The patient is requesting a referral to an in-network provider .   The office cancel her appointment due to patient paying out of pocket.

## 2023-11-11 ENCOUNTER — Telehealth: Payer: Self-pay

## 2023-11-11 ENCOUNTER — Encounter: Payer: Self-pay | Admitting: Nurse Practitioner

## 2023-11-11 ENCOUNTER — Telehealth: Admitting: Nurse Practitioner

## 2023-11-11 VITALS — Wt 151.0 lb

## 2023-11-11 DIAGNOSIS — J101 Influenza due to other identified influenza virus with other respiratory manifestations: Secondary | ICD-10-CM | POA: Diagnosis not present

## 2023-11-11 MED ORDER — OSELTAMIVIR PHOSPHATE 75 MG PO CAPS: 75.0000 mg | ORAL_CAPSULE | Freq: Two times a day (BID) | ORAL | 0 refills | Status: AC

## 2023-11-11 MED ORDER — HYDROCODONE BIT-HOMATROP MBR 5-1.5 MG/5ML PO SOLN: 5.0000 mL | Freq: Two times a day (BID) | ORAL | 0 refills | Status: AC | PRN

## 2023-11-11 NOTE — Telephone Encounter (Signed)
 Pt. Needs virtual appt.    Copied from CRM [id number]. Topic: Clinical - Medical Advice >> Nov 11, 2023  8:40 AM Thersia BROCKS wrote: Reason for CRM: Patient called in stated she was tested positive for flu yesterday and was told to call in this morning for prescription of tamiflu  [id number] [place], [place] - [address] [place] [address] Phone: [phone] Fax: [phone]

## 2023-11-11 NOTE — Progress Notes (Signed)
 Virtual Visit Encounter mychart visit. Had to convert to phone visit due to inability for microphone to connect on the patients device.   I connected with  Reena VEAR Presser on 11/11/23 at 10:30 AM EDT by secure video and audio telemedicine application. I verified that I am speaking with the correct person using two identifiers.   I introduced myself as a Publishing rights manager with the practice. The limitations of evaluation and management by telemedicine discussed with the patient and the availability of in person appointments. The patient expressed verbal understanding and consent to proceed.  Participating parties in this visit include: Myself and patient  The patient is: Patient Location: Home I am: Provider Location: Office/Clinic Subjective:    CC and HPI:   History of Present Illness KALIYAN OSBOURN is a 58 year old female who presents with flu symptoms after testing positive for influenza B.  She tested positive for influenza B last night after experiencing symptoms that began to worsen yesterday. Her symptoms include a severe sore throat, cough, headache, nasal congestion, runny nose, fatigue, and alternating sensations of feeling hot and cold. She has not measured her temperature but suspects she may have a fever due to feeling very hot and needing to drink ice-cold water frequently. She refilled her thermal flask with ice and water Taje Littler this morning to stay hydrated.  She reports ear pain, which she has not experienced before. She has not experienced high fevers but feels hot and has been managing her symptoms with hydration. She has a history of increased creatinine levels.  She has not taken any medications for her symptoms yet. Her brother can help her obtain over-the-counter options like Tylenol  and prescription medications. She works in an environment with frequent contact with people and paper, which she believes may have contributed to her exposure. She has informed her workplace  about her diagnosis to prevent further spread.  Past medical history, Surgical history, Family history not pertinant except as noted below, Social history, Allergies, and medications have been entered into the medical record, reviewed, and corrections made.   Review of Systems:  All review of systems negative except what is listed in the HPI  Objective:    Alert and oriented x 4 Speaking in clear sentences with no shortness of breath. Productive cough and congestion noted.  No distress.  Impression and Recommendations:    Problem List Items Addressed This Visit   None Visit Diagnoses       Influenza B    -  Primary   Relevant Medications   oseltamivir (TAMIFLU) 75 MG capsule   HYDROcodone  bit-homatropine (HYCODAN) 5-1.5 MG/5ML syrup     Influenza with upper respiratory symptoms including acute pharyngitis, sinus congestion, and ear pain Acute influenza B infection confirmed by positive test. Symptoms include severe sore throat, cough, headache, nasal congestion, runny nose, fatigue, and ear pain. No high fever reported, but experiences hot and cold sensations suggesting underlying fever present. Symptoms began within the last 24 hours, with significant throat pain and lack of appetite. Ear pain likely due to sinus congestion causing increased pressure on the eardrum. Advised of over the counter treatment options that may be helpful. Pharmaceutical options include Tamiflu and Hycodan. She has a history of intolerance to hydromorphone and oxycodone  with itching listed, but no report of maculopapular rash, hypotension, bronchospasm, or angioedema which would indicate a IgE mediated response. Hydrocodone  formulation in Hycodan very low dose. If she is unable to tolerate this, we can trial promethazine  cough syrup  without opiate.  - Prescribe Tamiflu, twice daily for 5 days. - Prescribe cough syrup with analgesic properties to aid with sore throat, congestion, sinus pressure, and cough. -  Recommend acetaminophen  for fever and body aches, with caution regarding ibuprofen  due to kidney function. - Advise steam inhalation, humidifier, or hot showers to alleviate congestion. - Suggest warm salt water gargles and warm fluids like broth or tea for sore throat relief. - Recommend Vicks vapor rub for symptomatic relief. - Advise rest and hydration. - Instruct to avoid public places for at least 5 days from symptom onset.  orders and follow up as documented in EMR I discussed the assessment and treatment plan with the patient. The patient was provided an opportunity to ask questions and all were answered. The patient agreed with the plan and demonstrated an understanding of the instructions.   The patient was advised to call back or seek an in-person evaluation if the symptoms worsen or if the condition fails to improve as anticipated.  Follow-Up: prn  I provided 22 minutes of non-face-to-face interaction with this non face-to-face encounter including intake, same-day documentation, and chart review.   Camie CHARLENA Doing, NP , DNP, AGNP-c [facility]

## 2023-11-11 NOTE — Patient Instructions (Addendum)
 For the Flu I recommend the following treatment options:  *  Rest- sleep when you feel tired. Avoid doing anything strenuous *  Drink plenty of fluids. Hydration is very important when you are sick to help your body heal.  *  Take over-the-counter medications such as: *  Acetaminophen  (Tylenol ) or ibuprofen  (Advil , Motrin ) for fever and pain/sore throat/ear pressure. I recommend 1000mg  of Tylenol . If this is not effective you can use 400mg  Ibuprofen  up to 2 times a day for no more than 3 days.  * Use a humidifier or take hot showers to relieve congestion  * Warm salt water gargles * Warm broth and tea * Vicks vapo rub or similar for congestion  I have sent in the following medications to help: *  Antiviral medications- Tamiflu  * Cough/Congestion/Pain medication- Hycodan  Hycodan has hydrocodone , but not hydromorphone or oxycodone  which has caused itching in the past. Most people that have itching with hydromorphone and oxycodone  can tolerate hydrocodone ).  If you had any other reaction to the pain medications listed and do not feel that you can tolerate this, please let us  know and we can try a promethazine  cough syrup without any pain medication. You can try 1/2 dose of this to start out with to see if the 1/2 dose works well for you. It is a VERY small amount of hydrocodone .    I recommend you stay home and avoid others until your symptoms are improving and you have been fever free for at least 24 hours.  The flu is VERY contagious and can pass to others in the household quickly. Avoid staying in the common areas where you may make others sick.  You may feel weak and tired for a few weeks after you recover. You may also continue to have a cough. This is normal. If your symptoms start to improve then suddenly start to get worse again, please contact the office for further evaluation.

## 2023-11-15 ENCOUNTER — Ambulatory Visit: Payer: Self-pay

## 2023-11-15 NOTE — Telephone Encounter (Signed)
 FYI Only or Action Required?: Action required by provider: request for documentation or forms and medication request.  Patient was last seen in primary care on 11/11/2023 by Early, Camie BRAVO, NP.  Called Nurse Triage reporting Influenza.  Symptoms began several days ago.  Interventions attempted: OTC medications: cold, Prescription medications: hycodan,tamiflu, and Rest, hydration, or home remedies.  Symptoms are: gradually improving.  Triage Disposition: See Physician Within 24 Hours  Patient/caregiver understands and will follow disposition?: No, wishes to speak with PCP    Copied from CRM #[id number]. Topic: Clinical - Red Word Triage >> Nov 15, 2023 11:30 AM Claudia Elliott wrote: Red Word that prompted transfer to Nurse Triage: The patient was diagnosed with influenza on 10/3 and is currently taking Tamiflu. She is reporting ongoing fatigue, bilateral ear pain, and a burning sensation in the throat. Reason for Disposition  Earache  Answer Assessment - Initial Assessment Questions Additional info; 1)  11/11/23 diagnosed with the flu, rx tamiflu. She is calling today because her symptoms have only slightly improved, cough has improved, continues with bilateral ear pain and throat pain is improved but  is burning. Declined acute office visit due to cost. Could she have an antibiotic or other medications prescribed? 2) patient is having internet connectivity issues and is unable to access her note for work written on 11/11/23, she is requesting office to text her the document. This Clinical research associate let patient know that she should be able to access letter on her Mychart, she states she doesn't remember her Mychart password, this Clinical research associate offered to reset her MyChart password but she declined due to internet issues. Again requesting copy of letter texted to her [phone]   1. DIAGNOSIS CONFIRMATION: When was the influenza diagnosed? By whom? Did you get a test for it?     Office visit 2. INFLUENZA  MEDICINES: Were you prescribed any medicines for the influenza?  (e.g., zanamivir [Relenza], oseltamivir [Tamiflu]).      Tamiflu 3. SYMPTOMS: What is your main symptom or concern? (e.g., cough, fever, shortness of breath, muscle aches)     Fatigue, bilateral ear pain, throat burning  4. ONSET: When did the symptoms start?      Several days  5. COUGH: Do you have a cough? If Yes, ask: How bad is the cough?       Improved  6. FEVER: Do you have a fever? If Yes, ask: What is your temperature, how was it measured, and when did it start?     99.7 7. BREATHING DIFFICULTY: Are you having any difficulty breathing? (e.g., normal; shortness of breath, wheezing, unable to speak)      denies 8. PREGNANCY: Is there any chance you are pregnant? When was your last menstrual period?      9. HIGH RISK FOR COMPLICATIONS: Do you have any chronic medical problems? (e.g., asthma, heart or lung disease, obesity, weak immune system)      10. PREGNANCY: Is there any chance you are pregnant? When was your last menstrual period?        11. O2 SATURATION MONITOR:  Do you use an oxygen saturation monitor (pulse oximeter) at home? If Yes, ask What is your reading (oxygen level) today? What is your usual oxygen saturation reading? (e.g., 95%)  Protocols used: Influenza (Flu) Follow-up Call-A-[facility]

## 2023-11-15 NOTE — Telephone Encounter (Signed)
 Please call Claudia Elliott, she does not have access to MyChart.   The influenza virus commonly causes significant symptoms for 7-10 days. She may still feel ill through the weekend. We are very [place] in the season, but from what I have seen thus far, this years virus causes severe sore throat that typically last 7-14 days. The cough and ear fullness from the flu can last for up to 3 months.   The medications provided are for supportive care, but not curative. Tamiflu only helps to make the illness not as severe as it would be without treatment. There really are not other options for medications than what has been recommended, more time will be needed.   Antibiotics do not work for influenza. This is a viral illness and antibiotics only work on bacterial illnesses.   FROM AVS 11/11/2023 For the Flu I recommend the following treatment options:   *  Rest- sleep when you feel tired. Avoid doing anything strenuous *  Drink plenty of fluids. Hydration is very important when you are sick to help your body heal.  *  Take over-the-counter medications such as: *  Acetaminophen  (Tylenol ) or ibuprofen  (Advil , Motrin ) for fever and pain/sore throat/ear pressure. I recommend 1000mg  of Tylenol . If this is not effective you can use 400mg  Ibuprofen  up to 2 times a day for no more than 3 days.  * Use a humidifier or take hot showers to relieve congestion  * Warm salt water gargles * Warm broth and tea * Vicks vapo rub or similar for congestion  I recommend you stay home and avoid others until your symptoms are improving and you have been fever free for at least 24 hours. This may take 7-10 days, and in some patients it can be longer.  The flu is VERY contagious and can pass to others in the household quickly. Avoid staying in the common areas where you may make others sick.    You may feel weak and tired for a few weeks after you recover. You may also continue to have a cough for a few months. This is normal. If your  symptoms start to improve then suddenly start to get worse again, please contact the office for further evaluation.

## 2024-01-02 ENCOUNTER — Other Ambulatory Visit: Payer: Self-pay | Admitting: Nurse Practitioner

## 2024-01-02 DIAGNOSIS — I10 Essential (primary) hypertension: Secondary | ICD-10-CM

## 2024-01-02 MED ORDER — VERAPAMIL HCL ER 240 MG PO TBCR: 240.0000 mg | EXTENDED_RELEASE_TABLET | Freq: Every day | ORAL | 1 refills | Status: AC

## 2024-01-02 NOTE — Telephone Encounter (Signed)
 Copied from CRM [id number]. Topic: Clinical - Medication Refill >> Jan 02, 2024  3:17 PM Berwyn MATSU wrote: Medication:  verapamil  (CALAN -SR) 240 MG CR tablet  Has the patient contacted their pharmacy? Yes (Agent: If no, request that the patient contact the pharmacy for the refill. If patient does not wish to contact the pharmacy document the reason why and proceed with request.) (Agent: If yes, when and what did the pharmacy advise?)  This is the patient's preferred pharmacy:  [id number] [place], [place] - [address] [place] [address] Phone: [phone] Fax: [phone]    Is this the correct pharmacy for this prescription? Yes If no, delete pharmacy and type the correct one.   Has the prescription been filled recently? Yes  Is the patient out of the medication? Yes  Has the patient been seen for an appointment in the last year OR does the patient have an upcoming appointment? Yes  Can we respond through MyChart? Yes  Agent: Please be advised that Rx refills may take up to 3 business days. We ask that you follow-up with your pharmacy.

## 2024-02-20 ENCOUNTER — Other Ambulatory Visit: Payer: Self-pay

## 2024-02-20 ENCOUNTER — Encounter (HOSPITAL_COMMUNITY): Payer: Self-pay | Admitting: *Deleted

## 2024-02-20 ENCOUNTER — Emergency Department (HOSPITAL_COMMUNITY)
Admission: EM | Admit: 2024-02-20 | Discharge: 2024-02-20 | Disposition: A | Discharge status: Home / Self Care | Attending: Emergency Medicine | Admitting: Emergency Medicine

## 2024-02-20 ENCOUNTER — Emergency Department (HOSPITAL_COMMUNITY)

## 2024-02-20 DIAGNOSIS — R0789 Other chest pain: Secondary | ICD-10-CM | POA: Diagnosis present

## 2024-02-20 DIAGNOSIS — Z79899 Other long term (current) drug therapy: Secondary | ICD-10-CM | POA: Diagnosis not present

## 2024-02-20 DIAGNOSIS — I10 Essential (primary) hypertension: Secondary | ICD-10-CM | POA: Insufficient documentation

## 2024-02-20 LAB — CBC
HCT: 46.2 % — ABNORMAL HIGH (ref 36.0–46.0)
Hemoglobin: 15.1 g/dL — ABNORMAL HIGH (ref 12.0–15.0)
MCH: 30.9 pg (ref 26.0–34.0)
MCHC: 32.7 g/dL (ref 30.0–36.0)
MCV: 94.5 fL (ref 80.0–100.0)
Platelets: 428 K/uL — ABNORMAL HIGH (ref 150–400)
RBC: 4.89 MIL/uL (ref 3.87–5.11)
RDW: 14.6 % (ref 11.5–15.5)
WBC: 4.2 K/uL (ref 4.0–10.5)
nRBC: 0 % (ref 0.0–0.2)

## 2024-02-20 LAB — BASIC METABOLIC PANEL WITH GFR
Anion gap: 12 (ref 5–15)
BUN: 10 mg/dL (ref 6–20)
CO2: 25 mmol/L (ref 22–32)
Calcium: 9.3 mg/dL (ref 8.9–10.3)
Chloride: 104 mmol/L (ref 98–111)
Creatinine, Ser: 1.02 mg/dL — ABNORMAL HIGH (ref 0.44–1.00)
GFR, Estimated: >60 mL/min
Glucose, Bld: 68 mg/dL — ABNORMAL LOW (ref 70–99)
Potassium: 3.8 mmol/L (ref 3.5–5.1)
Sodium: 141 mmol/L (ref 135–145)

## 2024-02-20 LAB — TROPONIN T, HIGH SENSITIVITY: Troponin T High Sensitivity: <15 ng/L (ref 0–19)

## 2024-02-20 MED ORDER — NAPROXEN 375 MG PO TABS: 375.0000 mg | ORAL_TABLET | Freq: Two times a day (BID) | ORAL | 0 refills | Status: AC

## 2024-02-20 MED ORDER — NAPROXEN 250 MG PO TABS
375.0000 mg | ORAL_TABLET | Freq: Once | ORAL | Status: AC
  Administered 2024-02-20: 375 mg via ORAL
  Filled 2024-02-20: qty 2

## 2024-02-20 NOTE — Discharge Instructions (Addendum)
 You are seen today for chest wall pain.  I sent in some anti-inflammatories use for chest wall pain.  Continue to monitor symptoms and if new or worsening please return to the ER otherwise continue to follow-up with your PCP for any persistent symptoms.  Please take Naprosyn , 375mg  by mouth twice daily as needed for pain - this in an antiinflammatory medicine (NSAID) and is similar to ibuprofen  - many people feel that it is stronger than ibuprofen  and it is easier to take since it is a smaller pill.  Please use this only for 1 week - if your pain persists, you will need to follow up with your doctor in the office for ongoing guidance and pain control.

## 2024-02-20 NOTE — ED Triage Notes (Signed)
 Per EMS pt woke up with right side arm pain/numbness, chest pain in center and headache frontal at 0530. Went to work and symptoms got worse. Ongoing issue with waking up off and on. Right side is always painful and hurts just to arm and hand. Pain 4/10 to chest. Pt had one 1ntg sl and 324mg  asa. Pain in chest decreased to 3/10 and now just feels pressure. BP 120/80, HR 70s, 100% RA. EKG wnl per EMS.

## 2024-02-20 NOTE — ED Triage Notes (Signed)
 Pt did fall 2 weeks ago and states when she falls she always falls on her right side. She is legally blind in her left eye. Complains of right hand and arm pain with tingling. Right palpable radial pulse.

## 2024-02-20 NOTE — ED Provider Notes (Signed)
 " Claudia Elliott [facility] DEPARTMENT AT [facility] Provider Note   CSN: [id number] Arrival date & time: 02/20/24  1145     Patient presents with: Chest Pain   Claudia Elliott is a 59 y.o. female.   Chest Pain Patient is a 59 year old female presenting ED today for concerns for acute onset of chest pressure that she describes as sharp that she has been experiencing intermittently in the mornings for the last 2 weeks particularly when she wakes up but noticed that today she had accompanying right hand tingling with persistent chest pressure while at work, for which her manager called an ambulance for her to be brought to the hospital.  Symptoms are nonexertional, and have improved since her arrival to the ED.  Was provided nitroglycerin and ASA by EMS, noting that her chest pain had subsided with the nitroglycerin.  But now has a mild headache.   Previous medical history of Seizures, panic attacks, HTN, migraines  Denies fever, blurry vision, vertigo, dysphagia, unilateral weakness, decreased sensation, numbness, shortness of breath, cough, congestion, abdominal pain, nausea, vomiting, diarrhea, melena, hematochezia, dysuria, hematuria, vaginal bleeding, vaginal   Prior to Admission medications  Medication Sig Start Date End Date Taking? Authorizing Provider  naproxen  (NAPROSYN ) 375 MG tablet Take 1 tablet (375 mg total) by mouth 2 (two) times daily. 02/20/24  Yes Beola Terrall RAMAN, PA-C  Blood Pressure Monitoring (BLOOD PRESSURE KIT) KIT For monitoring BP. ICD-10 I10 03/05/21   Early, Sara E, NP  buPROPion (WELLBUTRIN XL) 300 MG 24 hr tablet Take 300 mg by mouth daily. 12/07/18   [provider]  busPIRone (BUSPAR) 30 MG tablet Take 60 mg by mouth daily.    [provider]  clonazePAM (KLONOPIN) 1 MG tablet Take 1 mg by mouth at bedtime as needed.    [provider]  cyclobenzaprine  (FLEXERIL ) 10 MG tablet Take 0.5-1 tablets (5-10 mg total) by mouth at  bedtime as needed for muscle spasms. Patient not taking: Reported on 11/11/2023 08/31/23   Veta Palma, PA-C  DULoxetine (CYMBALTA) 60 MG capsule Take 2 capsules by mouth daily. 10/15/17   [provider]  HYDROcodone  bit-homatropine (HYCODAN) 5-1.5 MG/5ML syrup Take 5 mLs by mouth every 12 (twelve) hours as needed for cough (congestion). 11/11/23   Early, Sara E, NP  hydroquinone 4 % cream  10/12/18   [provider]  levETIRAcetam  (KEPPRA ) 1000 MG tablet Take 1 tablet (1,000 mg total) by mouth daily. 03/05/21   Early, Sara E, NP  levETIRAcetam  (KEPPRA ) 750 MG tablet Take 1 tablet (750 mg total) by mouth daily. 03/05/21   Early, Sara E, NP  loperamide  (IMODIUM  A-D) 2 MG tablet Take 1 tablet (2 mg total) by mouth 4 (four) times daily as needed for diarrhea or loose stools. 03/29/22   Early, Sara E, NP  Multiple Vitamins-Minerals (MULTIVITAMIN GUMMIES ADULT) CHEW Take 2 gummies by mouth daily. 04/28/21   Early, Sara E, NP  NONFORMULARY OR COMPOUNDED ITEM Vitamin E vaginal cream 200u/ml.  One ml two to three times weekly.  Disp: 3 month supply. 10/05/23   Cleotilde Ronal RAMAN, MD  oseltamivir  (TAMIFLU ) 75 MG capsule Take 1 capsule (75 mg total) by mouth 2 (two) times daily. Take BID for 5 days.  Take with food. 11/11/23   Early, Sara E, NP  Sulfacetamide Sodium, Acne, 10 % LOTN Apply to face each am. Patient not taking: Reported on 11/11/2023 10/24/18   [provider]  traZODone  (DESYREL ) 50 MG tablet Take  1 tablet (50 mg total) by mouth at bedtime. 03/05/21   Early, Sara E, NP  verapamil  (CALAN -SR) 240 MG CR tablet Take 1 tablet (240 mg total) by mouth at bedtime. 01/02/24   Early, Sara E, NP  zonisamide (ZONEGRAN) 100 MG capsule Take by mouth. 10/25/21   [provider]    Allergies: Dilaudid [hydromorphone] and Percocet [oxycodone -acetaminophen ]    Review of Systems  Cardiovascular:  Positive for chest pain.  All other systems reviewed and are negative.   Updated Vital  Signs BP (!) 160/97 (BP Location: Right Arm)   Pulse 67   Temp 98.3 F (36.8 C) (Oral)   Resp 18   LMP 06/13/2014   SpO2 100%   Physical Exam Vitals and nursing note reviewed.  Constitutional:      General: She is not in acute distress.    Appearance: Normal appearance. She is not ill-appearing or diaphoretic.  HENT:     Head: Normocephalic and atraumatic.  Eyes:     General: No scleral icterus.       Right eye: No discharge.        Left eye: No discharge.     Extraocular Movements: Extraocular movements intact.     Conjunctiva/sclera: Conjunctivae normal.  Cardiovascular:     Rate and Rhythm: Normal rate and regular rhythm.     Pulses: Normal pulses.     Heart sounds: Normal heart sounds. No murmur heard.    No friction rub. No gallop.  Pulmonary:     Effort: Pulmonary effort is normal. No respiratory distress.     Breath sounds: No stridor. No wheezing, rhonchi or rales.     Comments: No bruising, rashes present on chest wall   Chest:     Chest wall: Tenderness (Notably has reproducible chest wall tenderness to palpation to precordial chest) present.  Abdominal:     General: Abdomen is flat. There is no distension.     Palpations: Abdomen is soft.     Tenderness: There is no abdominal tenderness. There is no right CVA tenderness, left CVA tenderness, guarding or rebound.  Musculoskeletal:        General: No swelling, deformity or signs of injury.     Cervical back: Normal range of motion. No rigidity.     Right lower leg: No edema.     Left lower leg: No edema.  Skin:    General: Skin is warm and dry.     Findings: No bruising, erythema or lesion.  Neurological:     General: No focal deficit present.     Mental Status: She is alert and oriented to person, place, and time. Mental status is at baseline.     Sensory: No sensory deficit.     Motor: No weakness.  Psychiatric:        Mood and Affect: Mood normal.     (all labs ordered are listed, but only abnormal  results are displayed) Labs Reviewed  CBC - Abnormal; Notable for the following components:      Result Value   Hemoglobin 15.1 (*)    HCT 46.2 (*)    Platelets 428 (*)    All other components within normal limits  BASIC METABOLIC PANEL WITH GFR - Abnormal; Notable for the following components:   Glucose, Bld 68 (*)    Creatinine, Ser 1.02 (*)    All other components within normal limits  TROPONIN T, HIGH SENSITIVITY  TROPONIN T, HIGH SENSITIVITY    EKG: None  Radiology: DG Chest 2 View Result Date: 02/20/2024 EXAM: 2 VIEW(S) XRAY OF THE CHEST 02/20/2024 12:32:00 PM COMPARISON: None available. CLINICAL HISTORY: Right side arm pain and numbness and chest pain. FINDINGS: LUNGS AND PLEURA: No focal pulmonary opacity. No pleural effusion. No pneumothorax. HEART AND MEDIASTINUM: No acute abnormality of the cardiac and mediastinal silhouettes. BONES AND SOFT TISSUES: No acute osseous abnormality. IMPRESSION: 1. No acute cardiopulmonary process. Electronically signed by: Waddell Calk MD MD 02/20/2024 02:41 PM EST RP Workstation: [id number]    Procedures   Medications Ordered in the ED  naproxen  (NAPROSYN ) tablet 375 mg (375 mg Oral Given 02/20/24 1600)                             Geneva (Revised) Score: 3, Geneva Score Interpretation: Low Risk Group: 7-9% incidence of pulmonary embolism from several studies   Medical Decision Making Amount and/or Complexity of Data Reviewed Labs: ordered. Radiology: ordered.  This patient is a 59 year old female who presents to the ED for concern of acute onset chest pressure she describes as sharp as an intermittent x 2 weeks worse in the mornings when she wakes up but had progressively gotten worse today while she was at work.  Not exertional.  On physical exam, patient is in no acute distress, afebrile, alert and orient x 4, speaking in full sentences, nontachypneic, nontachycardic.  Notable does have reproducible chest wall pain that is exact to  her previous pain she had before.  Chest pain had improved since being here.  With chest pain reproducible, low suspicion for PE at this time.  No shortness of breath patient started not experiencing any shortness of breath on exertion.  No pleuritic pain.  Lab work so far is reassuring, patient care transferred over to   Patient is worsening at home with this time.  Believe this is reasonable with patient symptoms resolved with naproxen .  Will have her follow-up with PCP and return to ER for new or worsening symptoms.  Patient vital signs have remained stable throughout the course of patient's time in the ED. Low suspicion for any other emergent pathology at this time. I believe this patient is safe to be discharged. Provided strict return to ER precautions. Patient expressed agreement and understanding of plan. All questions were answered.  Differential diagnoses prior to evaluation: The emergent differential diagnosis includes, but is not limited to, ACS, muscle skeletal pain, pneumonia, anxiety, PE. This is not an exhaustive differential.   Past Medical History / Co-morbidities / Social History: Seizures, panic attacks, HTN, hirsutism, migraines  Additional history: Chart reviewed. 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  Lab Tests/Imaging studies: I personally interpreted labs/imaging and the pertinent results include: CBC notes elevated hemoglobin of 15.1 likely secondary to dehydration.  With BMP noting mild elevated creatinine 1.02.  Troponin undetectable Chest x-ray unremarkable   I agree with the radiologist interpretation.  Cardiac monitoring: EKG obtained and interpreted by myself and attending physician which shows: NSR   Medications: I ordered medication including naproxen .  I have reviewed the patients home medicines and have made adjustments as  needed.  Critical Interventions: None  Social Determinants of Health: Lives at home alone  Disposition: After consideration of the diagnostic results and the patients response to treatment, I feel that the patient would benefit from discharge and treatment as above.  emergency department workup does not suggest an emergent condition requiring admission or immediate intervention beyond what has been performed at this time. The plan is: Follow-up with PCP, return to the ER for new or worsening symptoms, symptomatic management at home. The patient is safe for discharge and has been instructed to return immediately for worsening symptoms, change in symptoms or any other concerns.    Final diagnoses:  Chest wall pain    ED Discharge Orders          Ordered    naproxen  (NAPROSYN ) 375 MG tablet  2 times daily        02/20/24 1614               Beola Terrall RAMAN, [place] 02/20/24 1615  "

## 2024-02-22 ENCOUNTER — Ambulatory Visit: Admitting: Family Medicine

## 2024-02-22 ENCOUNTER — Ambulatory Visit
Admission: RE | Admit: 2024-02-22 | Discharge: 2024-02-22 | Disposition: A | Discharge status: Home / Self Care | Source: Ambulatory Visit | Attending: Family Medicine | Admitting: Family Medicine

## 2024-02-22 ENCOUNTER — Encounter: Payer: Self-pay | Admitting: Family Medicine

## 2024-02-22 ENCOUNTER — Ambulatory Visit: Payer: Self-pay

## 2024-02-22 VITALS — BP 130/80 | HR 67 | Wt 140.0 lb

## 2024-02-22 DIAGNOSIS — Z09 Encounter for follow-up examination after completed treatment for conditions other than malignant neoplasm: Secondary | ICD-10-CM

## 2024-02-22 DIAGNOSIS — M5412 Radiculopathy, cervical region: Secondary | ICD-10-CM

## 2024-02-22 MED ORDER — METHYLPREDNISOLONE 4 MG PO TBPK: ORAL_TABLET | ORAL | 0 refills | Status: AC

## 2024-02-22 NOTE — Patient Instructions (Signed)
 Please go get your xrays done at [facility]. You do not need to make an appointment. You can just show up.   Address: [address] [place], [place], [place] [address]

## 2024-02-22 NOTE — Telephone Encounter (Signed)
 FYI Only or Action Required?: FYI only for provider: ED advised.  Patient was last seen in primary care on 11/11/2023 by Early, Camie BRAVO, NP.  Called Nurse Triage reporting No chief complaint on file..  Symptoms began 2 days ago.  Interventions attempted: Nothing.  Symptoms are: unchanged.  Triage Disposition: No disposition on file.  Patient/caregiver understands and will follow disposition?:   Copied from CRM [id number]. Topic: Clinical - Red Word Triage >> Feb 22, 2024  8:43 AM Selinda RAMAN wrote: Red Word that prompted transfer to Nurse Triage: The patient called in stating she has a headache, chest pain and tingling in her right hand. She had a recent visit to the ER for the same thing and was prescribed naproxen  which only bothers her stomach. I will transfer her to E2C2 NT Reason for Disposition  [1] Chest pain lasts > 5 minutes AND [2] age > 14  Answer Assessment - Initial Assessment Questions HA frontal 7/10, numbness of rt arm, tingling of rt hand. Seen in ED for same symptoms 2 days ago.  Patient declines ED. Transferred to CAL   1. LOCATION: Where does it hurt?       mid 2. RADIATION: Does the pain go anywhere else? (e.g., into neck, jaw, arms, back)      3. ONSET: When did the chest pain begin? (Minutes, hours or days)      Few days ago 4. PATTERN: Does the pain come and go, or has it been constant since it started?  Does it get worse with exertion?      constant 5. DURATION: How long does it last (e.g., seconds, minutes, hours)      6. SEVERITY: How bad is the pain?  (e.g., Scale 1-10; mild, moderate, or severe)      7. CARDIAC RISK FACTORS: Do you have any history of heart problems or risk factors for heart disease? (e.g., angina, prior heart attack; diabetes, high blood pressure, high cholesterol, smoker, or strong family history of heart disease)      8. PULMONARY RISK FACTORS: Do you have any history of lung disease?  (e.g., blood clots in lung,  asthma, emphysema, birth control pills)      9. CAUSE: What do you think is causing the chest pain?      10. OTHER SYMPTOMS: Do you have any other symptoms? (e.g., dizziness, nausea, vomiting, sweating, fever, difficulty breathing, cough)        11. PREGNANCY: Is there any chance you are pregnant? When was your last menstrual period?  Protocols used: Chest Pain-A-AH

## 2024-02-22 NOTE — Progress Notes (Signed)
 "  Name: Claudia Elliott   Date of Visit: 02/22/2024   Date of last visit with me: Visit date not found   CHIEF COMPLAINT:  Chief Complaint  Patient presents with   Acute Visit    Went to ER on Monday, had chest pain, headaches, numbness and tingling in right hand. Pain scale of 4.        HPI:  Discussed the use of AI scribe software for clinical note transcription with the patient, who gave verbal consent to proceed.  History of Present Illness   Claudia Elliott is a 59 year old female who presents with chest and arm pain.  She has been experiencing chest pain and right arm pain for approximately three weeks. The pain occurs when she sleeps on her right side, leading to numbness in her arm and tingling in her hand. The pain is severe enough to wake her up at 5:30 AM, which is unusual for her.  The pain intensified on a Monday after lifting boxes the previous week, although there was no pain over the weekend. 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 She was taken to the hospital by ambulance, where she received nitroglycerin and baby aspirin. Her initial troponin test was hydrolyzed, and the second was inconclusive, but she was informed that it was normal.  She has a history of epilepsy and is currently on disability. She mentions stress related to her mother's Alzheimer's disease and her father's role as the primary caregiver. She also feels the need to return to work full-time, which adds to her stress.  She feels very hot and experiences headaches. No recent hot flashes until the onset of her current symptoms.         OBJECTIVE:       01/25/2023    3:02 PM  Depression screen PHQ 2/9  Decreased Interest 0  Down, Depressed, Hopeless 3  PHQ - 2 Score 3  Altered sleeping 3  Tired, decreased energy 3  Change in appetite 0  Feeling bad or failure about  yourself  0  Trouble concentrating 0  Moving slowly or fidgety/restless 0  Suicidal thoughts 0  PHQ-9 Score 9   Difficult doing work/chores Very difficult     Data saved with a previous flowsheet row definition     BP Readings from Last 3 Encounters:  02/22/24 130/80  02/20/24 (!) 160/97  10/05/23 124/87    BP 130/80   Pulse 67   Wt 140 lb (63.5 kg)   LMP 06/13/2014   SpO2 98%   BMI 23.12 kg/m    Physical Exam          Physical Exam Constitutional:      Appearance: Normal appearance.  Neurological:     General: No focal deficit present.     Mental Status: She is alert and oriented to person, place, and time. Mental status is at baseline.     ASSESSMENT/PLAN:   Assessment & Plan Cervical radiculitis  Hospital discharge follow-up    Assessment and Plan    Cervical radiculopathy Suspected cervical radiculopathy with unilateral numbness and tingling, likely due to cervical nerve compression. Differential diagnosis includes cardiac issues, but normal labs and vitals rule out acute cardiac conditions. Anxiety may influence symptom perception. - Ordered cervical spine x-rays at [facility]. - Prescribed steroid for inflammation and symptom relief. - Advised  against chiropractic manipulation. - Scheduled follow-up in one week to assess symptoms and review x-ray results. - Consider MRI and possible steroid injection if no improvement.         Hasheem Voland A. Vita MD [facility] "

## 2024-02-23 ENCOUNTER — Telehealth: Payer: Self-pay

## 2024-02-23 NOTE — Telephone Encounter (Signed)
 Copied from CRM [id number]. Topic: Clinical - Lab/Test Results >> Feb 23, 2024  3:38 PM Drema MATSU wrote: Reason for CRM: Patient is requesting a callback regarding imaging results.

## 2024-02-27 ENCOUNTER — Ambulatory Visit (HOSPITAL_BASED_OUTPATIENT_CLINIC_OR_DEPARTMENT_OTHER): Payer: Self-pay | Admitting: Obstetrics & Gynecology

## 2024-02-29 ENCOUNTER — Ambulatory Visit: Admitting: Family Medicine

## 2024-02-29 ENCOUNTER — Encounter: Payer: Self-pay | Admitting: Family Medicine

## 2024-02-29 VITALS — BP 130/80 | HR 64 | Wt 144.4 lb

## 2024-02-29 DIAGNOSIS — M542 Cervicalgia: Secondary | ICD-10-CM | POA: Diagnosis not present

## 2024-02-29 DIAGNOSIS — M5412 Radiculopathy, cervical region: Secondary | ICD-10-CM

## 2024-02-29 DIAGNOSIS — M170 Bilateral primary osteoarthritis of knee: Secondary | ICD-10-CM

## 2024-02-29 MED ORDER — MELOXICAM 15 MG PO TABS: 15.0000 mg | ORAL_TABLET | Freq: Every day | ORAL | 0 refills | Status: AC

## 2024-02-29 NOTE — Progress Notes (Signed)
 "  Name: Claudia Elliott   Date of Visit: 02/29/24   Date of last visit with me: 02/22/2024   CHIEF COMPLAINT:  Chief Complaint  Patient presents with   Follow-up    1 week follow up. Did not react well to the prednisone, nausea, shakes. Needs notes from last time she was here and today.         HPI:  Discussed the use of AI scribe software for clinical note transcription with the patient, who gave verbal consent to proceed.  History of Present Illness   Claudia Elliott is a 59 year old female with cervical spine degeneration who presents with neck pain and numbness.  She experiences persistent neck pain and numbness. Prednisone was previously prescribed, which alleviated numbness but caused intolerable side effects such as hand tremors, nausea, and severe headaches, impacting her ability to work and perform daily activities.  She has persistent pain in her back and shoulders, initially attributed to knee issues due to bone-on-bone contact in both knees. The numbness has resolved. She has a history of taking gabapentin  for nerve pain and prefers meloxicam  for its once-daily dosing and tolerability.  She has a history of knee osteoarthritis with bone-on-bone contact, and previous knee injections are no longer effective. She used to work in manufacturing systems engineer, selling medications like Celebrex and Vextra. No current numbness is reported, but there is persistent pain in the back and shoulders.         OBJECTIVE:       01/25/2023    3:02 PM  Depression screen PHQ 2/9  Decreased Interest 0  Down, Depressed, Hopeless 3  PHQ - 2 Score 3  Altered sleeping 3  Tired, decreased energy 3  Change in appetite 0  Feeling bad or failure about yourself  0  Trouble concentrating 0  Moving slowly or fidgety/restless 0  Suicidal thoughts 0  PHQ-9 Score 9   Difficult doing work/chores Very difficult     Data saved with a previous flowsheet row definition     BP Readings from Last 3  Encounters:  02/29/24 130/80  02/22/24 130/80  02/20/24 (!) 160/97    BP 130/80   Pulse 64   Wt 144 lb 6.4 oz (65.5 kg)   LMP 06/13/2014   SpO2 97%   BMI 23.85 kg/m    Physical Exam          Physical Exam Constitutional:      Appearance: Normal appearance.  Neurological:     General: No focal deficit present.     Mental Status: She is alert and oriented to person, place, and time. Mental status is at baseline.     ASSESSMENT/PLAN:   Assessment & Plan Cervical radiculitis  Cervical pain  Primary osteoarthritis of both knees    Assessment and Plan    Cervical radiculopathy with degenerative disc disease Chronic condition with neck pain and numbness. Imaging shows degeneration and bone spurs causing compression. Symptoms not severe enough for surgery. Conservative management preferred. Steroid injections considered if oral anti-inflammatories insufficient. - Referred to physical therapy for cervical strengthening exercises. - Prescribed meloxicam  for flare-ups. - Advised on maintaining proper head positioning. - Discussed potential for steroid injections if symptoms worsen.  Bilateral knee osteoarthritis Chronic condition with significant pain. Previous treatments ineffective. Surgery necessary but she has concerns about trust in surgeons. Discussed referral to Dr. Genelle for consultation. Emphasized importance of post-surgery rehabilitation. - Consider referral to Dr. Genelle for consultation. - Advised on the  importance of rehabilitation post-surgery.         Ivon Oelkers A. Vita MD [facility] "

## 2024-03-01 ENCOUNTER — Telehealth: Payer: Self-pay | Admitting: Nurse Practitioner

## 2024-03-01 NOTE — Telephone Encounter (Signed)
 Copied from CRM #[id number]. Topic: Referral - Question >> Mar 01, 2024 12:23 PM Donna BRAVO wrote: Reason for CRM: Patient would like physical therapy referral sent to [facility].

## 2024-03-28 ENCOUNTER — Ambulatory Visit (HOSPITAL_BASED_OUTPATIENT_CLINIC_OR_DEPARTMENT_OTHER): Admitting: Orthopaedic Surgery

## 2024-04-07 ENCOUNTER — Ambulatory Visit (HOSPITAL_BASED_OUTPATIENT_CLINIC_OR_DEPARTMENT_OTHER): Admitting: Physical Therapy

## 2024-04-14 ENCOUNTER — Encounter (HOSPITAL_BASED_OUTPATIENT_CLINIC_OR_DEPARTMENT_OTHER): Payer: Self-pay | Admitting: Physical Therapy

## 2024-04-21 ENCOUNTER — Encounter (HOSPITAL_BASED_OUTPATIENT_CLINIC_OR_DEPARTMENT_OTHER): Payer: Self-pay | Admitting: Physical Therapy

## 2024-04-28 ENCOUNTER — Encounter (HOSPITAL_BASED_OUTPATIENT_CLINIC_OR_DEPARTMENT_OTHER): Payer: Self-pay | Admitting: Physical Therapy
